# Patient Record
Sex: Female | Born: 1935 | Race: White | Hispanic: No | Smoking: Never smoker
Health system: Southern US, Community
[De-identification: ages and names within clinical notes are randomized; demographics above are authoritative.]

## PROBLEM LIST (undated history)

## (undated) DIAGNOSIS — H269 Unspecified cataract: Secondary | ICD-10-CM

## (undated) DIAGNOSIS — K469 Unspecified abdominal hernia without obstruction or gangrene: Secondary | ICD-10-CM

## (undated) DIAGNOSIS — K5790 Diverticulosis of intestine, part unspecified, without perforation or abscess without bleeding: Secondary | ICD-10-CM

## (undated) DIAGNOSIS — I10 Essential (primary) hypertension: Secondary | ICD-10-CM

## (undated) DIAGNOSIS — Z8719 Personal history of other diseases of the digestive system: Secondary | ICD-10-CM

## (undated) DIAGNOSIS — K219 Gastro-esophageal reflux disease without esophagitis: Secondary | ICD-10-CM

## (undated) HISTORY — PX: TONSILLECTOMY: SUR1361

## (undated) HISTORY — PX: BLADDER SUSPENSION: SHX72

## (undated) HISTORY — PX: ABDOMINAL HYSTERECTOMY: SHX81

## (undated) HISTORY — PX: EYE SURGERY: SHX253

---

## 2012-03-24 ENCOUNTER — Observation Stay (HOSPITAL_BASED_OUTPATIENT_CLINIC_OR_DEPARTMENT_OTHER)
Admission: EM | Admit: 2012-03-24 | Discharge: 2012-03-25 | Disposition: A | Discharge status: Home / Self Care | Payer: Medicare Other | Attending: Internal Medicine | Admitting: Internal Medicine

## 2012-03-24 ENCOUNTER — Encounter (HOSPITAL_BASED_OUTPATIENT_CLINIC_OR_DEPARTMENT_OTHER): Payer: Self-pay

## 2012-03-24 ENCOUNTER — Emergency Department (HOSPITAL_BASED_OUTPATIENT_CLINIC_OR_DEPARTMENT_OTHER): Payer: Medicare Other

## 2012-03-24 DIAGNOSIS — R61 Generalized hyperhidrosis: Secondary | ICD-10-CM

## 2012-03-24 DIAGNOSIS — R079 Chest pain, unspecified: Secondary | ICD-10-CM | POA: Diagnosis present

## 2012-03-24 DIAGNOSIS — K449 Diaphragmatic hernia without obstruction or gangrene: Secondary | ICD-10-CM | POA: Insufficient documentation

## 2012-03-24 DIAGNOSIS — R0789 Other chest pain: Principal | ICD-10-CM | POA: Insufficient documentation

## 2012-03-24 DIAGNOSIS — I10 Essential (primary) hypertension: Secondary | ICD-10-CM | POA: Insufficient documentation

## 2012-03-24 DIAGNOSIS — R110 Nausea: Secondary | ICD-10-CM | POA: Diagnosis present

## 2012-03-24 DIAGNOSIS — K219 Gastro-esophageal reflux disease without esophagitis: Secondary | ICD-10-CM | POA: Insufficient documentation

## 2012-03-24 DIAGNOSIS — R55 Syncope and collapse: Secondary | ICD-10-CM | POA: Insufficient documentation

## 2012-03-24 HISTORY — DX: Chest pain, unspecified: R07.9

## 2012-03-24 HISTORY — DX: Nausea: R11.0

## 2012-03-24 HISTORY — DX: Personal history of other diseases of the digestive system: Z87.19

## 2012-03-24 HISTORY — DX: Diverticulosis of intestine, part unspecified, without perforation or abscess without bleeding: K57.90

## 2012-03-24 HISTORY — DX: Essential (primary) hypertension: I10

## 2012-03-24 HISTORY — DX: Unspecified cataract: H26.9

## 2012-03-24 HISTORY — DX: Gastro-esophageal reflux disease without esophagitis: K21.9

## 2012-03-24 HISTORY — DX: Generalized hyperhidrosis: R61

## 2012-03-24 HISTORY — DX: Unspecified abdominal hernia without obstruction or gangrene: K46.9

## 2012-03-24 LAB — BASIC METABOLIC PANEL
BUN: 17 mg/dL (ref 6–23)
CO2: 27 mEq/L (ref 19–32)
Chloride: 105 mEq/L (ref 96–112)
Creatinine, Ser: 0.9 mg/dL (ref 0.50–1.10)

## 2012-03-24 LAB — CBC
HCT: 44.5 % (ref 36.0–46.0)
Hemoglobin: 14.8 g/dL (ref 12.0–15.0)
MCV: 89.9 fL (ref 78.0–100.0)
RBC: 4.95 MIL/uL (ref 3.87–5.11)
WBC: 5 10*3/uL (ref 4.0–10.5)

## 2012-03-24 LAB — TROPONIN I: Troponin I: <0.3 ng/mL

## 2012-03-24 IMAGING — CR DG CHEST 2V
2 series · 2 of 2 positions shown · non-contrast
Comparison: None.

CLINICAL DATA: Chest pain, nausea, syncope and diaphoresis.

CHEST - 2 VIEW

[w chest pa]
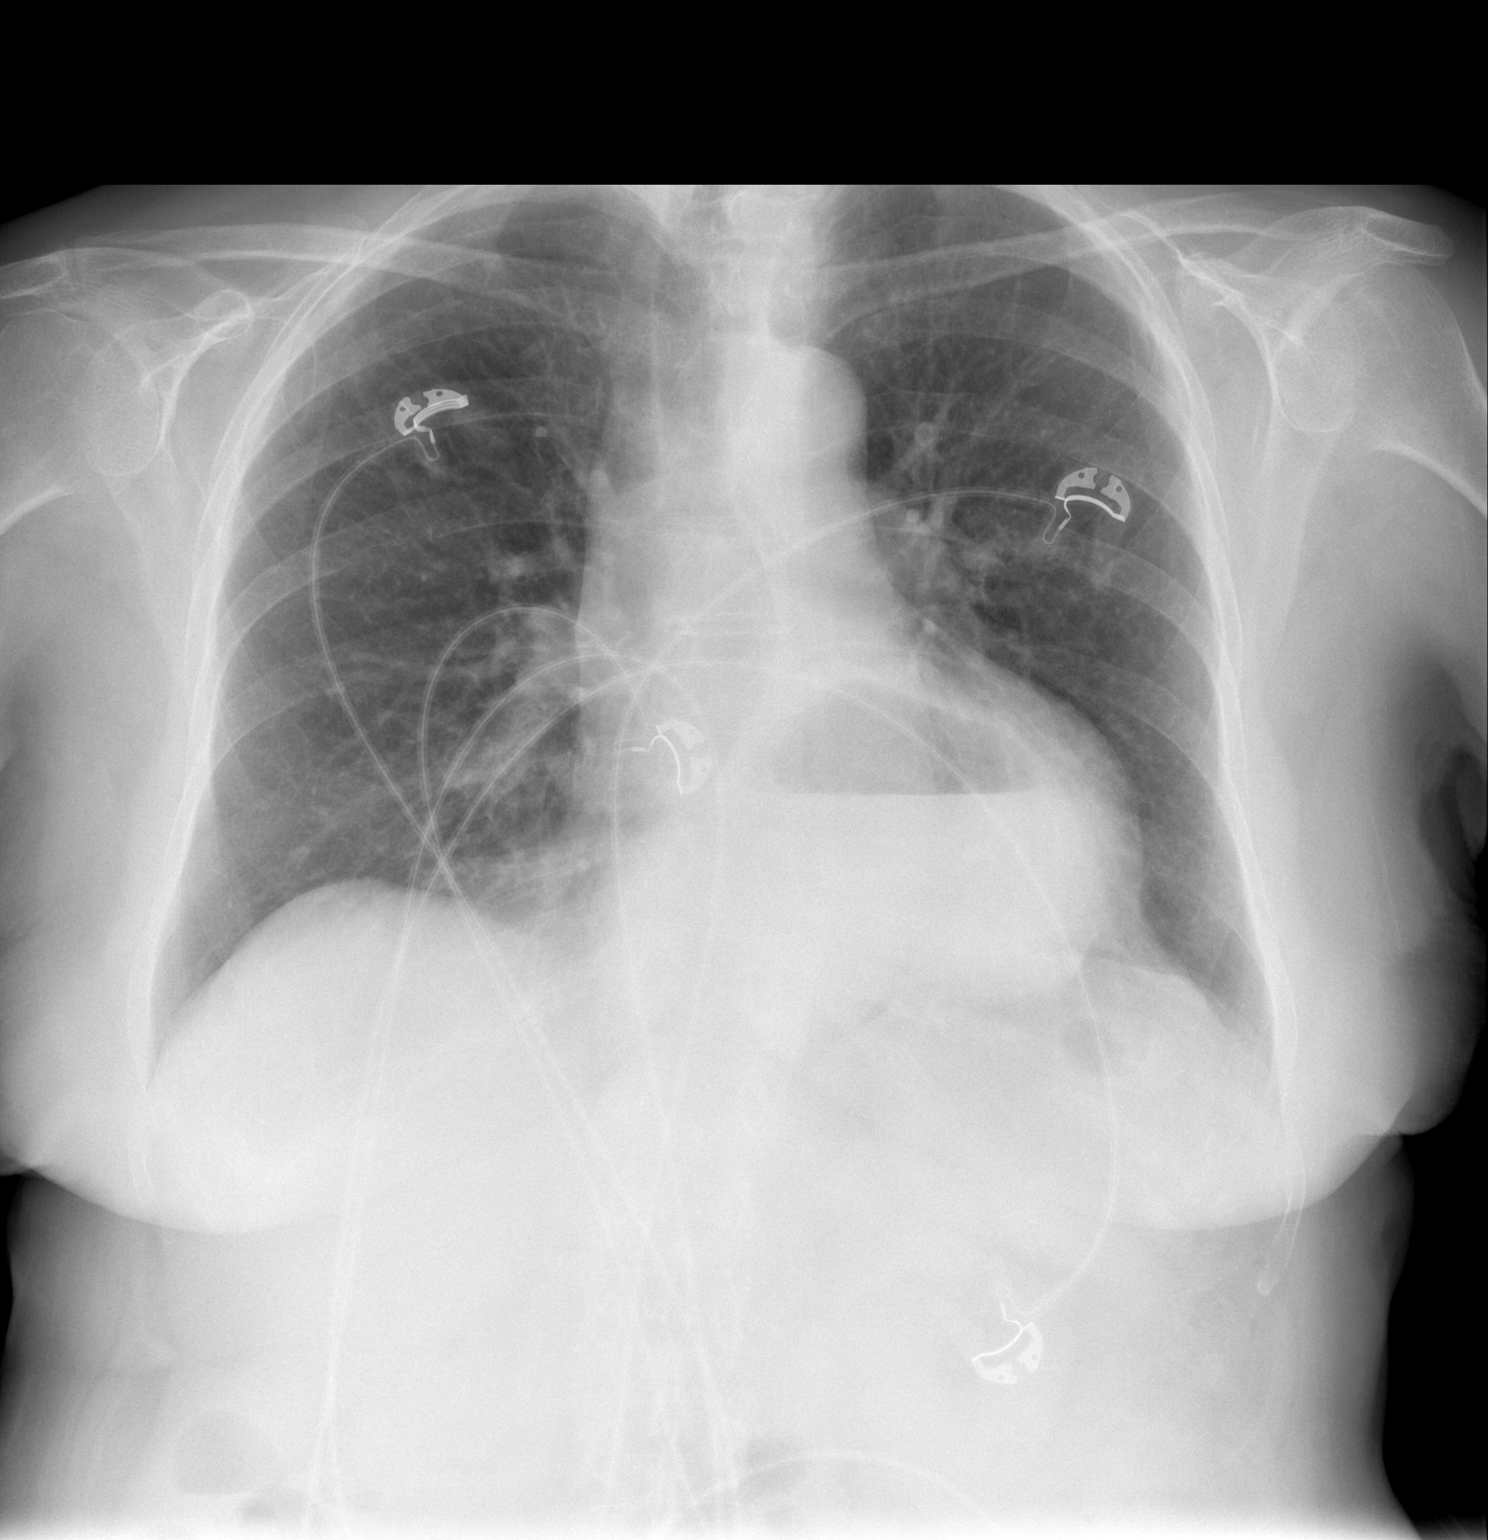

[w chest lat]
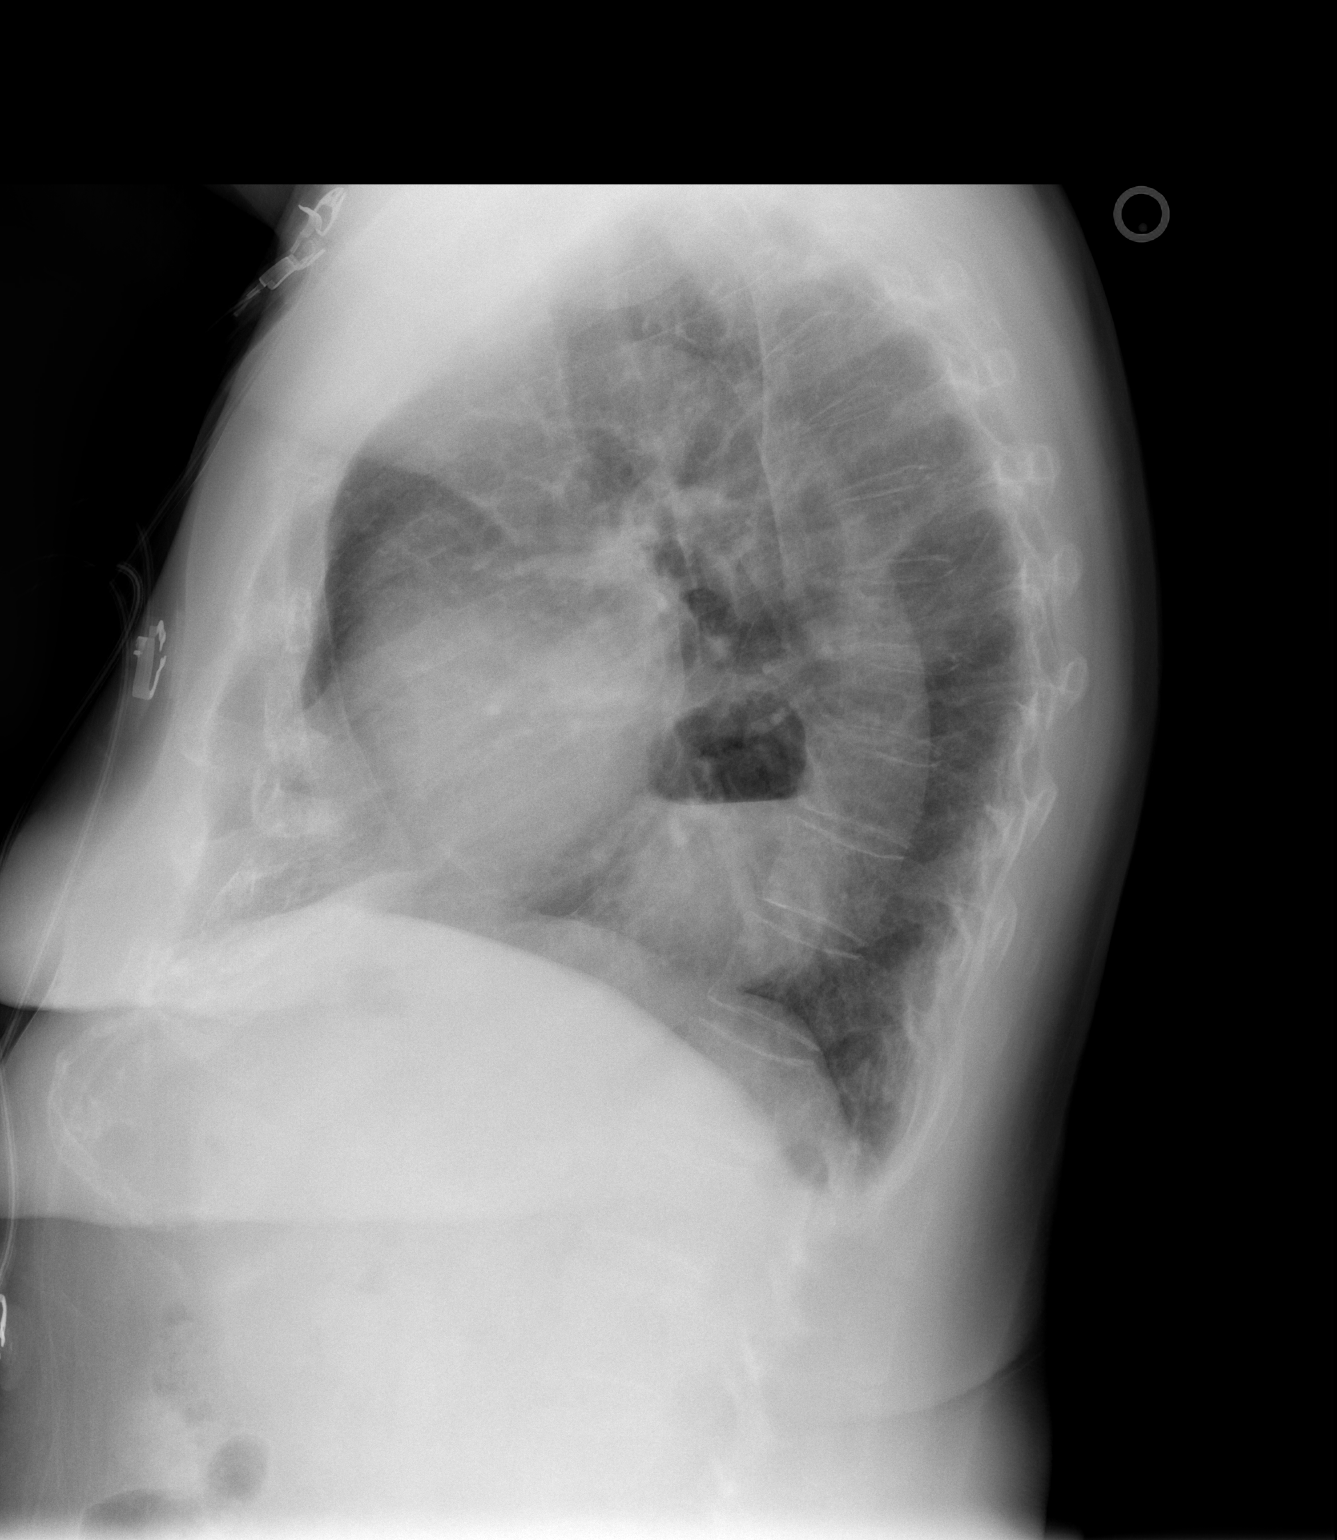

[2 of 2 positions shown; findings below may reference images not displayed]

FINDINGS: A very large hiatal hernia is present containing air
fluid levels.  Heart size is normal.  No edema, infiltrate or
pleural fluid is seen.  No pulmonary consolidation or nodules
identified.  Bony thorax is unremarkable.
IMPRESSION: Large hiatal hernia.  No active disease.

## 2012-03-24 MED ORDER — ASPIRIN EC 81 MG PO TBEC
81.0000 mg | DELAYED_RELEASE_TABLET | Freq: Every day | ORAL | Status: DC
  Administered 2012-03-25: 81 mg via ORAL
  Filled 2012-03-24 (×2): qty 1

## 2012-03-24 MED ORDER — ACETAMINOPHEN 325 MG PO TABS: 650.0000 mg | ORAL_TABLET | Freq: Four times a day (QID) | ORAL | Status: DC | PRN

## 2012-03-24 MED ORDER — PANTOPRAZOLE SODIUM 40 MG PO TBEC
40.0000 mg | DELAYED_RELEASE_TABLET | Freq: Every day | ORAL | Status: DC
  Administered 2012-03-25: 40 mg via ORAL
  Filled 2012-03-24: qty 1

## 2012-03-24 MED ORDER — ACETAMINOPHEN 650 MG RE SUPP: 650.0000 mg | Freq: Four times a day (QID) | RECTAL | Status: DC | PRN

## 2012-03-24 MED ORDER — ONDANSETRON HCL 4 MG PO TABS: 4.0000 mg | ORAL_TABLET | Freq: Four times a day (QID) | ORAL | Status: DC | PRN

## 2012-03-24 MED ORDER — ONDANSETRON HCL 4 MG/2ML IJ SOLN: 4.0000 mg | Freq: Four times a day (QID) | INTRAMUSCULAR | Status: DC | PRN

## 2012-03-24 MED ORDER — NITROGLYCERIN 0.4 MG SL SUBL: 0.4000 mg | SUBLINGUAL_TABLET | SUBLINGUAL | Status: DC | PRN

## 2012-03-24 MED ORDER — ENOXAPARIN SODIUM 40 MG/0.4ML ~~LOC~~ SOLN
40.0000 mg | SUBCUTANEOUS | Status: DC
  Administered 2012-03-24: 40 mg via SUBCUTANEOUS
  Filled 2012-03-24 (×2): qty 0.4

## 2012-03-24 NOTE — ED Notes (Signed)
Report called to [place], [place] [id number]

## 2012-03-24 NOTE — Consult Note (Signed)
Reason for Consult: Chest pain associated with diaphoresis Referring Physician: [facility] hospitalist  Stephanie Foster is an 79 y.o. female.  HPI: Patient is 79 year old female with past medical history significant for hypertension, GERD, had his hernia, history of diverticulosis, positive family history of coronary artery disease, was transferred from [facility] med center for evaluation of chest pain. Patient complains of vague chest pain described as sharp/tightness off and on lasting a few seconds to minutes associated with diaphoresis and not feeling good since last night. Patient denies any exertional chest pain denies history of PND orthopnea leg swelling also complains of occasional skipping of the heartbeat her denies any lightheadedness or syncope. EKG done in the ER showed normal sinus rhythm with poor R-wave progression in anteroseptal leads patient presently denies any chest pain states had the stress test approximately 5 years ago in [facility] which was normal. Denies any relation of chest pain to food breathing or movement.  Past Medical History  Diagnosis Date  . Hernia   . Cataract   . Diverticulosis   . Hypertension   . GERD (gastroesophageal reflux disease)   . H/O hiatal hernia     Past Surgical History  Procedure Date  . Tonsillectomy   . Bladder suspension   . Abdominal hysterectomy   . Eye surgery     Family History  Problem Relation Age of Onset  . Coronary artery disease Father   . Coronary artery disease Brother     Social History:  reports that she has never smoked. She has never used smokeless tobacco. She reports that she does not drink alcohol or use illicit drugs.  Allergies:  Allergies  Allergen Reactions  . Aspartame And Phenylalanine     "heart felt like swelling, top of head felt like blood pressure coming out of top"  . Codeine Nausea Only  . Diovan (Valsartan)     "makes me weak"  . Flagyl (Metronidazole) Diarrhea and Nausea And Vomiting  .  Macrobid (Nitrofurantoin Macrocrystal) Nausea Only  . Prednisone     "makes me feel so weak that I can't walk across the floor"  . Sulfa Antibiotics Nausea Only  . Vitasana (Actical)     "tired, arms and legs felt like going down"  . Aspirin Palpitations    Medications: I have reviewed the patient's current medications.  Results for orders placed during the hospital encounter of 03/24/12 (from the past 48 hour(s))  CBC     Status: Normal   Collection Time   03/24/12  9:20 AM      Component Value Range Comment   WBC 5.0  4.0 - 10.5 K/uL    RBC 4.95  3.87 - 5.11 MIL/uL    Hemoglobin 14.8  12.0 - 15.0 g/dL    HCT 16.1  09.6 - 04.5 %    MCV 89.9  78.0 - 100.0 fL    MCH 29.9  26.0 - 34.0 pg    MCHC 33.3  30.0 - 36.0 g/dL    RDW 40.9  81.1 - 91.4 %    Platelets 173  150 - 400 K/uL   BASIC METABOLIC PANEL     Status: Abnormal   Collection Time   03/24/12  9:20 AM      Component Value Range Comment   Sodium 143  135 - 145 mEq/L    Potassium 3.8  3.5 - 5.1 mEq/L    Chloride 105  96 - 112 mEq/L    CO2 27  19 -  32 mEq/L    Glucose, Bld 94  70 - 99 mg/dL    BUN 17  6 - 23 mg/dL    Creatinine, Ser 3.08  0.50 - 1.10 mg/dL    Calcium 9.4  8.4 - 65.7 mg/dL    GFR calc non Af Amer 57 (*) >90 mL/min    GFR calc Af Amer 66 (*) >90 mL/min   TROPONIN I     Status: Normal   Collection Time   03/24/12  9:20 AM      Component Value Range Comment   Troponin I <0.30  <0.30 ng/mL   TROPONIN I     Status: Normal   Collection Time   03/24/12  3:20 PM      Component Value Range Comment   Troponin I <0.30  <0.30 ng/mL     Dg Chest 2 View  03/24/2012  *RADIOLOGY REPORT*  Clinical Data: Chest pain, nausea, syncope and diaphoresis.  CHEST - 2 VIEW  Comparison: None.  Findings: A very large hiatal hernia is present containing air fluid levels.  Heart size is normal.  No edema, infiltrate or pleural fluid is seen.  No pulmonary consolidation or nodules identified.  Bony thorax is unremarkable.  IMPRESSION:  Large hiatal hernia.  No active disease.   Original Report Authenticated By: Irish Lack, M.D.     Review of Systems  Constitutional: Negative for fever and chills.  Eyes: Negative for blurred vision and double vision.  Respiratory: Negative for cough, hemoptysis and sputum production.   Cardiovascular: Positive for chest pain and palpitations. Negative for orthopnea, claudication and leg swelling.  Gastrointestinal: Positive for nausea. Negative for vomiting and abdominal pain.  Genitourinary: Negative for dysuria.  Skin: Negative for rash.  Neurological: Negative for dizziness and headaches.   Blood pressure 105/63, pulse 63, temperature 97.9 F (36.6 C), temperature source Oral, resp. rate 18, height 5' (1.524 m), weight 65.318 kg (144 lb), SpO2 93.00%. Physical Exam  Constitutional: She is oriented to person, place, and time. She appears well-nourished.  HENT:  Head: Normocephalic and atraumatic.  Eyes: Conjunctivae normal and EOM are normal. Left eye exhibits no discharge. No scleral icterus.  Neck: Neck supple. No JVD present. No tracheal deviation present. No thyromegaly present.  Cardiovascular: Normal rate and regular rhythm.   Murmur (Soft systolic murmur noted no S3 gallop) heard. Respiratory: Effort normal and breath sounds normal. No respiratory distress. She has no wheezes. She has no rales. She exhibits no tenderness.  GI: Soft. Bowel sounds are normal. She exhibits no distension. There is no tenderness. There is no rebound.  Musculoskeletal: She exhibits no edema and no tenderness.  Neurological: She is alert and oriented to person, place, and time.    Assessment/Plan: Atypical chest pain/associated with diaphoresis rule out MI Hypertension GERD Had this hernia History of diverticulosis Positive family history of coronary artery disease Plan Start aspirin 81 mg daily And PPI Nitrostat sublingual when necessary Will schedule for nuclear stress test in  a.m. Will hold beta blockers in view of bradycardia Stephanie Foster N 03/24/2012, 9:32 PM

## 2012-03-24 NOTE — ED Notes (Signed)
Patient transported to X-ray 

## 2012-03-24 NOTE — H&P (Signed)
Patient's PCP: Garth Schlatter, MD  Chief Complaint: Chest pain  History of Present Illness: Stephanie Foster is a 79 y.o. Caucasian female with no significant past medical history except hiatal hernia, diverticulosis, and hypertension not on any antihypertensive medications at this time who presents with the above complaints.  She reports that she intermittently has episodes of left-sided chest pain but briefly last for a few seconds which then resolves.  She had an episode of chest pain this morning at 3 a.m.  She woke up this morning and had breakfast at 8 a.m. and subsequently after that she had diaphoresis, nausea and near syncope.  She had the symptoms for about 10-15 minutes before they resolved.  She presented to the emergency department for further evaluation.  Has not had any recent fevers, chills, vomiting, shortness of breath, abdominal pain, diarrhea, headaches and vision changes.  Review of Systems: All systems reviewed with the patient and positive as per history of present illness, otherwise all other systems are negative.  Past Medical History  Diagnosis Date  . Hernia   . Cataract   . Diverticulosis   . Hypertension   . GERD (gastroesophageal reflux disease)   . H/O hiatal hernia    Past Surgical History  Procedure Date  . Tonsillectomy   . Bladder suspension   . Abdominal hysterectomy   . Eye surgery    Family History  Problem Relation Age of Onset  . Coronary artery disease Father   . Coronary artery disease Brother    History   Social History  . Marital Status: Married    Spouse Name: N/A    Number of Children: N/A  . Years of Education: N/A   Occupational History  . Not on file.   Social History Main Topics  . Smoking status: Never Smoker   . Smokeless tobacco: Never Used  . Alcohol Use: No  . Drug Use: No  . Sexually Active:    Other Topics Concern  . Not on file   Social History Narrative  . No narrative on file   Allergies: Aspartame  and phenylalanine; Codeine; Diovan; Flagyl; Macrobid; Prednisone; Sulfa antibiotics; Vitasana; and Aspirin  Home Meds: Prior to Admission medications   Medication Sig Start Date End Date Taking? Authorizing Provider  OVER THE COUNTER MEDICATION Take 1 capsule by mouth 2 (two) times daily. Total Cardio Coverage  Purchase by mail   Yes Historical Provider, MD  OVER THE COUNTER MEDICATION Take 2 Wafers by mouth at bedtime. For nervousness    Purchases  By mail  Called [facility] and anxiety   Yes Historical Provider, MD  Multiple Vitamin (MULTIVITAMIN) tablet Take 1 tablet by mouth daily.   Yes Historical Provider, MD    Physical Exam: Blood pressure 126/75, pulse 71, temperature 97.7 F (36.5 C), temperature source Oral, resp. rate 14, height 5' (1.524 m), weight 65.318 kg (144 lb), SpO2 96.00%. General: Awake, Oriented x3, No acute distress. HEENT: EOMI, Moist mucous membranes Neck: Supple CV: S1 and S2 Lungs: Clear to ascultation bilaterally Abdomen: Soft, Nontender, Nondistended, +bowel sounds. Ext: Good pulses. Trace edema. No clubbing or cyanosis noted. Neuro: Cranial Nerves II-XII grossly intact. Has 5/5 motor strength in upper and lower extremities.   Lab results:  [facility] 03/24/12 0920  NA 143  K 3.8  CL 105  CO2 27  GLUCOSE 94  BUN 17  CREATININE 0.90  CALCIUM 9.4  MG --  PHOS --   No results found for this basename: AST:2,ALT:2,ALKPHOS:2,BILITOT:2,PROT:2,ALBUMIN:2  in the last 72 hours No results found for this basename: LIPASE:2,AMYLASE:2 in the last 72 hours  Basename 03/24/12 0920  WBC 5.0  NEUTROABS --  HGB 14.8  HCT 44.5  MCV 89.9  PLT 173    Basename 03/24/12 0920  CKTOTAL --  CKMB --  CKMBINDEX --  TROPONINI <0.30   No components found with this basename: POCBNP:3 No results found for this basename: DDIMER in the last 72 hours No results found for this basename: HGBA1C:2 in the last 72 hours No results found for this basename:  CHOL:2,HDL:2,LDLCALC:2,TRIG:2,CHOLHDL:2,LDLDIRECT:2 in the last 72 hours No results found for this basename: TSH,T4TOTAL,FREET3,T3FREE,THYROIDAB in the last 72 hours No results found for this basename: VITAMINB12:2,FOLATE:2,FERRITIN:2,TIBC:2,IRON:2,RETICCTPCT:2 in the last 72 hours Imaging results:  Dg Chest 2 View  03/24/2012  *RADIOLOGY REPORT*  Clinical Data: Chest pain, nausea, syncope and diaphoresis.  CHEST - 2 VIEW  Comparison: None.  Findings: A very large hiatal hernia is present containing air fluid levels.  Heart size is normal.  No edema, infiltrate or pleural fluid is seen.  No pulmonary consolidation or nodules identified.  Bony thorax is unremarkable.  IMPRESSION: Large hiatal hernia.  No active disease.   Original Report Authenticated By: Irish Lack, M.D.    Other results: EKG: Normal sinus rhythm with heart rate of 88, with PVCs.  Assessment & Plan by Problem: Chest pain Admit the patient to telemetry.  Cycle cardiac enzymes and rule the patient out for acute coronary syndrome.  Check lipid panel in the morning.  Given patient's age, requested cardiology, Dr. Sharyn Lull, consult for further evaluation.  Patient has an allergy to aspirin.  Nausea/diaphoresis Resolved.  Prophylaxis Lovenox.  CODE STATUS Full code.  This was discussed with the patient at the time of admission.  Disposition Admit the patient to telemetry as observation.  Time spent on admission, talking to the patient, and coordinating care was: 60 mins.  Murdock Jellison A, MD 03/24/2012, 2:44 PM

## 2012-03-24 NOTE — ED Provider Notes (Signed)
History     CSN: [id number]  Arrival date & time 03/24/12  1308   First MD Initiated Contact with Patient 03/24/12 0915      Chief Complaint  Patient presents with  . Chest Pain  . Nausea    (Consider location/radiation/quality/duration/timing/severity/associated sxs/prior treatment) HPI Pt presents with c/o episode of chest pain early this morning just after getting out of bed. She describes this as a tightness, heaviness and tingling in her left chest.  This resoled after a couple of minutes.  Then after eating breakfast when she was walking across her house she had episode of nausea, diaphoresis and near syncope.  No fever/chills, no syncope. No leg swelling, no hx of DVT/PE, no recent travel/trauma/surgery.  She denies having had a stress test in the past.  There are no other associated systemic symptoms, there are no other alleviating or modifying factors.   Past Medical History  Diagnosis Date  . Hernia   . Cataract   . Diverticulosis   . Hypertension   . GERD (gastroesophageal reflux disease)   . H/O hiatal hernia     Past Surgical History  Procedure Date  . Tonsillectomy   . Bladder suspension   . Abdominal hysterectomy   . Eye surgery     Family History  Problem Relation Age of Onset  . Coronary artery disease Father   . Coronary artery disease Brother     History  Substance Use Topics  . Smoking status: Never Smoker   . Smokeless tobacco: Never Used  . Alcohol Use: No    OB History    Grav Para Term Preterm Abortions TAB SAB Ect Mult Living                  Review of Systems ROS reviewed and all otherwise negative except for mentioned in HPI  Allergies  Aspartame and phenylalanine; Codeine; Diovan; Flagyl; Macrobid; Prednisone; Sulfa antibiotics; Vitasana; and Aspirin  Home Medications  No current outpatient prescriptions on file.  BP 126/75  Pulse 71  Temp 97.7 F (36.5 C) (Oral)  Resp 14  Ht 5' (1.524 m)  Wt 144 lb (65.318 kg)  BMI  28.12 kg/m2  SpO2 96% Vitals reivewed Physical Exam Physical Examination: General appearance - alert, well appearing, and in no distress Mental status - alert, oriented to person, place, and time Eyes - no scleral icterus, no conjunctival injection Mouth - mucous membranes moist, pharynx normal without lesions Chest - clear to auscultation, no wheezes, rales or rhonchi, symmetric air entry, nontender Heart - normal rate, regular rhythm, normal S1, S2, no murmurs, rubs, clicks or gallops Abdomen - soft, nontender, nondistended, no masses or organomegaly Extremities - peripheral pulses normal, no pedal edema, no clubbing or cyanosis Skin - normal coloration and turgor, no rashes  ED Course  Procedures (including critical care time)   Date: 03/24/2012  Rate: 88  Rhythm: normal sinus rhythm with PACs and PVCs  QRS Axis: left  Intervals: normal  ST/T Wave abnormalities: nonspecific T wave changes  Conduction Disutrbances:none  Narrative Interpretation:   Old EKG Reviewed: none available  11:15 AM d/w [facility], pt to go to telemetry team 10 at [facility] Reviewed  BASIC METABOLIC PANEL - Abnormal; Notable for the following:    GFR calc non Af Amer 57 (*)     GFR calc Af Amer 66 (*)     All other components within normal limits  CBC  TROPONIN I  TROPONIN I  TROPONIN I   Dg Chest 2 View  03/24/2012  *RADIOLOGY REPORT*  Clinical Data: Chest pain, nausea, syncope and diaphoresis.  CHEST - 2 VIEW  Comparison: None.  Findings: A very large hiatal hernia is present containing air fluid levels.  Heart size is normal.  No edema, infiltrate or pleural fluid is seen.  No pulmonary consolidation or nodules identified.  Bony thorax is unremarkable.  IMPRESSION: Large hiatal hernia.  No active disease.   Original Report Authenticated By: Irish Lack, M.D.      1. Chest pain   2. Near syncope       MDM  Pt presenting with c/o chest pain as well as diaphoresis, nausea and near  syncope.  EKG and troponin are reassuring.  Pt is chest pain free in the ED.  Low suspicion for PE or CHF.  Pt to be admitted for cardiac/ACS workup.  [facility] is [facility]able [facility].          Ethelda Chick, MD 03/24/12 [phone]

## 2012-03-24 NOTE — ED Notes (Signed)
Pt reports she had an episode of chest pain after eating this am.  Pain is described as a tingling feeling on left chest wall associated with diaphoresis, nausea and near syncope.

## 2012-03-24 NOTE — ED Notes (Signed)
[facility] at bedside preparing for transport 

## 2012-03-24 NOTE — ED Notes (Signed)
Report called to [facility] °

## 2012-03-24 NOTE — ED Notes (Signed)
MD at bedside. 

## 2012-03-24 NOTE — ED Notes (Signed)
VSS. Pt denies pain.  Awaiting admission to [facility].  Pt and son informed of plan of care.

## 2012-03-24 NOTE — Progress Notes (Signed)
Received call from [facility]/ Dr Karma Ganja re: Minerva Ends for admission for chest pain.   79 year old lady with no significant past medical history came in to [facility] med center for substernal chest pain associated with some nausea, diaphoresis and an episode of near syncope in ED.  Her vitals on board appear to be stable, first set of troponin negative, EKG does not show any ischemic changes, CXR shows a large hiatal hernia without any consolidation. She is not having active chest pain.   She will be admitted to telemetry to evaluate for ACS.   Kathlen Mody, MD [phone]

## 2012-03-25 ENCOUNTER — Observation Stay (HOSPITAL_COMMUNITY): Payer: Medicare Other

## 2012-03-25 DIAGNOSIS — R110 Nausea: Secondary | ICD-10-CM

## 2012-03-25 LAB — LIPID PANEL
Cholesterol: 194 mg/dL (ref 0–200)
Total CHOL/HDL Ratio: 2.4 RATIO
Triglycerides: 73 mg/dL (ref <150)
VLDL: 15 mg/dL (ref 0–40)

## 2012-03-25 MED ORDER — TECHNETIUM TC 99M SESTAMIBI GENERIC - CARDIOLITE
30.0000 | Freq: Once | INTRAVENOUS | Status: AC | PRN
  Administered 2012-03-25: 30 via INTRAVENOUS

## 2012-03-25 MED ORDER — TECHNETIUM TC 99M SESTAMIBI GENERIC - CARDIOLITE
10.0000 | Freq: Once | INTRAVENOUS | Status: AC | PRN
  Administered 2012-03-25: 10 via INTRAVENOUS

## 2012-03-25 MED ORDER — PANTOPRAZOLE SODIUM 40 MG PO TBEC: 40.0000 mg | DELAYED_RELEASE_TABLET | Freq: Every day | ORAL | Status: DC

## 2012-03-25 MED ORDER — REGADENOSON 0.4 MG/5ML IV SOLN
0.4000 mg | Freq: Once | INTRAVENOUS | Status: AC
  Administered 2012-03-25: 0.4 mg via INTRAVENOUS
  Filled 2012-03-25: qty 5

## 2012-03-25 NOTE — Progress Notes (Signed)
Subjective:  Chest pain shortness of breath or diaphoresis. Seen a nuclear medicine Department. Tolerated Lexi scan stress test. Nuclear images are still pending  Objective:  Vital Signs in the last 24 hours: Temp:  [97.5 F (36.4 C)-97.9 F (36.6 C)] 97.5 F (36.4 C) (02/08 0500) Pulse Rate:  [63-100] 89 (02/08 1047) Resp:  [14-18] 18 (02/08 0500) BP: (105-168)/(63-81) 135/68 mmHg (02/08 1047) SpO2:  [93 %-96 %] 95 % (02/08 0500) Weight:  [63.73 kg (140 lb 8 oz)-65.318 kg (144 lb)] 63.73 kg (140 lb 8 oz) (02/08 0500)  Intake/Output from previous day: 02/07 0701 - 02/08 0700 In: -  Out: 900 [Urine:900] Intake/Output from this shift:    Physical Exam: Neck: no adenopathy, no carotid bruit, no JVD and supple, symmetrical, trachea midline Lungs: clear to auscultation bilaterally Heart: regular rate and rhythm, S1, S2 normal and Soft systolic murmur noted no S3 gallop Abdomen: soft, non-tender; bowel sounds normal; no masses,  no organomegaly Extremities: extremities normal, atraumatic, no cyanosis or edema  Lab Results:  Recent Labs  03/24/12 0920  WBC 5.0  HGB 14.8  PLT 173    Recent Labs  03/24/12 0920  NA 143  K 3.8  CL 105  CO2 27  GLUCOSE 94  BUN 17  CREATININE 0.90    Recent Labs  03/24/12 1520 03/24/12 2107  TROPONINI <0.30 <0.30   Hepatic Function Panel No results found for this basename: PROT, ALBUMIN, AST, ALT, ALKPHOS, BILITOT, BILIDIR, IBILI,  in the last 72 hours  Recent Labs  03/25/12 0538  CHOL 194   No results found for this basename: PROTIME,  in the last 72 hours  Imaging: Imaging results have been reviewed and Dg Chest 2 View  03/24/2012  *RADIOLOGY REPORT*  Clinical Data: Chest pain, nausea, syncope and diaphoresis.  CHEST - 2 VIEW  Comparison: None.  Findings: A very large hiatal hernia is present containing air fluid levels.  Heart size is normal.  No edema, infiltrate or pleural fluid is seen.  No pulmonary consolidation or  nodules identified.  Bony thorax is unremarkable.  IMPRESSION: Large hiatal hernia.  No active disease.   Original Report Authenticated By: Irish Lack, M.D.     Cardiac Studies:  Assessment/Plan:  Status post Atypical chest pain/associated with diaphoresis MI ruled out Hypertension  GERD  Had this hernia  History of diverticulosis  Positive family history of coronary artery disease Plan Continue present management Okay to discharge from cardiac point of view if nuclear scan is negative for reversible ischemia  LOS: 1 day    Stephanie Foster N 03/25/2012, 11:03 AM

## 2012-03-25 NOTE — Discharge Summary (Signed)
Physician Discharge Summary  Stephanie Foster YQM:[id number] DOB: 07-23-1924 DOA: 03/24/2012  PCP: Garth Schlatter, MD  Admit date: 03/24/2012 Discharge date: 03/25/2012  Time spent: 25 minutes  Recommendations for Outpatient Follow-up:  Home with PCP follow up in 1 week  Discharge Diagnoses:  Principal Problem:   Chest pain, atypical   Active Problems:   Nausea   Diaphoresis Hiatal hernia   Discharge Condition: fair  Diet recommendation regular  Filed Weights   03/24/12 0905 03/24/12 1349 03/25/12 0500  Weight: 63.504 kg (140 lb) 65.318 kg (144 lb) 63.73 kg (140 lb 8 oz)    History of present illness:  Please refer to H&P for  details but in brief, 79 y.o. Caucasian female with no significant past medical history except hiatal hernia, diverticulosis, and hypertension not on any antihypertensive medications at this time who reports that she intermittently has episodes of left-sided chest pain but briefly last for a few seconds which then resolves. She had an episode of chest pain on the morning of admission. She woke up this morning and had breakfast at 8 a.m. and subsequently after that she had diaphoresis, nausea and near syncope. She had the symptoms for about 10-15 minutes before they resolved. She presented to the emergency department for further evaluation   Hospital Course:  Patient admitted under observation for chest pain rule out. EKG and serial troponin were negative. Patient monitored on telemetry which was unremarkable. Placed her on ASA and s/l nitro.  Given positive cardiac hx cardiology was consulted who recommended for a stress test. patient had a nuclear stress test today which was negative for reversble  ischemia or wall motion abnormality with an EF of 63%. patient remains pain free and liekly associated with GERD symptoms and her hiatal hernia. Will prescribe her a short course of PPI.  Can be dced home with outpt follow up with PCP.   Procedures:  NUCLEAR  STRESS TEST  Consultations:  Dr Sharyn Lull  Discharge Exam: Filed Vitals:   03/25/12 1040 03/25/12 1042 03/25/12 1045 03/25/12 1047  BP: 168/66 132/67 136/64 135/68  Pulse: 100 98 90 89  Temp:      TempSrc:      Resp:      Height:      Weight:      SpO2:        General: Elderly female in NAD HEENT: no pallor, moist oral mucosa Cardiovascular: NS1 &S2, no murmurs, rubs or gallop Respiratory: clear b/l, no added sounds Abd: soft, NT, ND, BS+ Ext: warm, no edema  CNS: AAOX3  Discharge Instructions     Medication List    TAKE these medications       multivitamin tablet  Take 1 tablet by mouth daily.     OVER THE COUNTER MEDICATION  Take 1 capsule by mouth 2 (two) times daily. Total Cardio Coverage  Purchase by mail     OVER THE COUNTER MEDICATION  Take 2 Wafers by mouth at bedtime. For nervousness    Purchases  By mail  Called [facility]     pantoprazole 40 MG tablet  Commonly known as:  PROTONIX  Take 1 tablet (40 mg total) by mouth daily with breakfast.          The results of significant diagnostics from this hospitalization (including imaging, microbiology, ancillary and laboratory) are listed below for reference.    Significant Diagnostic Studies: Dg Chest 2 View  03/24/2012  *RADIOLOGY REPORT*  Clinical Data: Chest pain, nausea,  syncope and diaphoresis.  CHEST - 2 VIEW  Comparison: None.  Findings: A very large hiatal hernia is present containing air fluid levels.  Heart size is normal.  No edema, infiltrate or pleural fluid is seen.  No pulmonary consolidation or nodules identified.  Bony thorax is unremarkable.  IMPRESSION: Large hiatal hernia.  No active disease.   Original Report Authenticated By: Irish Lack, M.D.    Nm Myocar Multi W/spect W/wall Motion / Ef  03/25/2012  *RADIOLOGY REPORT*  Clinical Data:  79 year old female with chest pain  MYOCARDIAL IMAGING WITH SPECT (REST AND PHARMACOLOGIC-STRESS) GATED LEFT VENTRICULAR WALL MOTION STUDY  LEFT VENTRICULAR EJECTION FRACTION  Technique:  Resting myocardial SPECT imaging was initially performed after intravenous administration of radiopharmaceutical. Myocardial SPECT was subsequently performed after additional radiopharmaceutical injection during pharmacologic-stress supervised by the Cardiology staff.  Quantitative gated imaging was also performed to evaluate left ventricular wall motion, and estimate left ventricular ejection fraction.  Radiopharmaceutical:  Tc-61m cardiolite at rest and during stress.  Comparison: none  Findings:  Technique: Study is adequate.  Perfusion: The heart is small.  There are no relative decreased counts on stress or rest to suggest reversible ischemia or infarction.  Wall motion:  No focal wall motion abnormality.  Normal contractility.  Left ventricular ejection fraction:  Calculated left ventricular ejection fraction =  63%  IMPRESSION:  1.  No reversible ischemia or infarction. 2.  Normal wall motion. 3.  Left ventricular ejection fraction equal 63%   Original Report Authenticated By: Genevive Bi, M.D.     Microbiology: No results found for this or any previous visit (from the past 240 hour(s)).   Labs: Basic Metabolic Panel:  Recent Labs Lab 03/24/12 0920  NA 143  K 3.8  CL 105  CO2 27  GLUCOSE 94  BUN 17  CREATININE 0.90  CALCIUM 9.4   Liver Function Tests: No results found for this basename: AST, ALT, ALKPHOS, BILITOT, PROT, ALBUMIN,  in the last 168 hours No results found for this basename: LIPASE, AMYLASE,  in the last 168 hours No results found for this basename: AMMONIA,  in the last 168 hours CBC:  Recent Labs Lab 03/24/12 0920  WBC 5.0  HGB 14.8  HCT 44.5  MCV 89.9  PLT 173   Cardiac Enzymes:  Recent Labs Lab 03/24/12 0920 03/24/12 1520 03/24/12 2107  TROPONINI <0.30 <0.30 <0.30   BNP: BNP (last 3 results) No results found for this basename: PROBNP,  in the last 8760 hours CBG: No results found  for this basename: GLUCAP,  in the last 168 hours     Signed:  Vendetta Pittinger  [facility] 03/25/2012, 1:08 PM

## 2015-12-14 ENCOUNTER — Emergency Department (HOSPITAL_BASED_OUTPATIENT_CLINIC_OR_DEPARTMENT_OTHER)
Admission: EM | Admit: 2015-12-14 | Discharge: 2015-12-14 | Disposition: A | Discharge status: Home / Self Care | Payer: Medicare Other | Attending: Emergency Medicine | Admitting: Emergency Medicine

## 2015-12-14 ENCOUNTER — Encounter (HOSPITAL_BASED_OUTPATIENT_CLINIC_OR_DEPARTMENT_OTHER): Payer: Self-pay | Admitting: Emergency Medicine

## 2015-12-14 DIAGNOSIS — I10 Essential (primary) hypertension: Secondary | ICD-10-CM | POA: Diagnosis not present

## 2015-12-14 DIAGNOSIS — R300 Dysuria: Secondary | ICD-10-CM | POA: Insufficient documentation

## 2015-12-14 LAB — URINALYSIS, ROUTINE W REFLEX MICROSCOPIC
Bilirubin Urine: NEGATIVE
GLUCOSE, UA: NEGATIVE mg/dL
HGB URINE DIPSTICK: NEGATIVE
Ketones, ur: NEGATIVE mg/dL
Leukocytes, UA: NEGATIVE
Nitrite: NEGATIVE
PH: 6 (ref 5.0–8.0)
PROTEIN: NEGATIVE mg/dL
Specific Gravity, Urine: 1.003 — ABNORMAL LOW (ref 1.005–1.030)

## 2015-12-14 NOTE — ED Provider Notes (Signed)
[facility] Provider Note   CSN: [id number] Arrival date & time: 12/14/15  1110     History   Chief Complaint Chief Complaint  Patient presents with  . Dysuria    HPI Stephanie Foster is a 82 y.o. female With a past medical history significant for diverticulitis and GERD who presents with dysuria. Patient reports that she had a urinary tract infection many years ago with dysuria and thinks this feel similar. She said she said dysuria for the last few days but denies any change in urine appearance, smell, amount, and denies any abdominal pain. She denies any vaginal discharge or bleeding. She denies any recent trauma to her back, flanks, or abdomen. She denies any falls. She denies fevers, chills, chest pain, shortness of breath, nausea, vomiting. She denies any other symptoms. She reports that she took a pill of Cipro yesterday from a friend when she thought she was feeling dysuria.  Dysuria   This is a new problem. The current episode started yesterday. The problem occurs intermittently. The problem has not changed since onset.The quality of the pain is described as burning. The pain is at a severity of 0/10. The patient is experiencing no pain. There has been no fever. Pertinent negatives include no chills, no sweats, no nausea, no vomiting, no discharge, no frequency, no hematuria, no hesitancy, no urgency and no flank pain. She has tried nothing for the symptoms. Her past medical history does not include kidney stones, single kidney or urological procedure.    Past Medical History:  Diagnosis Date  . Cataract   . Diverticulosis   . GERD (gastroesophageal reflux disease)   . H/O hiatal hernia   . Hernia   . Hypertension     Patient Active Problem List   Diagnosis Date Noted  . Chest pain 03/24/2012  . Nausea 03/24/2012  . Diaphoresis 03/24/2012    Past Surgical History:  Procedure Laterality Date  . ABDOMINAL HYSTERECTOMY    . BLADDER SUSPENSION    . EYE  SURGERY    . TONSILLECTOMY      OB History    No data available       Home Medications    Prior to Admission medications   Medication Sig Start Date End Date Taking? Authorizing Provider  Multiple Vitamin (MULTIVITAMIN) tablet Take 1 tablet by mouth daily.    Historical Provider, MD  OVER THE COUNTER MEDICATION Take 1 capsule by mouth 2 (two) times daily. Total Cardio Coverage  Purchase by mail    Historical Provider, MD  OVER THE COUNTER MEDICATION Take 2 Wafers by mouth at bedtime. For nervousness    Purchases  By mail  Called [facility]    Historical Provider, MD  pantoprazole (PROTONIX) 40 MG tablet Take 1 tablet (40 mg total) by mouth daily with breakfast. 03/25/12   Eddie North, MD    Family History Family History  Problem Relation Age of Onset  . Coronary artery disease Father   . Coronary artery disease Brother     Social History Social History  Substance Use Topics  . Smoking status: Never Smoker  . Smokeless tobacco: Never Used  . Alcohol use No     Allergies   Aspartame and phenylalanine; Codeine; Diovan [valsartan]; Flagyl [metronidazole]; Macrobid [nitrofurantoin macrocrystal]; Prednisone; Sulfa antibiotics; Vitasana [actical]; and Aspirin   Review of Systems Review of Systems  Constitutional: Negative for activity change, chills, diaphoresis, fatigue and fever.  HENT: Negative for congestion and rhinorrhea.  Eyes: Negative for visual disturbance.  Respiratory: Negative for cough, chest tightness, shortness of breath and stridor.   Cardiovascular: Negative for chest pain, palpitations and leg swelling.  Gastrointestinal: Negative for abdominal distention, abdominal pain, constipation, diarrhea, nausea and vomiting.  Genitourinary: Positive for dysuria. Negative for decreased urine volume, difficulty urinating, flank pain, frequency, hematuria, hesitancy, menstrual problem, pelvic pain, urgency, vaginal bleeding and vaginal discharge.   Musculoskeletal: Negative for back pain and neck pain.  Skin: Negative for rash and wound.  Neurological: Negative for dizziness, weakness, light-headedness, numbness and headaches.  Psychiatric/Behavioral: Negative for agitation and confusion.  All other systems reviewed and are negative.    Physical Exam Updated Vital Signs BP 158/83 (BP Location: Left Arm)   Pulse 80   Temp 97.8 F (36.6 C) (Oral)   Resp 16   Ht 5' (1.524 m)   Wt 142 lb (64.4 kg)   SpO2 97%   BMI 27.73 kg/m   Physical Exam  Constitutional: She appears well-developed and well-nourished. No distress.  HENT:  Head: Normocephalic and atraumatic.  Mouth/Throat: Oropharynx is clear and moist. No oropharyngeal exudate.  Eyes: Conjunctivae are normal.  Neck: Neck supple.  Cardiovascular: Normal rate and regular rhythm.   No murmur heard. Pulmonary/Chest: Effort normal and breath sounds normal. No respiratory distress. She has no wheezes. She exhibits no tenderness.  Abdominal: Soft. There is no tenderness.  Musculoskeletal: She exhibits no edema or tenderness.  Neurological: She is alert. She exhibits normal muscle tone.  Skin: Skin is warm and dry.  Psychiatric: She has a normal mood and affect.  Nursing note and vitals reviewed.    ED Treatments / Results  Labs (all labs ordered are listed, but only abnormal results are displayed) Labs Reviewed  URINALYSIS, ROUTINE W REFLEX MICROSCOPIC (NOT AT [facility]) - Abnormal; Notable for the following:       Result Value   Specific Gravity, Urine 1.003 (*)    All other components within normal limits  URINE CULTURE    EKG  EKG Interpretation None       Radiology No results found.  Procedures Procedures (including critical care time)  Medications Ordered in ED Medications - No data to display   Initial Impression / Assessment and Plan / ED Course  I have reviewed the triage vital signs and the nursing notes.  Pertinent labs & imaging results that  were available during my care of the patient were reviewed by me and considered in my medical decision making (see chart for details).  Clinical Course    Stephanie Foster is a 82 y.o. female With a past medical history significant for diverticulitis and GERD who presents with dysuria.  History and exam are seen above.  Patients exam completely unremarkable. Due to history of UTI and dysuria, UA was sent.   Urine shows no evidence of UTI. Culture was sent.   Suspect irritation as source of dysuria. As patient took a Cipro yesterday, any infection may have been partially treated. Will follow up on urine culture. Patient reports that she has azo to treat her dysuria at home. Patient was instructed to continue hydration, watch for systemic symptoms of UTI, take the azo, and follow up with her PCP. Patient understood return precautions as well as follow instructions. Patient discharged in good condition.   Final Clinical Impressions(s) / ED Diagnoses   Final diagnoses:  Dysuria    New Prescriptions Discharge Medication List as of 12/14/2015  1:21 PM  Clinical Impression: 1. Dysuria     Disposition: Discharge  Condition: Good  I have discussed the results, Dx and Tx plan with the pt(& family if present). He/she/they expressed understanding and agree(s) with the plan. Discharge instructions discussed at great length. Strict return precautions discussed and pt &/or family have verbalized understanding of the instructions. No further questions at time of discharge.    Discharge Medication List as of 12/14/2015  1:21 PM      Follow Up: Garth SchlatterWilliam Otis Ameen, MD [facility] [address] [place] [id number] [phone]     [facility] [address] [id number] [address] [place] [id number] [phone]  If symptoms worsen     Heide Scaleshristopher J Spiros Greenfeld, MD 12/15/15 1158

## 2015-12-14 NOTE — Discharge Instructions (Signed)
Please take your Azo at home for burning urination. If symptoms return or worsen, please [place] to the [place]. Please follow-up with your PCP in the next several days.

## 2015-12-14 NOTE — ED Triage Notes (Signed)
Painful urination and frequency x 3 days.

## 2015-12-16 LAB — URINE CULTURE: Culture: <10000 — AB

## 2017-11-16 DIAGNOSIS — Z961 Presence of intraocular lens: Secondary | ICD-10-CM

## 2017-11-16 DIAGNOSIS — H04123 Dry eye syndrome of bilateral lacrimal glands: Secondary | ICD-10-CM

## 2017-11-16 DIAGNOSIS — H353131 Nonexudative age-related macular degeneration, bilateral, early dry stage: Secondary | ICD-10-CM | POA: Insufficient documentation

## 2017-11-16 HISTORY — DX: Nonexudative age-related macular degeneration, bilateral, early dry stage: H35.3131

## 2017-11-16 HISTORY — DX: Presence of intraocular lens: Z96.1

## 2017-11-16 HISTORY — DX: Dry eye syndrome of bilateral lacrimal glands: H04.123

## 2020-02-22 ENCOUNTER — Inpatient Hospital Stay (HOSPITAL_COMMUNITY): Payer: Medicare Other

## 2020-02-22 ENCOUNTER — Encounter (HOSPITAL_COMMUNITY): Payer: Self-pay | Admitting: Emergency Medicine

## 2020-02-22 ENCOUNTER — Other Ambulatory Visit: Payer: Self-pay

## 2020-02-22 ENCOUNTER — Emergency Department (HOSPITAL_COMMUNITY): Payer: Medicare Other

## 2020-02-22 ENCOUNTER — Inpatient Hospital Stay (HOSPITAL_COMMUNITY)
Admission: EM | Admit: 2020-02-22 | Discharge: 2020-02-24 | DRG: 066 | Disposition: A | Discharge status: Home / Self Care | Payer: Medicare Other | Attending: Internal Medicine | Admitting: Internal Medicine

## 2020-02-22 DIAGNOSIS — I639 Cerebral infarction, unspecified: Secondary | ICD-10-CM

## 2020-02-22 DIAGNOSIS — Z881 Allergy status to other antibiotic agents status: Secondary | ICD-10-CM | POA: Diagnosis not present

## 2020-02-22 DIAGNOSIS — I63412 Cerebral infarction due to embolism of left middle cerebral artery: Secondary | ICD-10-CM | POA: Diagnosis not present

## 2020-02-22 DIAGNOSIS — I10 Essential (primary) hypertension: Secondary | ICD-10-CM | POA: Diagnosis present

## 2020-02-22 DIAGNOSIS — D500 Iron deficiency anemia secondary to blood loss (chronic): Secondary | ICD-10-CM | POA: Diagnosis present

## 2020-02-22 DIAGNOSIS — Z882 Allergy status to sulfonamides status: Secondary | ICD-10-CM | POA: Diagnosis not present

## 2020-02-22 DIAGNOSIS — G8321 Monoplegia of upper limb affecting right dominant side: Secondary | ICD-10-CM | POA: Diagnosis present

## 2020-02-22 DIAGNOSIS — E785 Hyperlipidemia, unspecified: Secondary | ICD-10-CM | POA: Diagnosis present

## 2020-02-22 DIAGNOSIS — I6340 Cerebral infarction due to embolism of unspecified cerebral artery: Principal | ICD-10-CM | POA: Diagnosis present

## 2020-02-22 DIAGNOSIS — H3530 Unspecified macular degeneration: Secondary | ICD-10-CM | POA: Diagnosis present

## 2020-02-22 DIAGNOSIS — E7800 Pure hypercholesterolemia, unspecified: Secondary | ICD-10-CM | POA: Diagnosis not present

## 2020-02-22 DIAGNOSIS — I493 Ventricular premature depolarization: Secondary | ICD-10-CM | POA: Diagnosis present

## 2020-02-22 DIAGNOSIS — Z91048 Other nonmedicinal substance allergy status: Secondary | ICD-10-CM

## 2020-02-22 DIAGNOSIS — Z8249 Family history of ischemic heart disease and other diseases of the circulatory system: Secondary | ICD-10-CM

## 2020-02-22 DIAGNOSIS — K5790 Diverticulosis of intestine, part unspecified, without perforation or abscess without bleeding: Secondary | ICD-10-CM | POA: Diagnosis present

## 2020-02-22 DIAGNOSIS — Z79899 Other long term (current) drug therapy: Secondary | ICD-10-CM

## 2020-02-22 DIAGNOSIS — Z20822 Contact with and (suspected) exposure to covid-19: Secondary | ICD-10-CM | POA: Diagnosis present

## 2020-02-22 DIAGNOSIS — Z885 Allergy status to narcotic agent status: Secondary | ICD-10-CM | POA: Diagnosis not present

## 2020-02-22 DIAGNOSIS — Z888 Allergy status to other drugs, medicaments and biological substances status: Secondary | ICD-10-CM

## 2020-02-22 DIAGNOSIS — I491 Atrial premature depolarization: Secondary | ICD-10-CM | POA: Diagnosis not present

## 2020-02-22 DIAGNOSIS — K219 Gastro-esophageal reflux disease without esophagitis: Secondary | ICD-10-CM | POA: Diagnosis present

## 2020-02-22 DIAGNOSIS — Z886 Allergy status to analgesic agent status: Secondary | ICD-10-CM

## 2020-02-22 DIAGNOSIS — R29701 NIHSS score 1: Secondary | ICD-10-CM | POA: Diagnosis present

## 2020-02-22 DIAGNOSIS — I6389 Other cerebral infarction: Secondary | ICD-10-CM | POA: Diagnosis not present

## 2020-02-22 DIAGNOSIS — Z87440 Personal history of urinary (tract) infections: Secondary | ICD-10-CM

## 2020-02-22 HISTORY — DX: Cerebral infarction, unspecified: I63.9

## 2020-02-22 LAB — CBC
HCT: 38.5 % (ref 36.0–46.0)
Hemoglobin: 11.1 g/dL — ABNORMAL LOW (ref 12.0–15.0)
MCH: 21.9 pg — ABNORMAL LOW (ref 26.0–34.0)
MCHC: 28.8 g/dL — ABNORMAL LOW (ref 30.0–36.0)
MCV: 76.1 fL — ABNORMAL LOW (ref 80.0–100.0)
Platelets: 183 10*3/uL (ref 150–400)
RBC: 5.06 MIL/uL (ref 3.87–5.11)
RDW: 22.5 % — ABNORMAL HIGH (ref 11.5–15.5)
WBC: 5.6 10*3/uL (ref 4.0–10.5)
nRBC: 0 % (ref 0.0–0.2)

## 2020-02-22 LAB — DIFFERENTIAL
Abs Immature Granulocytes: 0.02 10*3/uL (ref 0.00–0.07)
Basophils Absolute: 0 10*3/uL (ref 0.0–0.1)
Basophils Relative: 1 %
Eosinophils Absolute: 0 10*3/uL (ref 0.0–0.5)
Eosinophils Relative: 1 %
Immature Granulocytes: 0 %
Lymphocytes Relative: 30 %
Lymphs Abs: 1.7 10*3/uL (ref 0.7–4.0)
Monocytes Absolute: 0.5 10*3/uL (ref 0.1–1.0)
Monocytes Relative: 9 %
Neutro Abs: 3.3 10*3/uL (ref 1.7–7.7)
Neutrophils Relative %: 59 %

## 2020-02-22 LAB — COMPREHENSIVE METABOLIC PANEL
ALT: 12 U/L (ref 0–44)
AST: 18 U/L (ref 15–41)
Albumin: 3.4 g/dL — ABNORMAL LOW (ref 3.5–5.0)
Alkaline Phosphatase: 89 U/L (ref 38–126)
Anion gap: 12 (ref 5–15)
BUN: 25 mg/dL — ABNORMAL HIGH (ref 8–23)
CO2: 23 mmol/L (ref 22–32)
Calcium: 8.6 mg/dL — ABNORMAL LOW (ref 8.9–10.3)
Chloride: 107 mmol/L (ref 98–111)
Creatinine, Ser: 1.03 mg/dL — ABNORMAL HIGH (ref 0.44–1.00)
GFR, Estimated: 51 mL/min — ABNORMAL LOW (ref >60)
Glucose, Bld: 128 mg/dL — ABNORMAL HIGH (ref 70–99)
Potassium: 3.9 mmol/L (ref 3.5–5.1)
Sodium: 142 mmol/L (ref 135–145)
Total Bilirubin: 0.5 mg/dL (ref 0.3–1.2)
Total Protein: 6.3 g/dL — ABNORMAL LOW (ref 6.5–8.1)

## 2020-02-22 LAB — I-STAT CHEM 8, ED
BUN: 28 mg/dL — ABNORMAL HIGH (ref 8–23)
Calcium, Ion: 1.11 mmol/L — ABNORMAL LOW (ref 1.15–1.40)
Chloride: 108 mmol/L (ref 98–111)
Creatinine, Ser: 1 mg/dL (ref 0.44–1.00)
Glucose, Bld: 124 mg/dL — ABNORMAL HIGH (ref 70–99)
HCT: 37 % (ref 36.0–46.0)
Hemoglobin: 12.6 g/dL (ref 12.0–15.0)
Potassium: 3.8 mmol/L (ref 3.5–5.1)
Sodium: 144 mmol/L (ref 135–145)
TCO2: 25 mmol/L (ref 22–32)

## 2020-02-22 LAB — CBG MONITORING, ED: Glucose-Capillary: 116 mg/dL — ABNORMAL HIGH (ref 70–99)

## 2020-02-22 LAB — URINALYSIS, ROUTINE W REFLEX MICROSCOPIC
Bilirubin Urine: NEGATIVE
Glucose, UA: NEGATIVE mg/dL
Hgb urine dipstick: NEGATIVE
Ketones, ur: NEGATIVE mg/dL
Nitrite: POSITIVE — AB
Protein, ur: NEGATIVE mg/dL
Specific Gravity, Urine: 1.014 (ref 1.005–1.030)
pH: 6 (ref 5.0–8.0)

## 2020-02-22 LAB — ECHOCARDIOGRAM COMPLETE
Area-P 1/2: 3.48 cm2
Height: 60 in
S' Lateral: 2.3 cm
Weight: 2335.11 oz

## 2020-02-22 LAB — APTT: aPTT: 28 seconds (ref 24–36)

## 2020-02-22 LAB — PROTIME-INR
INR: 1 (ref 0.8–1.2)
Prothrombin Time: 12.9 seconds (ref 11.4–15.2)

## 2020-02-22 LAB — SARS CORONAVIRUS 2 (TAT 6-24 HRS): SARS Coronavirus 2: NEGATIVE

## 2020-02-22 IMAGING — CT CT HEAD CODE STROKE
3 series · 15 of 47 positions shown, 18 images · non-contrast
Comparison: [DATE] report and prior.

CLINICAL DATA: Code stroke.  Neuro deficit, acute, stroke suspected

EXAM:
CT HEAD WITHOUT CONTRAST
TECHNIQUE: Contiguous axial images were obtained from the base of the skull
through the vertex without intravenous contrast.

[Series 3: head 5.0 st · axial · 0.45mm/px · z∈[-132,+2]mm · 9 of 33 slices shown, 12 images]
[im 3/33  brain]
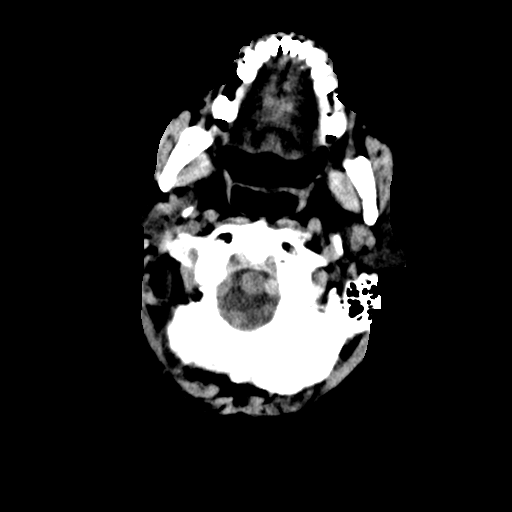
[im 3/33  bone]
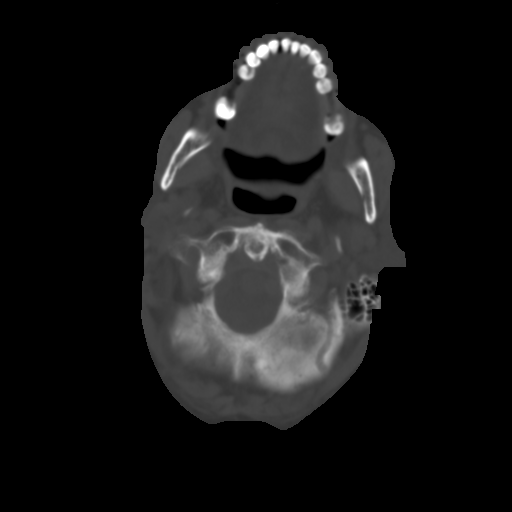
[im 6/33  brain]
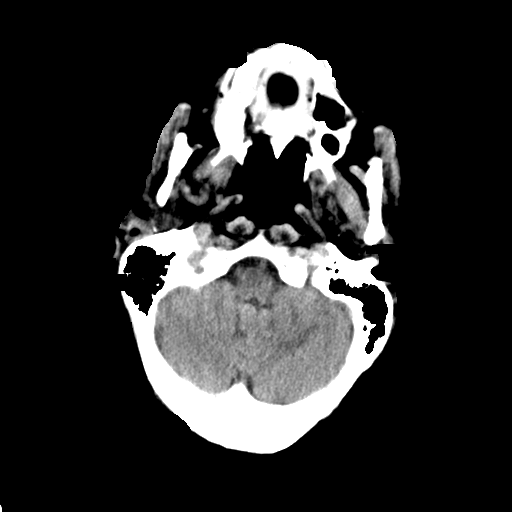
[im 9/33  brain]
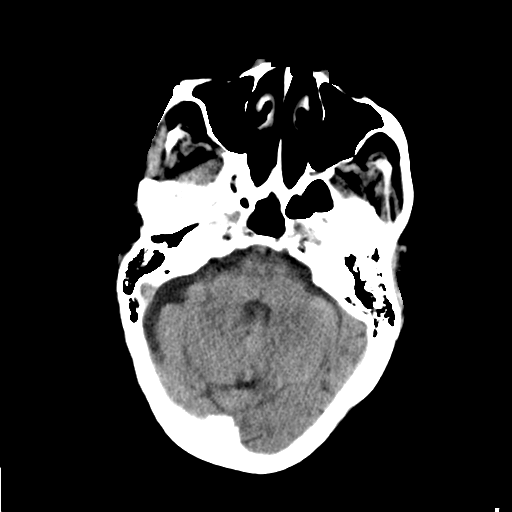
[im 13/33  brain]
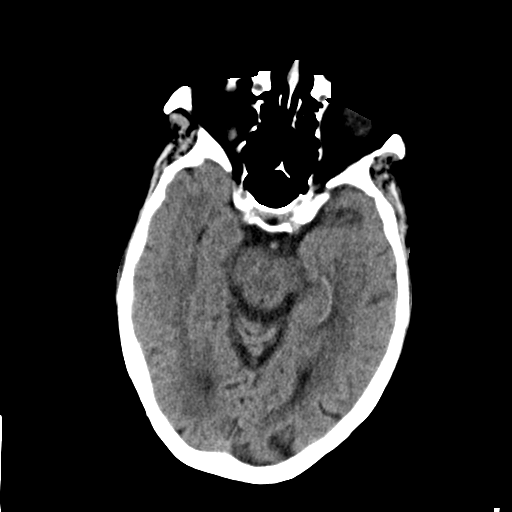
[im 17/33  brain]
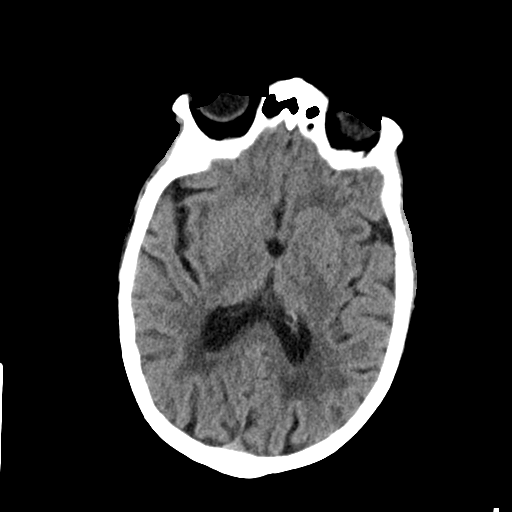
[im 17/33  bone]
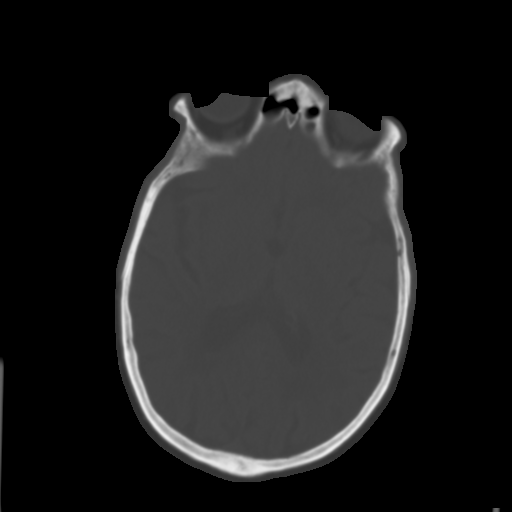
[im 20/33  brain]
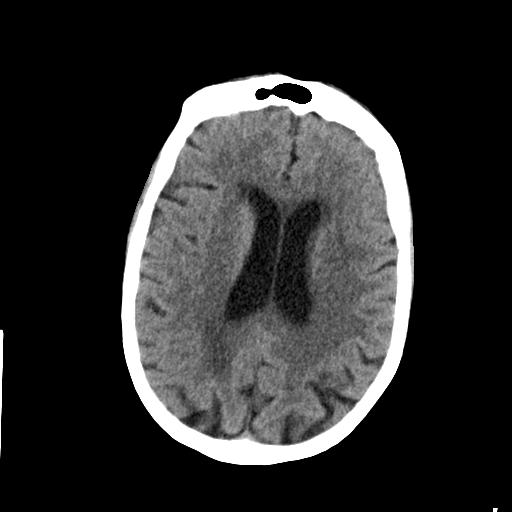
[im 24/33  brain]
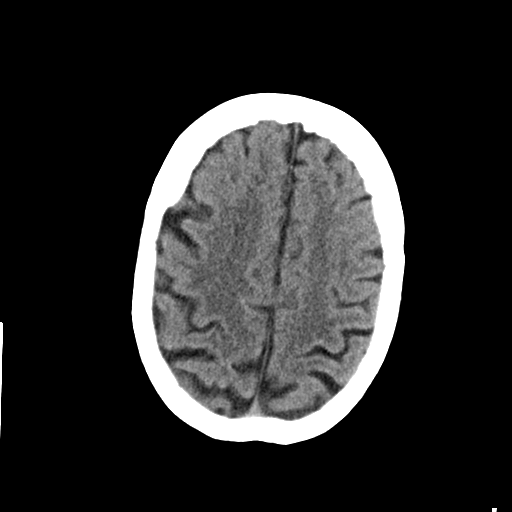
[im 27/33  brain]
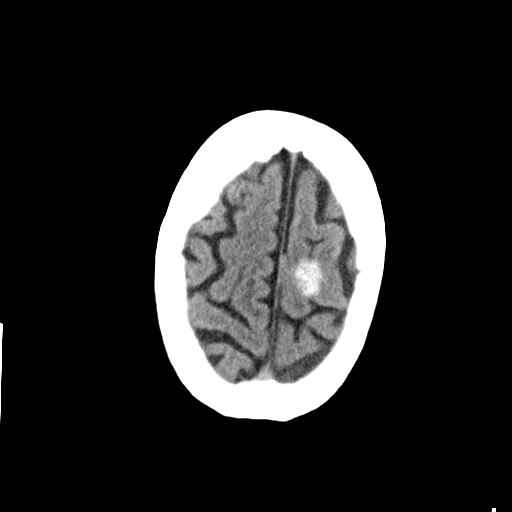
[im 30/33  brain]
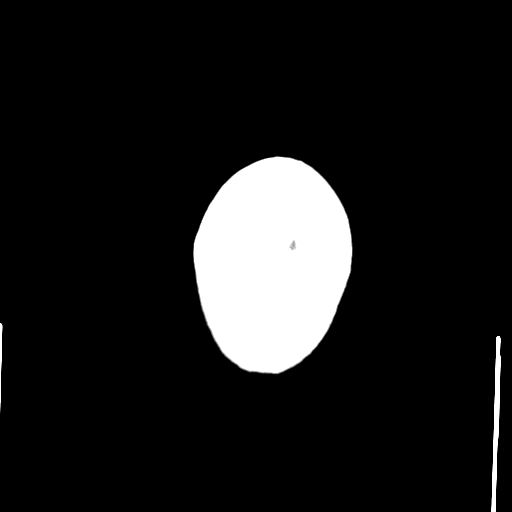
[im 30/33  bone]
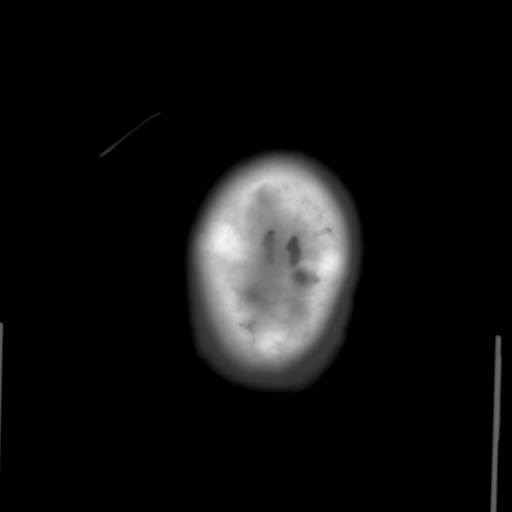

[Series 5: head 3.0 cor st · coronal · 0.34mm/px · 3 of 67 slices shown]
[im 23/67  brain]
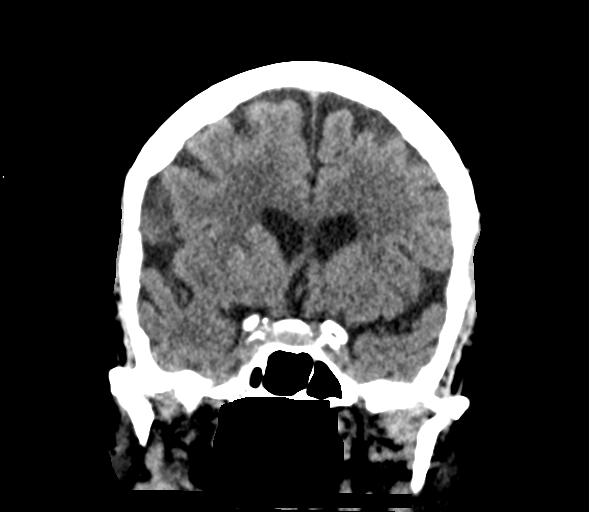
[im 30/67  brain]
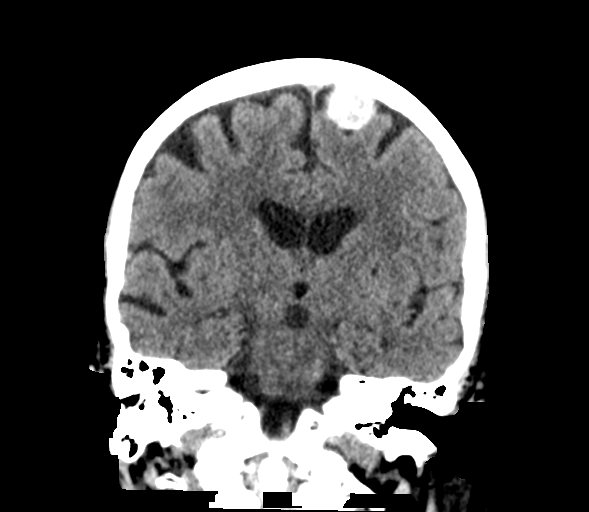
[im 37/67  brain]
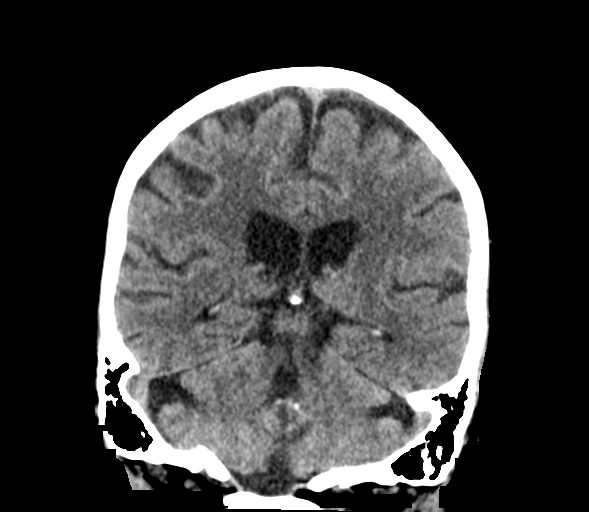

[Series 6: head 3.0 sag st · sagittal · 0.32mm/px · 3 of 58 slices shown]
[im 20/58  brain]
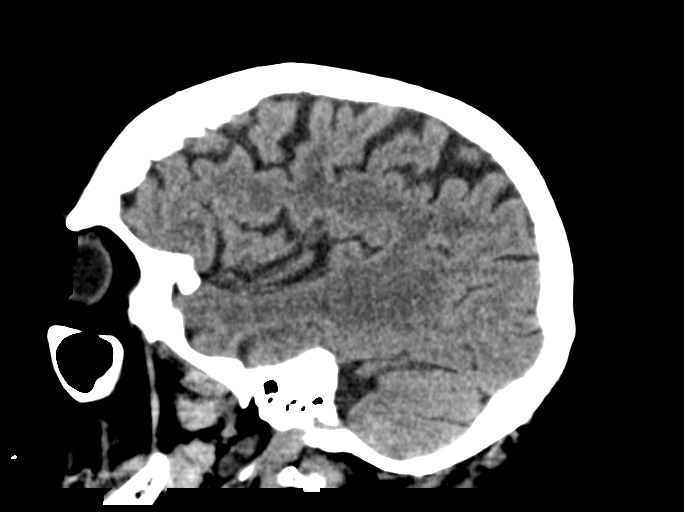
[im 29/58  brain]
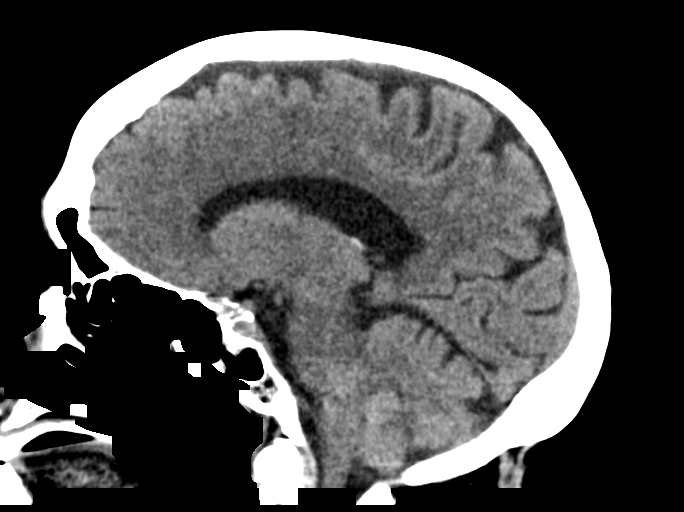
[im 39/58  brain]
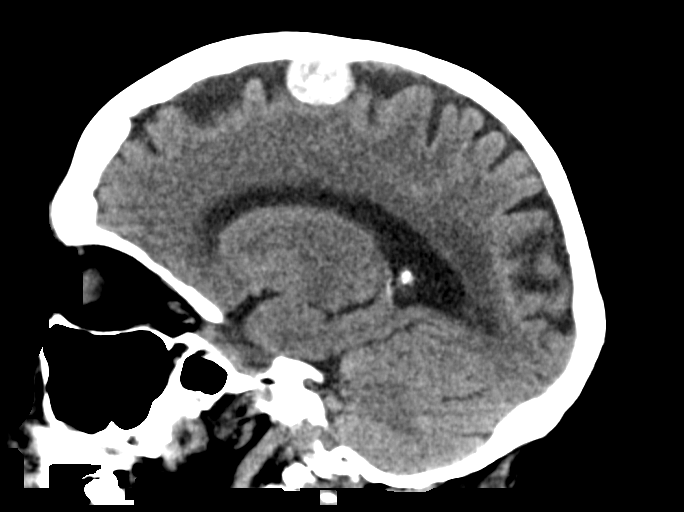

[15 of 47 positions shown; findings below may reference images not displayed]

FINDINGS: Brain: No intracranial hemorrhage. No acute infarct. Calcified left
frontal meningioma at the vertex. 0.8 cm hyperdense mass overlying
the right cerebellum. No midline shift, ventriculomegaly or
extra-axial fluid collection. Scattered and confluent supratentorial
white matter hypodense foci are nonspecific however commonly
associated with chronic microvascular ischemic changes. Mild
cerebral atrophy with ex vacuo dilatation.

Vascular: No hyperdense vessel or unexpected calcification.
Bilateral skull base atherosclerotic calcifications.

Skull: No acute finding.

Sinuses/Orbits: No acute finding.

Other: None.

ASPECTS (Alberta Stroke Program Early CT Score)

- Ganglionic level infarction (caudate, lentiform nuclei, internal
capsule, insula, M1-M3 cortex): 7

- Supraganglionic infarction (M4-M6 cortex): 3

Total score (0-10 with 10 being normal): 10
IMPRESSION: 1. No acute intracranial process.
2. ASPECTS is 10.
3. Mild cerebral atrophy and chronic microvascular ischemic changes.
4. Calcified left frontal meningioma. Subcentimeter right posterior
cranial fossa mass may reflect a smaller meningioma. Consider MRI
for further evaluation.

pm to provider Dr. FAHRI SAID Via telephone, who verbally
acknowledged these results.

## 2020-02-22 IMAGING — MR MR MRA NECK WO/W CM
8 of 9 series · 42 of 48 positions shown · IV contrast (gadavist)
Comparison: None.

CLINICAL DATA: Right upper extremity weakness

EXAM:
MRI HEAD WITHOUT CONTRAST
MRA HEAD WITHOUT CONTRAST
MRA NECK WITHOUT AND WITH CONTRAST
TECHNIQUE: Multiplanar, multiecho pulse sequences of the brain and surrounding
structures were obtained without intravenous contrast. Angiographic
images of the Circle of Willis were obtained using MRA technique
without intravenous contrast. Angiographic images of the neck were
obtained using MRA technique without and with intravenous contrast.
Carotid stenosis measurements (when applicable) are obtained
utilizing NASCET criteria, using the distal internal carotid
diameter as the denominator.
CONTRAST:  6.5mL GADAVIST GADOBUTROL 1 MMOL/ML IV SOLN

[Series 17: tof_fl3d_tra_iso · axial · 0.6mm · 0.52mm/px · z∈[-229,-147]mm · 7 of 146 slices shown]
[im 1/146]
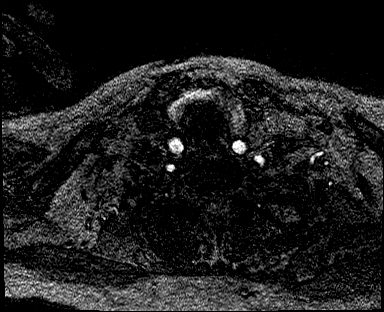
[im 25/146]
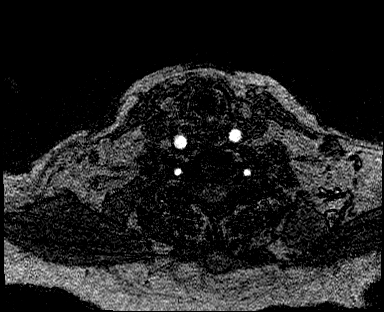
[im 49/146]
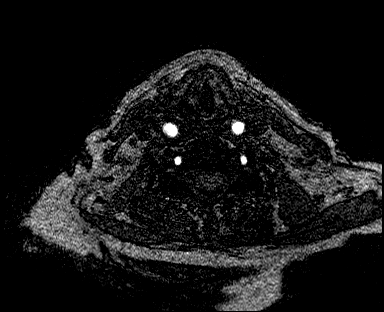
[im 73/146]
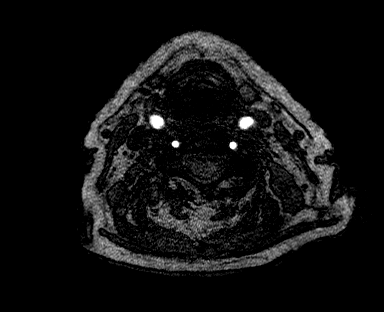
[im 97/146]
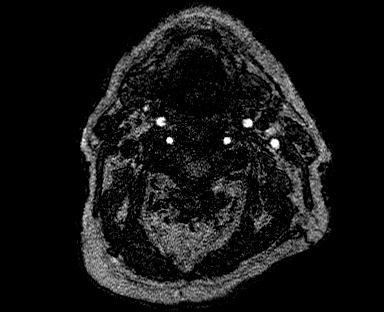
[im 121/146]
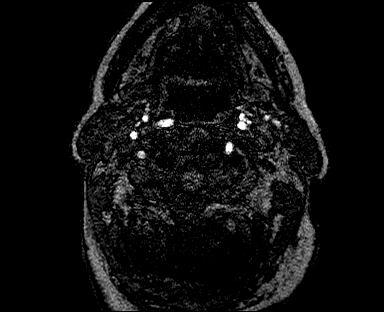
[im 146/146]
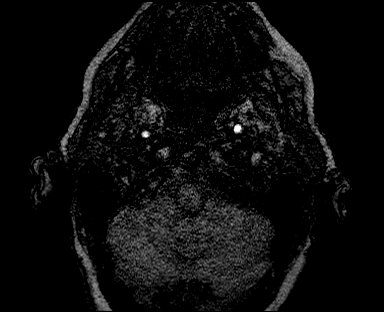

[Series 20: angio_fl3d_cor_pre_ttc=3.0s · coronal · 0.9mm · 0.85mm/px · 5 of 88 slices shown]
[im 1/88]
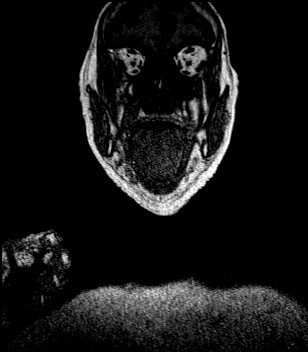
[im 22/88]
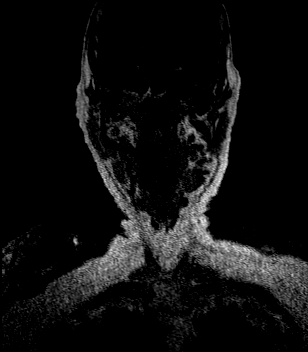
[im 44/88]
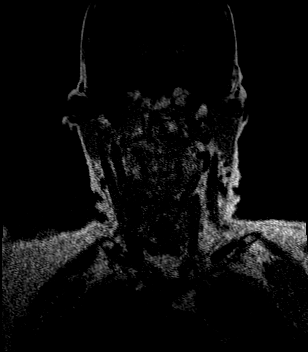
[im 66/88]
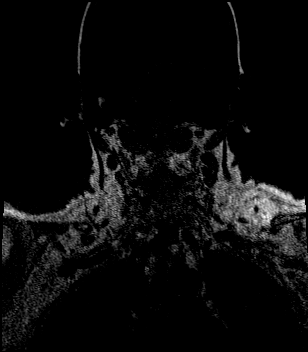
[im 88/88]
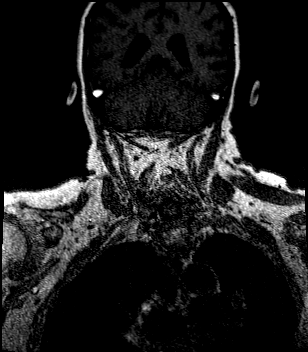

[Series 22: angio_fl3d_cor_post_ttc=3.0s · coronal · 0.9mm · 0.85mm/px · 5 of 88 slices shown (1 of 2)]
[im 1/88]
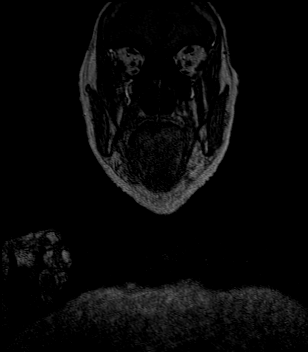
[im 22/88]
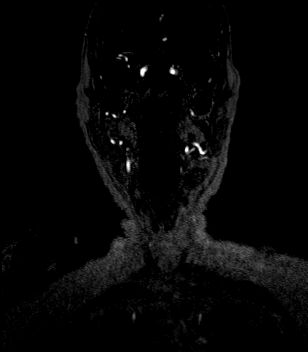
[im 44/88]
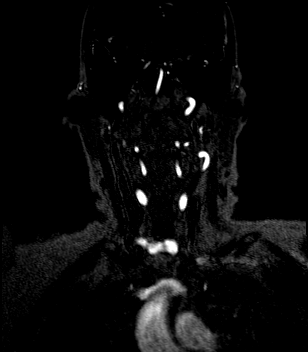
[im 66/88]
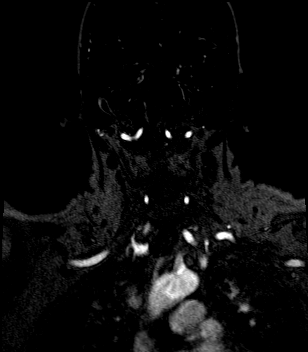
[im 88/88]
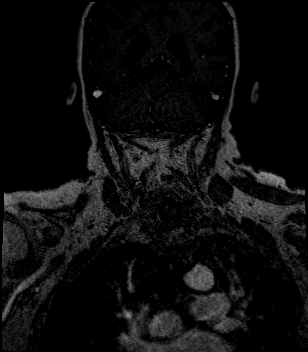

[Series 23: angio_fl3d_cor_post_ttc=3.0s_moco-adv · coronal · 0.9mm · 0.85mm/px · 5 of 88 slices shown (1 of 2)]
[im 1/88]
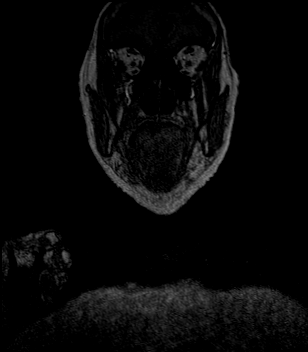
[im 22/88]
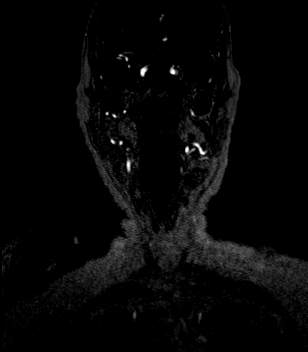
[im 44/88]
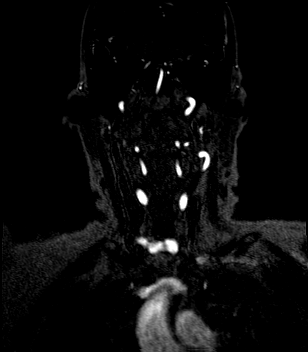
[im 66/88]
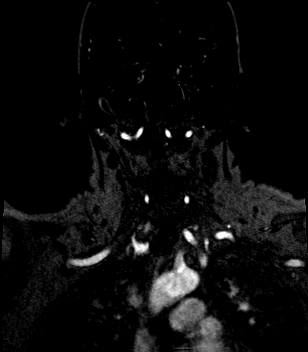
[im 88/88]
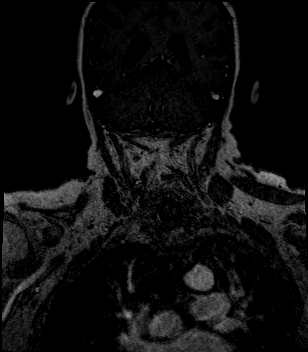

[Series 24: angio_fl3d_cor_post_ttc=3.0s_moco-adv_sub · coronal · 0.9mm · 0.85mm/px · 5 of 88 slices shown (1 of 2)]
[im 1/88]
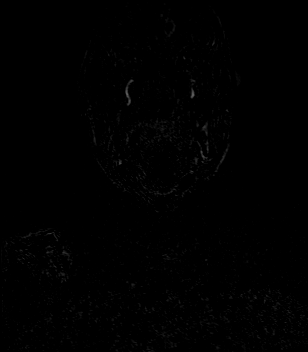
[im 22/88]
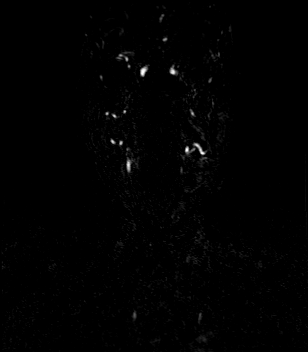
[im 44/88]
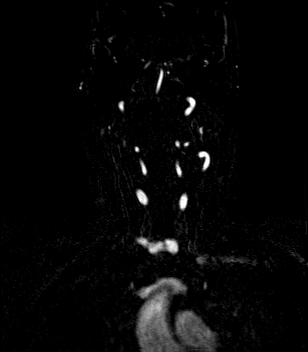
[im 66/88]
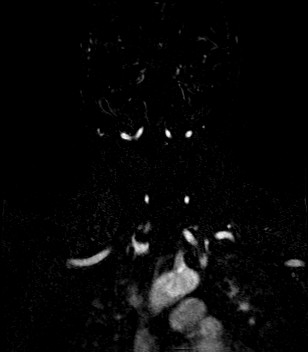
[im 88/88]
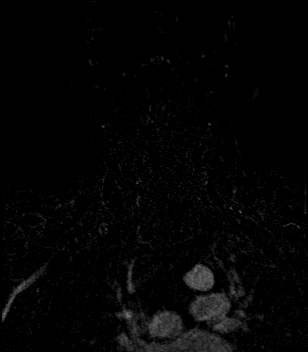

[Series 26: angio_fl3d_cor_post_ttc=3.0s · coronal · 0.9mm · 0.85mm/px · 5 of 88 slices shown (2 of 2)]
[im 1/88]
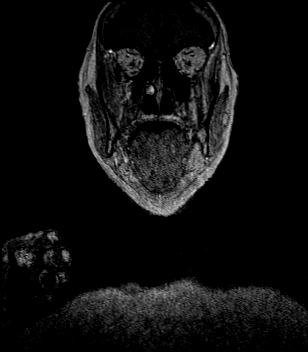
[im 22/88]
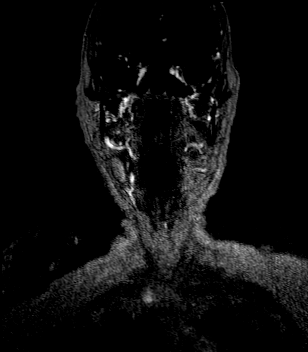
[im 44/88]
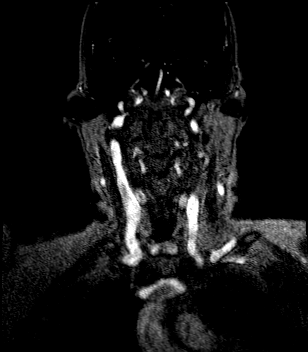
[im 66/88]
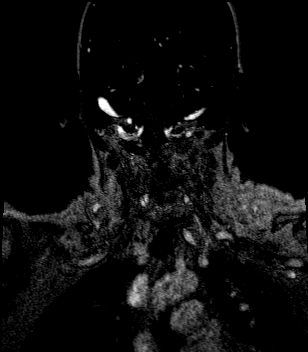
[im 88/88]
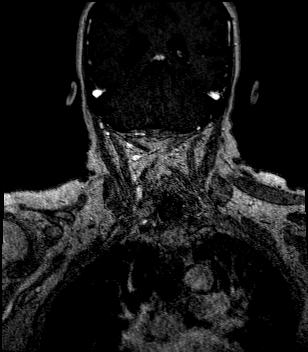

[Series 27: angio_fl3d_cor_post_ttc=3.0s_moco-adv · coronal · 0.9mm · 0.85mm/px · 5 of 88 slices shown (2 of 2)]
[im 1/88]
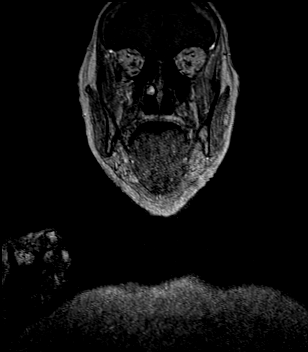
[im 22/88]
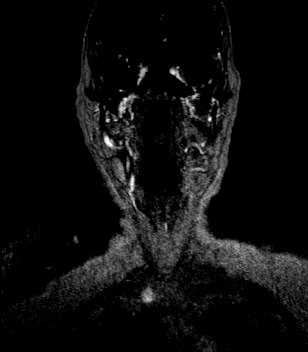
[im 44/88]
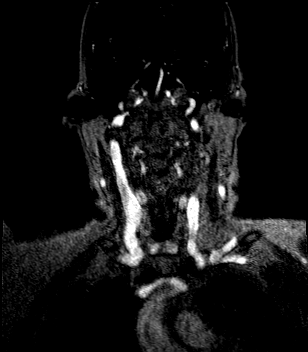
[im 66/88]
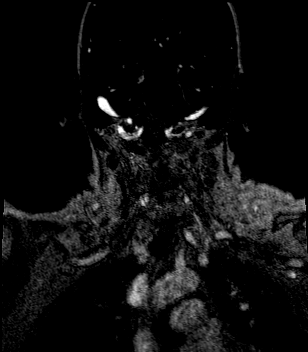
[im 88/88]
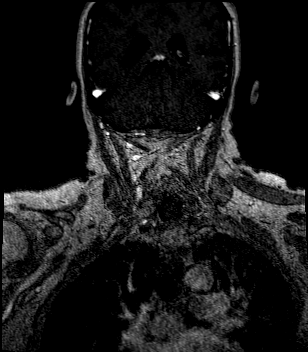

[Series 28: angio_fl3d_cor_post_ttc=3.0s_moco-adv_sub · coronal · 0.9mm · 0.85mm/px · 5 of 88 slices shown (2 of 2)]
[im 1/88]
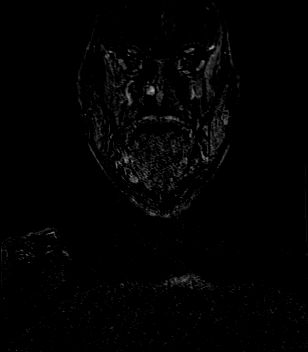
[im 22/88]
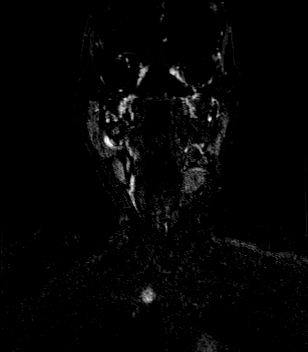
[im 44/88]
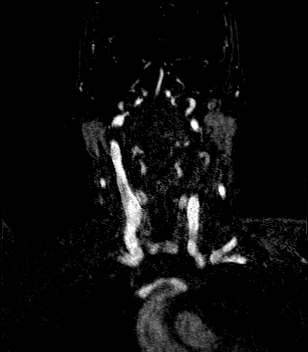
[im 66/88]
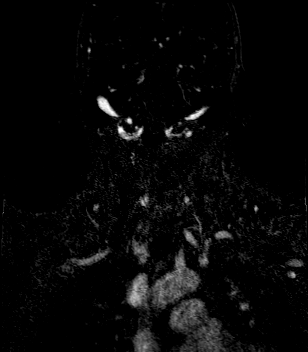
[im 88/88]
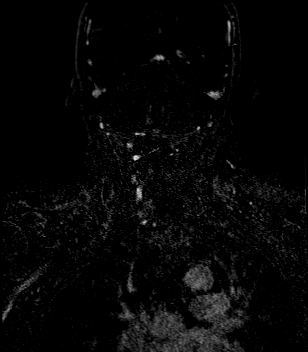

[42 of 48 positions shown; findings below may reference images not displayed]

FINDINGS: MRI HEAD

Brain: Cortical/subcortical mildly reduced diffusion in the left
perirolandic region. No evidence of intracranial hemorrhage.

Extra-axial mass along the parasagittal left frontal convexity at
the vertex measuring 2 x 1.5 cm coronally consistent with a
meningioma. This is in proximity to the superior sagittal sinus
without significant compression or invasion. Patchy and confluent
areas of T2 hyperintensity in the supratentorial white matter are
nonspecific but probably reflect moderate chronic microvascular
ischemic changes. Prominence of the ventricles and sulci reflects
mild generalized parenchymal volume loss.

No significant mass effect. There is no hydrocephalus or extra-axial
fluid collection.

Vascular: Major vessel flow voids at the skull base are preserved.

Skull and upper cervical spine: Normal marrow signal is preserved.
Degenerative changes of the cervical spine.

Sinuses/Orbits: Paranasal sinuses are aerated. Orbits are
unremarkable.

Other: Sella is unremarkable.  Mastoid air cells are clear.

MRA HEAD

Intracranial internal carotid arteries are patent. Middle and
anterior cerebral arteries are patent. Intracranial vertebral
arteries, basilar artery, posterior cerebral arteries are patent.
Left posterior communicating artery is present. There is no
significant stenosis. There is a 1-2 mm inferiorly acted outpouching
from the distal supraclinoid right ICA near the expected location of
a posterior communicating artery.

MRA NECK

Common, internal, and external carotid arteries are patent. There is
no measurable stenosis at the ICA origins. Codominant extracranial
vertebral arteries are patent without measurable stenosis or
evidence of dissection.
IMPRESSION: Small cortical/subcortical acute infarct in the left perirolandic
region. No evidence of hemorrhage.

Small left frontal convexity meningioma without parenchymal edema.

Moderate chronic microvascular ischemic changes.

No large vessel occlusion or hemodynamically significant stenosis.

1-2 mm aneurysm or infundibulum of the distal supraclinoid right
ICA.

## 2020-02-22 IMAGING — MR MR HEAD W/O CM
10 of 11 series · 43 of 48 positions shown · IV contrast (gadavist)
Comparison: None.

CLINICAL DATA: Right upper extremity weakness

EXAM:
MRI HEAD WITHOUT CONTRAST
MRA HEAD WITHOUT CONTRAST
MRA NECK WITHOUT AND WITH CONTRAST
TECHNIQUE: Multiplanar, multiecho pulse sequences of the brain and surrounding
structures were obtained without intravenous contrast. Angiographic
images of the Circle of Willis were obtained using MRA technique
without intravenous contrast. Angiographic images of the neck were
obtained using MRA technique without and with intravenous contrast.
Carotid stenosis measurements (when applicable) are obtained
utilizing NASCET criteria, using the distal internal carotid
diameter as the denominator.
CONTRAST:  6.5mL GADAVIST GADOBUTROL 1 MMOL/ML IV SOLN

[Series 5: DWI · axial · 3.0mm · 0.88mm/px · z∈[-114,+38]mm · 9 of 104 slices shown (1 of 4)]
[im 1/104]
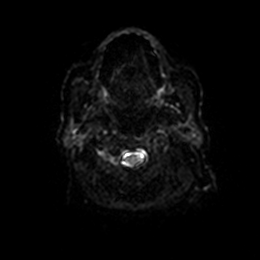
[im 13/104]
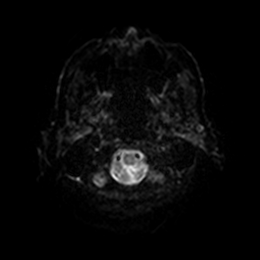
[im 26/104]
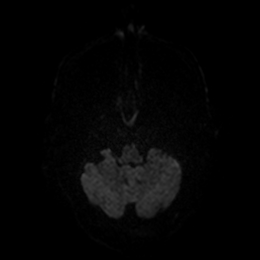
[im 39/104]
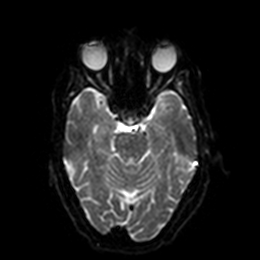
[im 52/104]
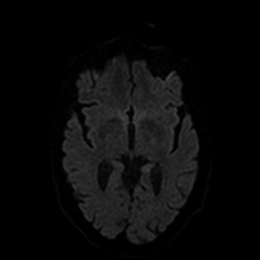
[im 65/104]
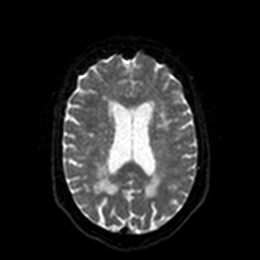
[im 78/104]
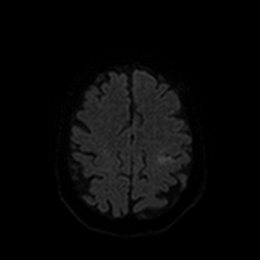
[im 91/104]
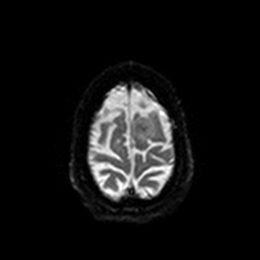
[im 104/104]
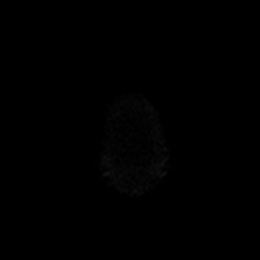

[Series 6: DWI · axial · 3.0mm · 0.88mm/px · z∈[-114,+38]mm · 5 of 52 slices shown (2 of 4)]
[im 1/52]
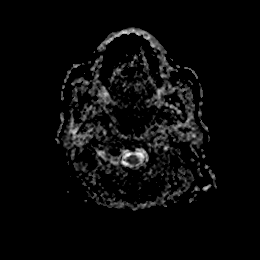
[im 13/52]
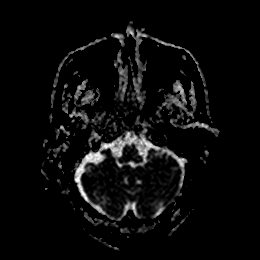
[im 26/52]
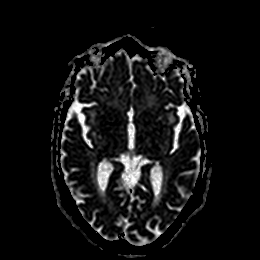
[im 39/52]
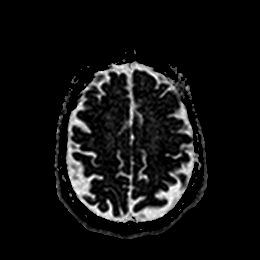
[im 52/52]
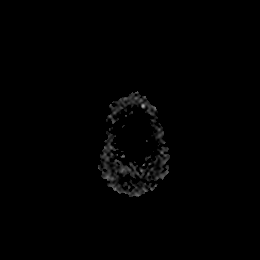

[Series 7: DWI · coronal · 4.0mm · 0.88mm/px · 7 of 72 slices shown (3 of 4)]
[im 1/72]
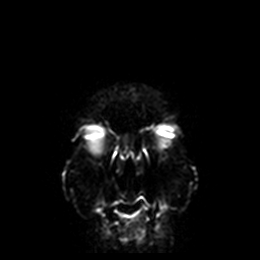
[im 12/72]
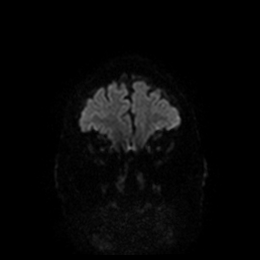
[im 24/72]
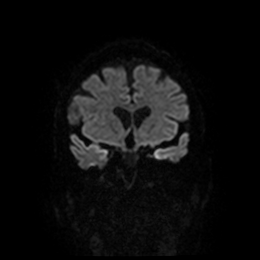
[im 36/72]
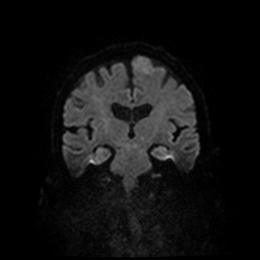
[im 48/72]
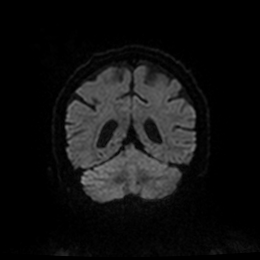
[im 60/72]
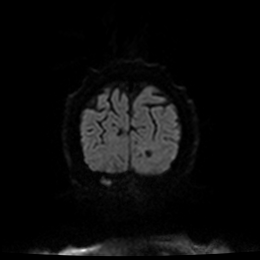
[im 72/72]
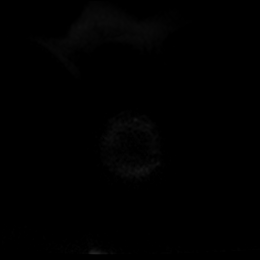

[Series 8: DWI · coronal · 4.0mm · 0.88mm/px · 3 of 36 slices shown (4 of 4)]
[im 1/36]
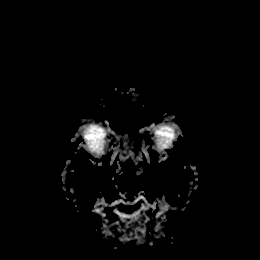
[im 18/36]
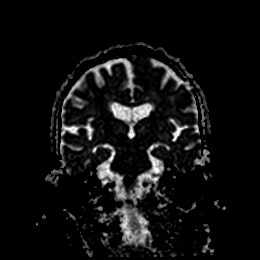
[im 36/36]
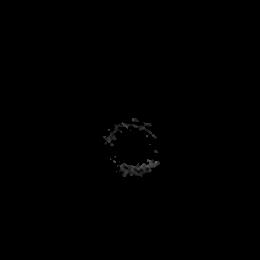

[Series 13: FLAIR · axial · 5.0mm · 0.45mm/px · z∈[-107,+36]mm · 2 of 25 slices shown]
[im 1/25]
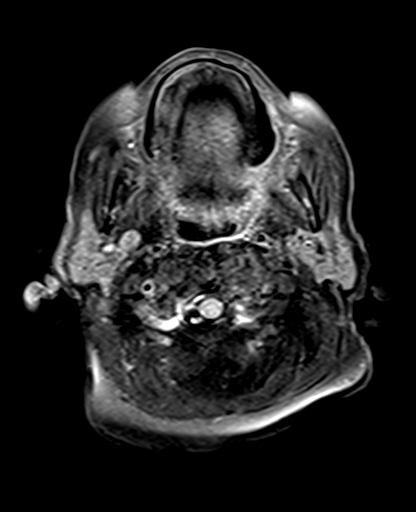
[im 25/25]
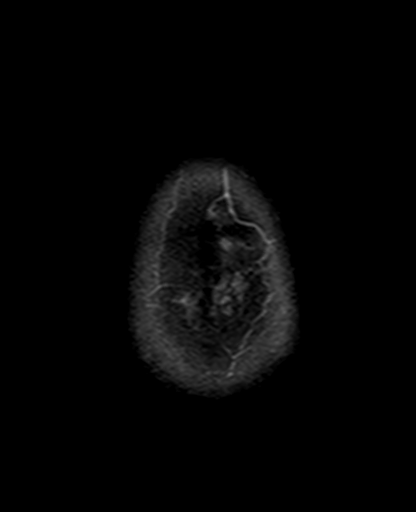

[Series 15: pha_images · axial · 3.0mm · 0.90mm/px · z∈[-112,+40]mm · 5 of 52 slices shown]
[im 1/52]
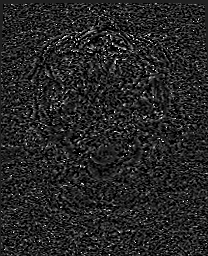
[im 13/52]
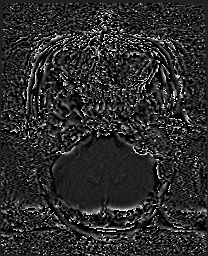
[im 26/52]
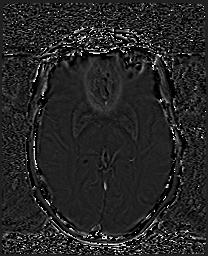
[im 39/52]
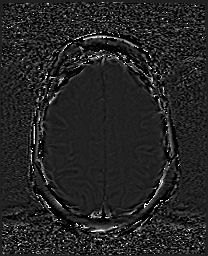
[im 52/52]
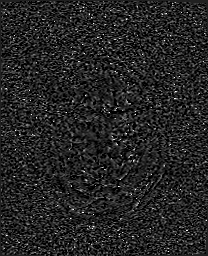

[Series 16: swi_images · axial · 3.0mm · 0.90mm/px · z∈[-112,+40]mm · 5 of 52 slices shown]
[im 1/52]
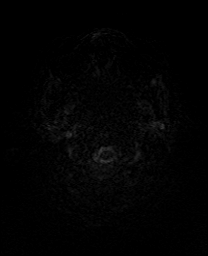
[im 13/52]
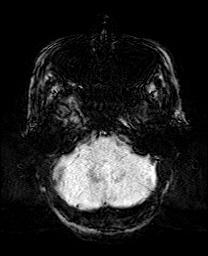
[im 26/52]
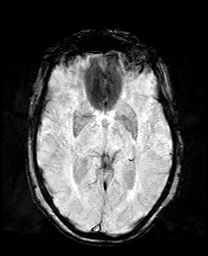
[im 39/52]
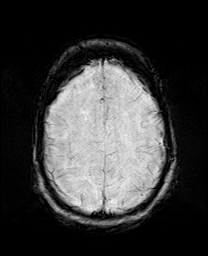
[im 52/52]
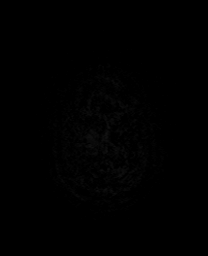

[Series 18: T1 · sagittal · 5.0mm · 0.75mm/px · 2 of 23 slices shown]
[im 1/23]
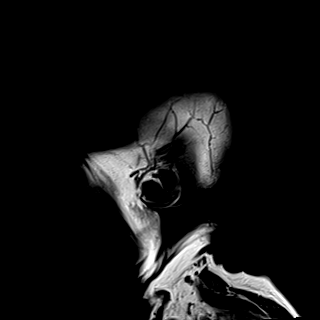
[im 23/23]
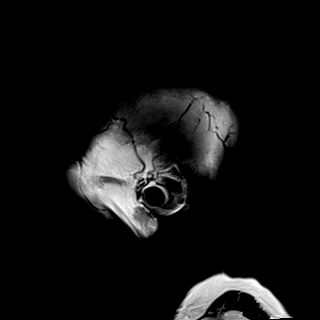

[Series 19: T2 · axial · 5.0mm · 0.72mm/px · z∈[-106,+36]mm · 2 of 25 slices shown (1 of 2)]
[im 1/25]
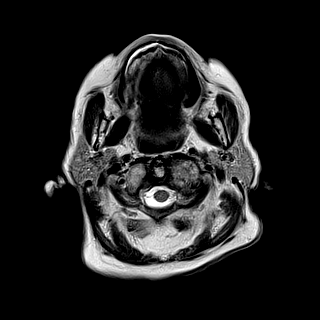
[im 25/25]
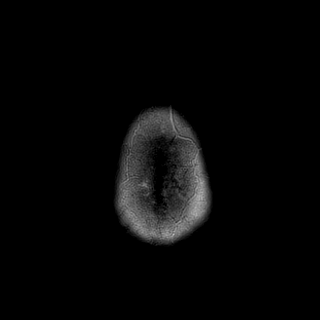

[Series 21: T2 · coronal · 5.0mm · 0.34mm/px · 3 of 30 slices shown (2 of 2)]
[im 1/30]
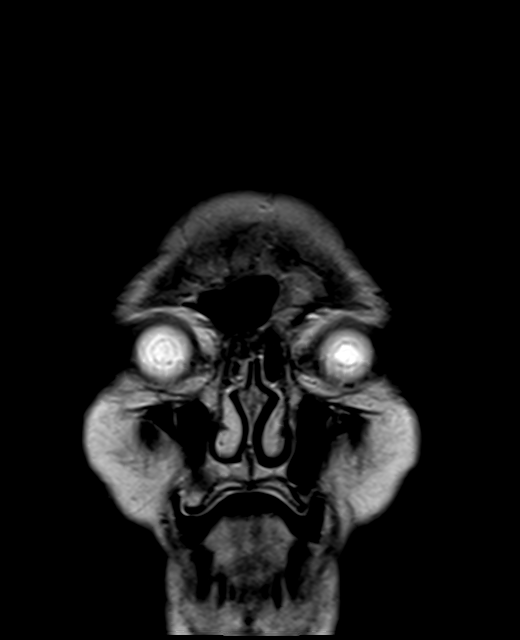
[im 15/30]
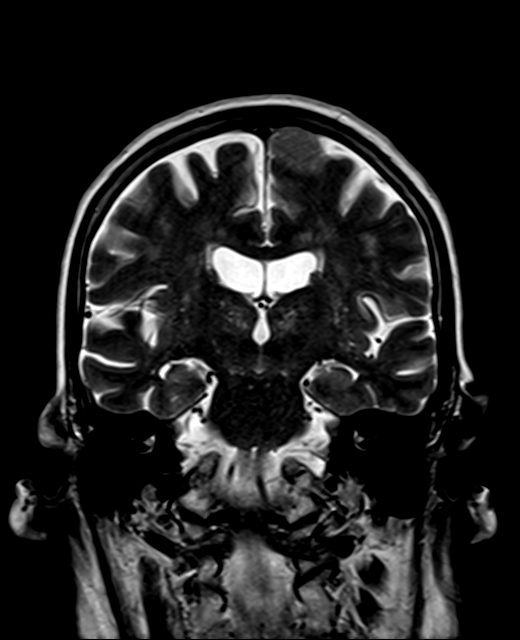
[im 30/30]
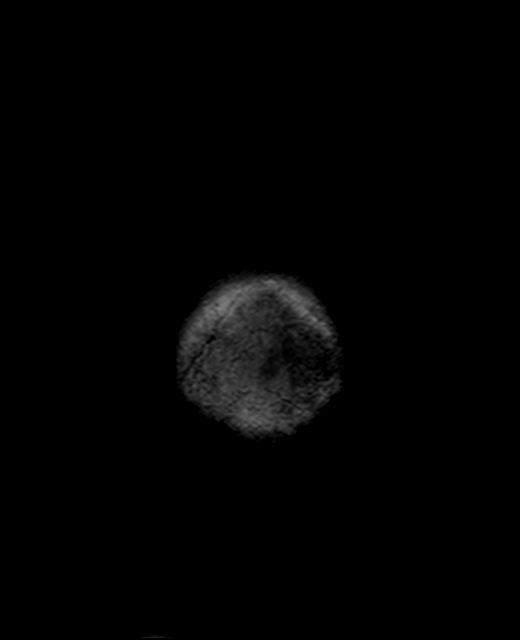

[43 of 48 positions shown; findings below may reference images not displayed]

FINDINGS: MRI HEAD

Brain: Cortical/subcortical mildly reduced diffusion in the left
perirolandic region. No evidence of intracranial hemorrhage.

Extra-axial mass along the parasagittal left frontal convexity at
the vertex measuring 2 x 1.5 cm coronally consistent with a
meningioma. This is in proximity to the superior sagittal sinus
without significant compression or invasion. Patchy and confluent
areas of T2 hyperintensity in the supratentorial white matter are
nonspecific but probably reflect moderate chronic microvascular
ischemic changes. Prominence of the ventricles and sulci reflects
mild generalized parenchymal volume loss.

No significant mass effect. There is no hydrocephalus or extra-axial
fluid collection.

Vascular: Major vessel flow voids at the skull base are preserved.

Skull and upper cervical spine: Normal marrow signal is preserved.
Degenerative changes of the cervical spine.

Sinuses/Orbits: Paranasal sinuses are aerated. Orbits are
unremarkable.

Other: Sella is unremarkable.  Mastoid air cells are clear.

MRA HEAD

Intracranial internal carotid arteries are patent. Middle and
anterior cerebral arteries are patent. Intracranial vertebral
arteries, basilar artery, posterior cerebral arteries are patent.
Left posterior communicating artery is present. There is no
significant stenosis. There is a 1-2 mm inferiorly acted outpouching
from the distal supraclinoid right ICA near the expected location of
a posterior communicating artery.

MRA NECK

Common, internal, and external carotid arteries are patent. There is
no measurable stenosis at the ICA origins. Codominant extracranial
vertebral arteries are patent without measurable stenosis or
evidence of dissection.
IMPRESSION: Small cortical/subcortical acute infarct in the left perirolandic
region. No evidence of hemorrhage.

Small left frontal convexity meningioma without parenchymal edema.

Moderate chronic microvascular ischemic changes.

No large vessel occlusion or hemodynamically significant stenosis.

1-2 mm aneurysm or infundibulum of the distal supraclinoid right
ICA.

## 2020-02-22 IMAGING — MR MR MRA HEAD W/O CM
1 series · 24 of 48 positions shown · IV contrast (gadavist)
Comparison: None.

CLINICAL DATA: Right upper extremity weakness

EXAM:
MRI HEAD WITHOUT CONTRAST
MRA HEAD WITHOUT CONTRAST
MRA NECK WITHOUT AND WITH CONTRAST
TECHNIQUE: Multiplanar, multiecho pulse sequences of the brain and surrounding
structures were obtained without intravenous contrast. Angiographic
images of the Circle of Willis were obtained using MRA technique
without intravenous contrast. Angiographic images of the neck were
obtained using MRA technique without and with intravenous contrast.
Carotid stenosis measurements (when applicable) are obtained
utilizing NASCET criteria, using the distal internal carotid
diameter as the denominator.
CONTRAST:  6.5mL GADAVIST GADOBUTROL 1 MMOL/ML IV SOLN

[Series 9: 3d cow · axial · 0.5mm · 0.41mm/px · z∈[-107,-26]mm · 24 of 172 slices shown]
[im 1/172]
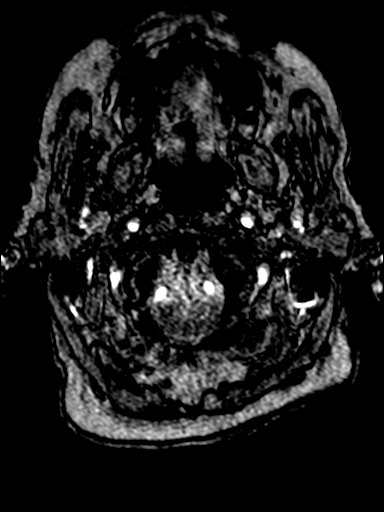
[im 4/172]
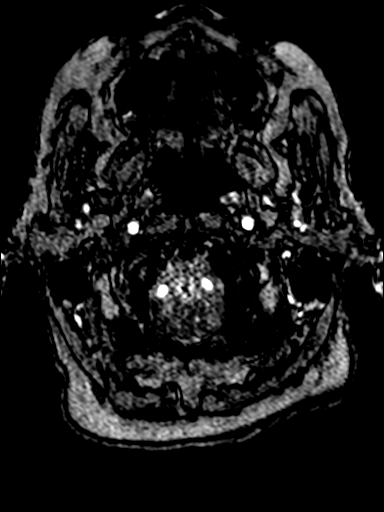
[im 8/172]
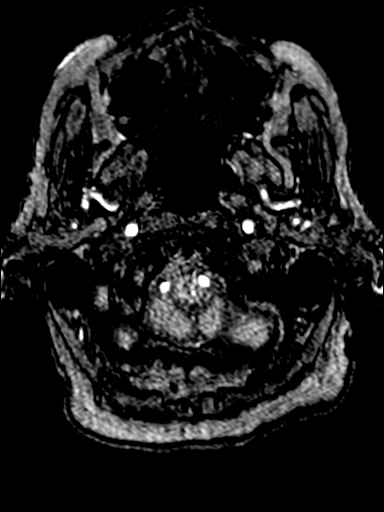
[im 11/172]
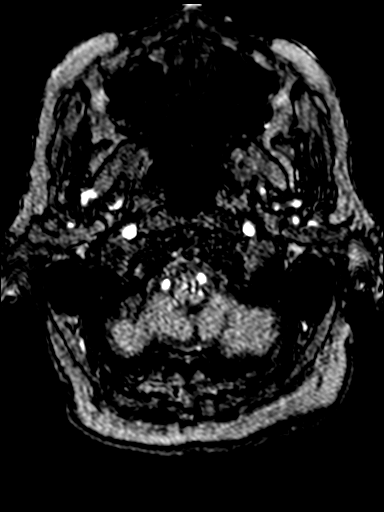
[im 15/172]
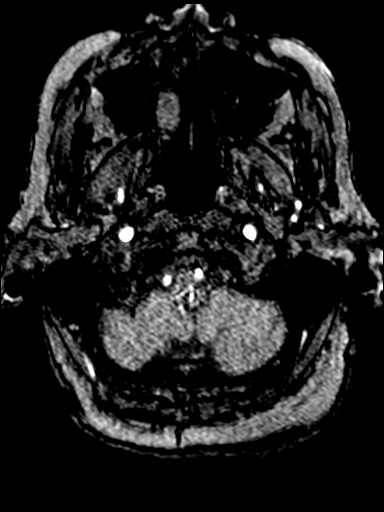
[im 19/172]
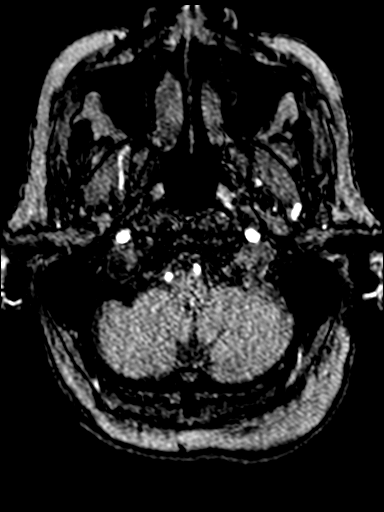
[im 22/172]
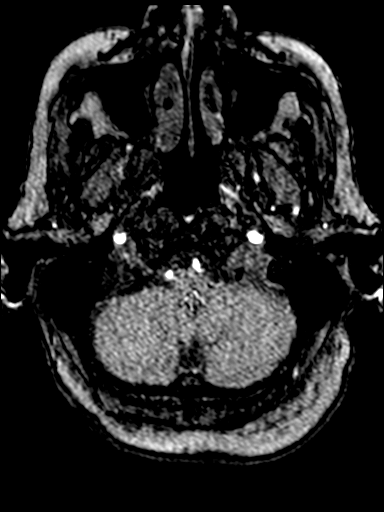
[im 26/172]
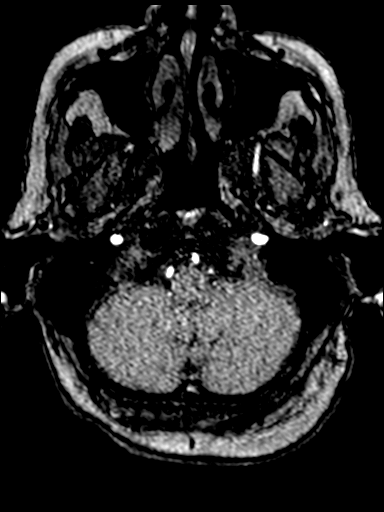
[im 30/172]
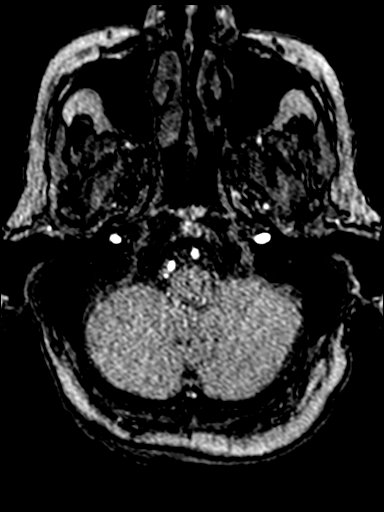
[im 33/172]
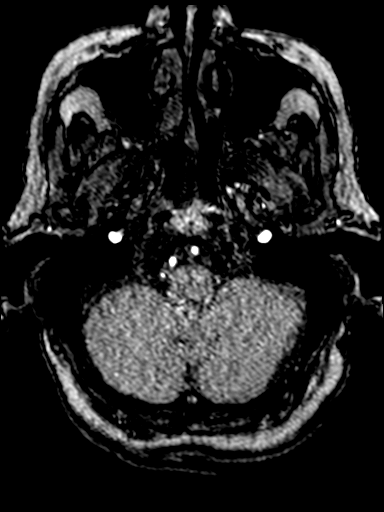
[im 37/172]
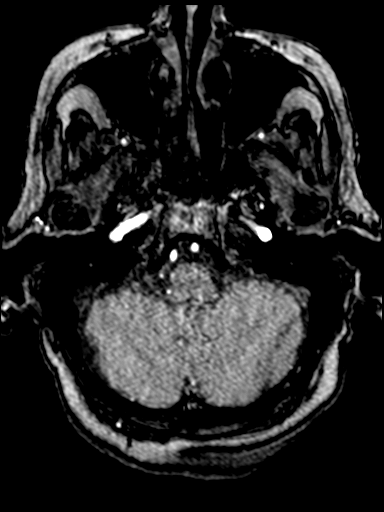
[im 41/172]
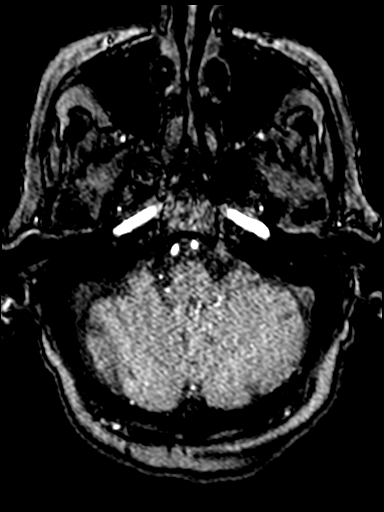
[im 44/172]
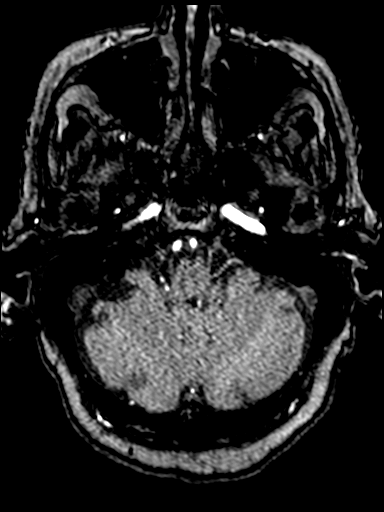
[im 48/172]
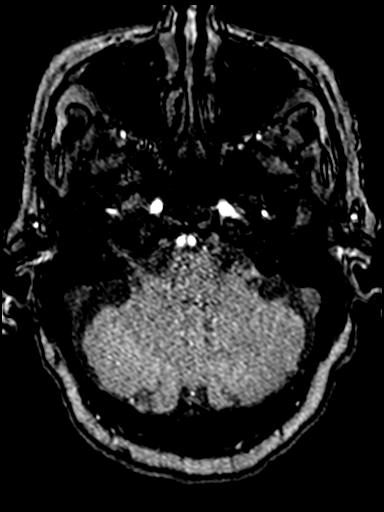
[im 51/172]
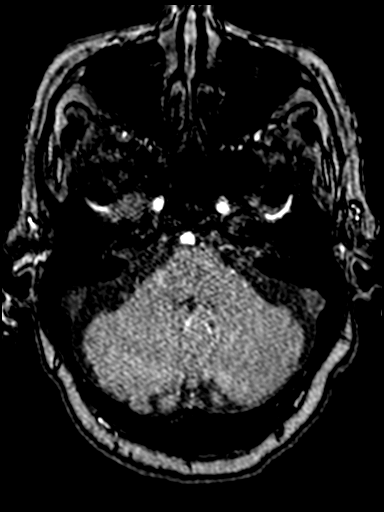
[im 55/172]
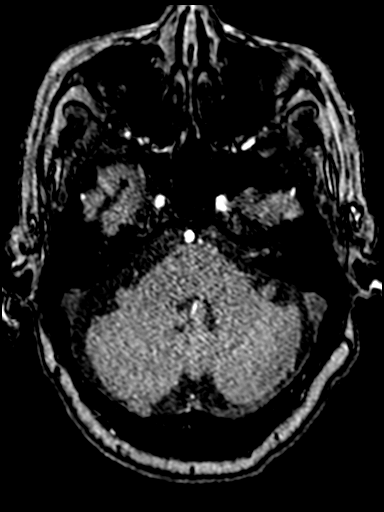
[im 59/172]
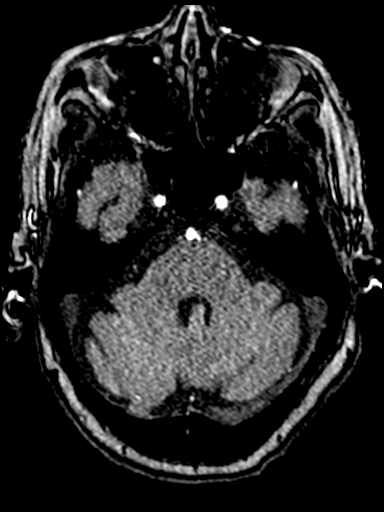
[im 77/172]
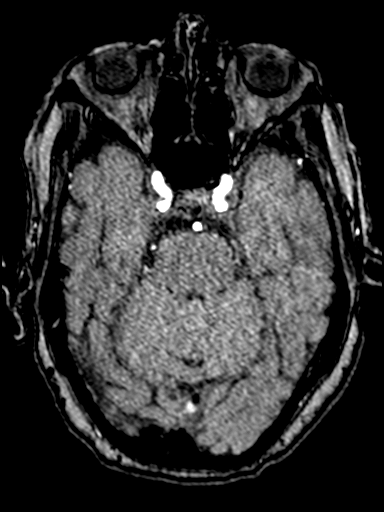
[im 88/172]
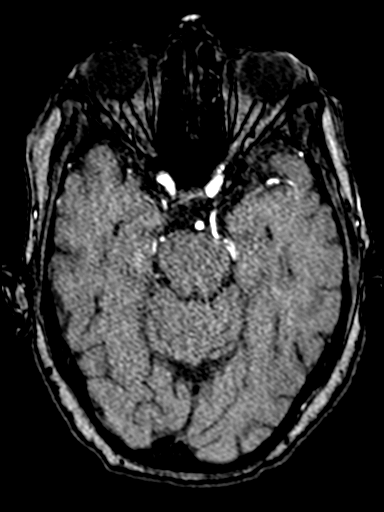
[im 99/172]
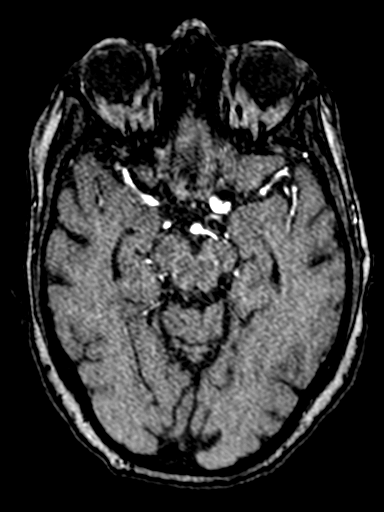
[im 121/172]
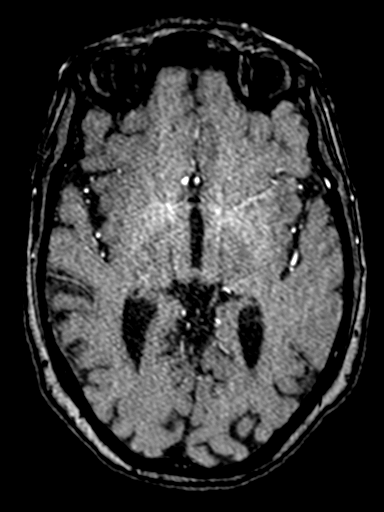
[im 142/172]
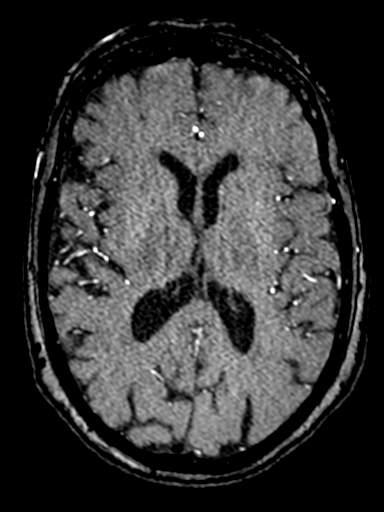
[im 146/172]
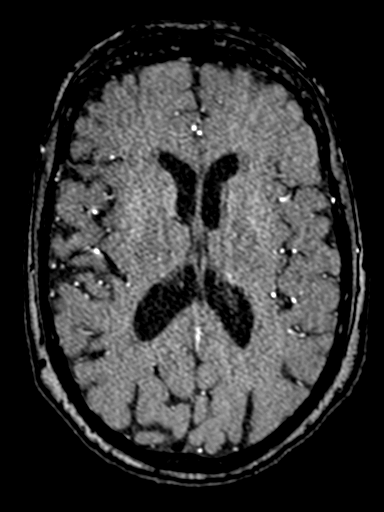
[im 164/172]
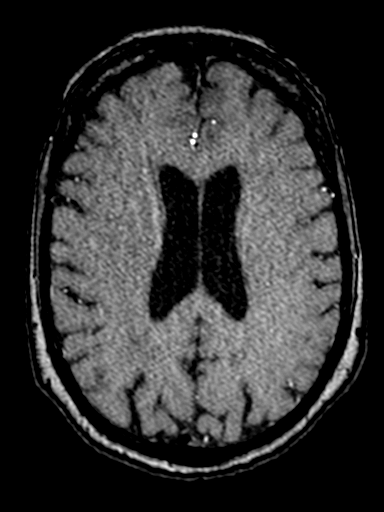

[24 of 48 positions shown; findings below may reference images not displayed]

FINDINGS: MRI HEAD

Brain: Cortical/subcortical mildly reduced diffusion in the left
perirolandic region. No evidence of intracranial hemorrhage.

Extra-axial mass along the parasagittal left frontal convexity at
the vertex measuring 2 x 1.5 cm coronally consistent with a
meningioma. This is in proximity to the superior sagittal sinus
without significant compression or invasion. Patchy and confluent
areas of T2 hyperintensity in the supratentorial white matter are
nonspecific but probably reflect moderate chronic microvascular
ischemic changes. Prominence of the ventricles and sulci reflects
mild generalized parenchymal volume loss.

No significant mass effect. There is no hydrocephalus or extra-axial
fluid collection.

Vascular: Major vessel flow voids at the skull base are preserved.

Skull and upper cervical spine: Normal marrow signal is preserved.
Degenerative changes of the cervical spine.

Sinuses/Orbits: Paranasal sinuses are aerated. Orbits are
unremarkable.

Other: Sella is unremarkable.  Mastoid air cells are clear.

MRA HEAD

Intracranial internal carotid arteries are patent. Middle and
anterior cerebral arteries are patent. Intracranial vertebral
arteries, basilar artery, posterior cerebral arteries are patent.
Left posterior communicating artery is present. There is no
significant stenosis. There is a 1-2 mm inferiorly acted outpouching
from the distal supraclinoid right ICA near the expected location of
a posterior communicating artery.

MRA NECK

Common, internal, and external carotid arteries are patent. There is
no measurable stenosis at the ICA origins. Codominant extracranial
vertebral arteries are patent without measurable stenosis or
evidence of dissection.
IMPRESSION: Small cortical/subcortical acute infarct in the left perirolandic
region. No evidence of hemorrhage.

Small left frontal convexity meningioma without parenchymal edema.

Moderate chronic microvascular ischemic changes.

No large vessel occlusion or hemodynamically significant stenosis.

1-2 mm aneurysm or infundibulum of the distal supraclinoid right
ICA.

## 2020-02-22 MED ORDER — STROKE: EARLY STAGES OF RECOVERY BOOK
Freq: Once | Status: AC
  Filled 2020-02-22 (×2): qty 1

## 2020-02-22 MED ORDER — GADOBUTROL 1 MMOL/ML IV SOLN
6.5000 mL | Freq: Once | INTRAVENOUS | Status: AC | PRN
  Administered 2020-02-22: 6.5 mL via INTRAVENOUS

## 2020-02-22 MED ORDER — SODIUM CHLORIDE 0.9% FLUSH: 3.0000 mL | Freq: Once | INTRAVENOUS | Status: DC

## 2020-02-22 MED ORDER — ASPIRIN EC 81 MG PO TBEC
81.0000 mg | DELAYED_RELEASE_TABLET | Freq: Every day | ORAL | Status: DC
  Administered 2020-02-23 – 2020-02-24 (×2): 81 mg via ORAL
  Filled 2020-02-22 (×2): qty 1

## 2020-02-22 MED ORDER — ACETAMINOPHEN 325 MG PO TABS: 650.0000 mg | ORAL_TABLET | ORAL | Status: DC | PRN

## 2020-02-22 MED ORDER — ENOXAPARIN SODIUM 30 MG/0.3ML ~~LOC~~ SOLN
30.0000 mg | SUBCUTANEOUS | Status: DC
  Administered 2020-02-23 – 2020-02-24 (×3): 30 mg via SUBCUTANEOUS
  Filled 2020-02-22 (×3): qty 0.3

## 2020-02-22 MED ORDER — ACETAMINOPHEN 650 MG RE SUPP: 650.0000 mg | RECTAL | Status: DC | PRN

## 2020-02-22 MED ORDER — ACETAMINOPHEN 160 MG/5ML PO SOLN: 650.0000 mg | ORAL | Status: DC | PRN

## 2020-02-22 NOTE — ED Notes (Signed)
Report given to Tahi on 3W, per RN unable to take pt until Covid test is resulted, pt is on MRI at this time.

## 2020-02-22 NOTE — ED Provider Notes (Signed)
[facility] EMERGENCY DEPARTMENT Provider Note   CSN: [id number] Arrival date & time: 02/22/20  1147  An emergency department physician performed an initial assessment on this suspected stroke patient at 60.  History Chief Complaint  Patient presents with  . Code Stroke    Stephanie Foster is a 87 y.o. female.  HPI Patient presents as a code stroke.  Last normal at around 1030.  Has been normal during the morning.  Ate breakfast and around.  States she went to brush her hair and realize she had difficulty moving her right arm.  No difficulty walking around.  No difficulty speaking.  No headache.  States it feels as if the arm is not there.  Questionable history of atrial fibrillation.  Patient denies being on anticoagulation.Has had COVID vaccines.    Past Medical History:  Diagnosis Date  . Cataract   . Diverticulosis   . GERD (gastroesophageal reflux disease)   . H/O hiatal hernia   . Hernia   . Hypertension     Patient Active Problem List   Diagnosis Date Noted  . Chest pain 03/24/2012  . Nausea 03/24/2012  . Diaphoresis 03/24/2012    Past Surgical History:  Procedure Laterality Date  . ABDOMINAL HYSTERECTOMY    . BLADDER SUSPENSION    . EYE SURGERY    . TONSILLECTOMY       OB History   No obstetric history on file.     Family History  Problem Relation Age of Onset  . Coronary artery disease Father   . Coronary artery disease Brother     Social History   Tobacco Use  . Smoking status: Never Smoker  . Smokeless tobacco: Never Used  Substance Use Topics  . Alcohol use: No  . Drug use: No    Home Medications Prior to Admission medications   Medication Sig Start Date End Date Taking? Authorizing Provider  Multiple Vitamin (MULTIVITAMIN) tablet Take 1 tablet by mouth daily.    [provider]  OVER THE COUNTER MEDICATION Take 1 capsule by mouth 2 (two) times daily. Total Cardio Coverage  Purchase by mail    [provider]  OVER THE COUNTER MEDICATION Take 2 Wafers by mouth at bedtime. For nervousness    Purchases  By mail  Called [facility]    [provider]  pantoprazole (PROTONIX) 40 MG tablet Take 1 tablet (40 mg total) by mouth daily with breakfast. 03/25/12   Dhungel, Nishant, MD    Allergies    Aspartame and phenylalanine, Codeine, Diovan [valsartan], Flagyl [metronidazole], Macrobid [nitrofurantoin macrocrystal], Prednisone, Sulfa antibiotics, Vitasana [actical], and Aspirin  Review of Systems   Review of Systems  Constitutional: Negative for appetite change.  HENT: Negative for congestion.   Respiratory: Negative for shortness of breath.   Cardiovascular: Negative for chest pain.  Gastrointestinal: Negative for abdominal pain.  Genitourinary: Negative for flank pain.  Musculoskeletal: Negative for back pain.  Skin: Negative for rash.  Neurological: Positive for weakness. Negative for numbness.  Psychiatric/Behavioral: Negative for confusion.    Physical Exam Updated Vital Signs BP 140/71   Pulse 85   Temp 98.3 F (36.8 C) (Oral)   Resp 19   Ht 5' (1.524 m)   Wt 66.2 kg   SpO2 97%   BMI 28.50 kg/m   Physical Exam Vitals and nursing note reviewed.  HENT:     Head: Atraumatic.  Eyes:     Extraocular Movements: Extraocular movements intact.  Pupils: Pupils are equal, round, and reactive to light.  Cardiovascular:     Rate and Rhythm: Regular rhythm.  Pulmonary:     Breath sounds: No wheezing or rhonchi.  Abdominal:     Tenderness: There is no abdominal tenderness.  Musculoskeletal:        General: No tenderness.     Cervical back: Neck supple.  Skin:    General: Skin is warm.  Neurological:     Mental Status: She is alert and oriented to person, place, and time.     Comments: Complete NIH scoring done by neurology.  Eye movements intact.  Face appears symmetric.  Normal speech.  Decreased grip strength on right compared to left.  Decreased  flexion at elbow on the right.  Good strength on lower extremities.  Difficulty with touching nose with finger on the right.  Right upper extremity drift.      ED Results / Procedures / Treatments   Labs (all labs ordered are listed, but only abnormal results are displayed) Labs Reviewed  CBC - Abnormal; Notable for the following components:      Result Value   Hemoglobin 11.1 (*)    MCV 76.1 (*)    MCH 21.9 (*)    MCHC 28.8 (*)    RDW 22.5 (*)    All other components within normal limits  I-STAT CHEM 8, ED - Abnormal; Notable for the following components:   BUN 28 (*)    Glucose, Bld 124 (*)    Calcium, Ion 1.11 (*)    All other components within normal limits  CBG MONITORING, ED - Abnormal; Notable for the following components:   Glucose-Capillary 116 (*)    All other components within normal limits  SARS CORONAVIRUS 2 (TAT 6-24 HRS)  PROTIME-INR  APTT  DIFFERENTIAL  COMPREHENSIVE METABOLIC PANEL    EKG None  Radiology CT HEAD CODE STROKE WO CONTRAST  Result Date: 02/22/2020 CLINICAL DATA:  Code stroke.  Neuro deficit, acute, stroke suspected EXAM: CT HEAD WITHOUT CONTRAST TECHNIQUE: Contiguous axial images were obtained from the base of the skull through the vertex without intravenous contrast. COMPARISON:  01/17/2009 report and prior. FINDINGS: Brain: No intracranial hemorrhage. No acute infarct. Calcified left frontal meningioma at the vertex. 0.8 cm hyperdense mass overlying the right cerebellum. No midline shift, ventriculomegaly or extra-axial fluid collection. Scattered and confluent supratentorial white matter hypodense foci are nonspecific however commonly associated with chronic microvascular ischemic changes. Mild cerebral atrophy with ex vacuo dilatation. Vascular: No hyperdense vessel or unexpected calcification. Bilateral skull base atherosclerotic calcifications. Skull: No acute finding. Sinuses/Orbits: No acute finding. Other: None. ASPECTS [facility] Stroke  Program Early CT Score) - Ganglionic level infarction (caudate, lentiform nuclei, internal capsule, insula, M1-M3 cortex): 7 - Supraganglionic infarction (M4-M6 cortex): 3 Total score (0-10 with 10 being normal): 10 IMPRESSION: 1. No acute intracranial process. 2. ASPECTS is 10. 3. Mild cerebral atrophy and chronic microvascular ischemic changes. 4. Calcified left frontal meningioma. Subcentimeter right posterior cranial fossa mass may reflect a smaller meningioma. Consider MRI for further evaluation. Code stroke imaging results were communicated on 02/22/2020 at 12:03 pm to provider Dr. Rubin Payor Via telephone, who verbally acknowledged these results. Electronically Signed   By: Stana Bunting M.D.   On: 02/22/2020 12:16    Procedures Procedures (including critical care time)  Medications Ordered in ED Medications  sodium chloride flush (NS) 0.9 % injection 3 mL (has no administration in time range)    ED Course  I have  reviewed the triage vital signs and the nursing notes.  Pertinent labs & imaging results that were available during my care of the patient were reviewed by me and considered in my medical decision making (see chart for details).    MDM Rules/Calculators/A&P                          Patient presents code stroke.  She is within the window for tPA.  Head CT only shows meningioma.  However patient has a relatively low NIH score and did not elect to get tPA.  Also did not want aspirin.  Discussed with neurology.  Recommends permissive hypertension.  Admission to hospital and stroke work-up.  Will admit to internal medicine.  CRITICAL CARE Performed by: Benjiman Core Total critical care time: 30 minutes Critical care time was exclusive of separately billable procedures and treating other patients. Critical care was necessary to treat or prevent imminent or life-threatening deterioration. Critical care was time spent personally by me on the following activities: development  of treatment plan with patient and/or surrogate as well as nursing, discussions with consultants, evaluation of patient's response to treatment, examination of patient, obtaining history from patient or surrogate, ordering and performing treatments and interventions, ordering and review of laboratory studies, ordering and review of radiographic studies, pulse oximetry and re-evaluation of patient's condition.  Final Clinical Impression(s) / ED Diagnoses Final diagnoses:  Cerebrovascular accident (CVA), unspecified mechanism [facility])    Rx / DC Orders ED Discharge Orders    None       Benjiman Core, MD 02/22/20 1233

## 2020-02-22 NOTE — Consult Note (Signed)
Neurology Code Stroke Consult   CC: CODE STROKE  History is obtained from: EMS and son  HPI: Stephanie Foster is a 87 y.o. female with minimal PMHx, including PACs on BB, , HTN, hiatal hernia, and macular degeneration. Pt presented to ED today from home (lives with son) after sudden onset of RUE/hand weakness. She got up for breakfast and was fine. She went to bathroom to fix her hair and noticed acute onset of right arm/hand weakness. No difficulty ambulating or noted RLE weakness. EMS was called and noted patient with RUE weakness and RUE drift. Pt was alert and oriented without noted aphasia, dysarthria, or facial droop. EMS brought her to ED as a CODE STROKE. BP in the field 140/106, HR 60s, CBG 140  On arrival, pt is alert and oriented. No dysarthria, neglect, aphasia or BLE or LUE weakness. Right grip is weak and she has difficulty performing FNF on right. Vision in decreased but patient has macular degeneration and is able to track.   In review of chart, her last ED visit here was 10/17 for dysuria. Last PCP visit was 8/21 for PVCs and pt was noted to be on Toprol XL. Only other meds found on that visit were calcium, and colace (besides ointment). NP nor pharmD could find any use of AC, including ASA nor Plavix. No FMHx of stroke.   She lives with her son and daughter n Social worker. She is mostly independent at home except for use of cane and sometimes walker. She can prepare meals and perform her own ADLs independently.  Pt was taken emergently to CT.   LKW: 1030 hrs tpa: Given significant RUE weakness on hand grip and elbow flexion and extension, tPA was offered and risks and benefits were explained to patient. Patient understands the risks and ebenfits. She did not feel that the R handed weakness was a signifcantly disabling defecit for her and therefore declined tPA. Decision to offer tPA was made at 1215. IR Thrombectomy No, not a candidate due to low NIHSS and very low suspicion for a  LVO.  Modified Rankin Scale 1.   NIHSS:  1a Level of Conscious: 0  1b LOC Questions: 0 1c LOC Commands: 0 2 Best Gaze: 0 3 Visual: 0 4 Facial Palsy: 0 5a Motor Arm - left: 0 5b Motor Arm - Right: 1 6a Motor Leg - Left: 0 6b Motor Leg - Right: 0 7 Limb Ataxia: 1 8 Sensory: 0 9 Best Language: 0 10 Dysarthria: 0 11 Extinct. and Inatten: 0 TOTAL: 2   ROS: Unable to perform 14 point ROS due to emergent event. No HA or dysphagia.    Past Medical History:  Diagnosis Date  . Cataract   . Diverticulosis   . GERD (gastroesophageal reflux disease)   . H/O hiatal hernia   . Hernia   . Hypertension      Family History  Problem Relation Age of Onset  . Coronary artery disease Father   . Coronary artery disease Brother   No stroke.  Social History:  reports that she has never smoked. She has never used smokeless tobacco. She reports that she does not drink alcohol and does not use drugs.   Prior to Admission medications   Medication Sig Start Date End Date Taking? Authorizing Provider  Multiple Vitamin (MULTIVITAMIN) tablet Take 1 tablet by mouth daily.    [provider]  OVER THE COUNTER MEDICATION Take 1 capsule by mouth 2 (two) times daily. Total Cardio Coverage  Purchase by mail    [provider]  OVER THE COUNTER MEDICATION Take 2 Wafers by mouth at bedtime. For nervousness    Purchases  By mail  Called [facility]    [provider]  pantoprazole (PROTONIX) 40 MG tablet Take 1 tablet (40 mg total) by mouth daily with breakfast. 03/25/12   Dhungel, Nishant, MD  Calcium supplement, Toprol XL per last PCP note.   Exam: Current vital signs: BP 137/67   Pulse 87   Temp 98.3 F (36.8 C) (Oral)   Resp (!) 21   Ht 5' (1.524 m)   Wt 66.2 kg   SpO2 97%   BMI 28.50 kg/m   Physical Exam  Constitutional: Appears well-developed and well-nourished.  Psych: Affect appropriate to situation Eyes: No scleral injection HENT: No OP  obstrucion Head: Normocephalic.  Cardiovascular: RRR on tele Respiratory: Effort normal. SaO2 99 GI: Soft.  Skin: WDI  Neuro: Mental Status: Patient is awake, alert, oriented to person, place, month, and situation. Thought the year was 2020.  Patient is able to give a clear and coherent history.  No signs of aphasia or neglect. Speech/Language: Speech is intact and fluent. No dysarthria. Comprehension, repetition, and naming intact.  Cranial Nerves: II: Visual Fields are full. Pupils are equal, round, and reactive to light. III,IV, VI: EOMI without ptosis or diploplia.  V: Facial sensation is symmetric to light touch. Able to move jaw back and forth.  VII: Smile is symmetrical. Able to puff cheeks and raise eyebrows.  VIII: hearing is intact to voice IX, X: Uvula elevates symmetrically. Phonation normal.  XI: Shoulder shrug is symmetric. XII: tongue is midline without atrophy or fasciculations.  Motor: Tone is normal. Bulk is normal. 5/5 strength was present in all four extremities except for right grip is 3+/5, right biceps 4/5, right triceps 4+/5.  Sensory: Sensation is symmetric to light touch in the arms and legs. Extinction intact.  Deep Tendon Reflexes: 2+ brachioradialis.  Plantars: Toes are downgoing bilaterally. Cerebellar: FNF ataxic on left.  Drift RUE. No drift to LUE or BLE.  Gait: deferred.   I have reviewed labs in epic and the pertinent results are: INR   1    Glucose 124  Dr. Milas Foster reviewed images independently.  NCT head showed 1. No acute intracranial process. 2. ASPECTS is 10. 3. Mild cerebral atrophy and chronic microvascular ischemic changes. 4. Calcified left frontal meningioma. Subcentimeter right posterior cranial fossa mass may reflect a smaller meningioma. Consider MRI for further evaluation.  Assessment: Stephanie Foster is a 87 y.o. left handed female with hx of PACs on BB, , HTN, hiatal hernia, and macular degeneration presenting with sudden onset  RUE weakness without numbness. NIHSS of 1 but still significant weakness on hand grip and wrist flexion and extension. Given pt's sx and possible disabling sequelae, pt was offered tPA. After thorough explanation, pt refused. Later confirmed with pt that she did not want ASA neither.  Impression: 87 yo female with CVA affecting her RUE, but mostly with hand weakness.   Plan: - MRI brain without contrast. - Recommend vascular imaging with MRA head and neck. - Recommend TTE. - Recommend labs: HbA1c, TSH. - LDL, would recommend statins if LDL > 70.(will need to discuss with patient first thou) - SBP goal - Permissive hypertension first 24 h < 220/110. Hold home medications for now. - Telemetry monitoring for arrhythmia. - Recommend bedside Swallow screen. - Recommend Stroke education. - Recommend PT/OT/SLP consult. -  Recommend metabolic/infectious workup with CBC, CMP, UA with UCx, CXR, CK, serum lactate.   Pt seen by Stephanie Norman, NP and MD. Note/plan to be edited by MD as necessary.  Pager: [phone]  NEUROHOSPITALIST ADDENDUM Performed a face to face diagnostic evaluation.   I have reviewed the contents of history and physical exam as documented by PA/ARNP/Resident and agree with above documentation.  I have discussed and formulated the above plan as documented. Edits to the note have been made as needed.  Impression: sudden onset R hand grip 0/5 and R elbow flexion and extension weakness. Given disabilng hand weakness, tPA offered and risks and benefits of tPA discussed. Patient would not consider getting tPA and outright declined it. She understands the risks and benefits. Discused if she would like to consult with her son Stephanie Foster who I had spoken to earlier but she refused. Given isolated R hand weakness without contrast Key exam findings: RUE weakness. Plan: Stroke workup, patient declined Aspirin 81mg  thou. Might benefit from confirming diagnosis with an MRI and having Pt and  OT evaluation.  This patient is critically ill and at significant risk of neurological worsening, death and care requires constant monitoring of vital signs, hemodynamics,respiratory and cardiac monitoring, neurological assessment, discussion with family, other specialists and medical decision making of high complexity. I spent 35 minutes of neurocritical care time  in the care of  this patient. This was time spent independent of any time provided by nurse practitioner or PA.  [facility] Pager Number Erick Blinks 02/22/2020  6:02 PM  If 7pm to 7am, please call on call as listed on AMION.

## 2020-02-22 NOTE — Progress Notes (Signed)
Echocardiogram 2D Echocardiogram has been performed.  Warren Lacy Aleathia Purdy 02/22/2020, 3:25 PM

## 2020-02-22 NOTE — ED Notes (Signed)
Report given to the floor, per ED CN and house coverage, pt is to be move up to the unit even with pending covid result.

## 2020-02-22 NOTE — ED Triage Notes (Signed)
Pt brought to ED by [facility] from home for sudden onset of right arm weakness and tingling. That started at 10:30 am. Pt AO x 4 on ED arrival no other neuro deficit noticed. Initial BP 140/106, HR 97, SPO2 99% RA.

## 2020-02-22 NOTE — H&P (Signed)
Date: 02/22/2020               Patient Name:  Stephanie Foster MRN: [id number]  DOB: 09-30-1924 Age / Sex: 87 y.o., female   PCP: Laurel Dimmer, MD         Medical Service: Internal Medicine Teaching Service         Attending Physician: Dr. Velna Ochs, MD    First Contact: Dr. Charleen Kirks Pager: [phone]  Second Contact: Dr. Laural Golden Pager: [phone]       After Hours (After 5p/  First Contact Pager: [phone]  weekends / holidays): Second Contact Pager: [phone]   Chief Complaint: RUE weakness  History of Present Illness: Stephanie Foster is a 87 year old female with a past medical history of hypertension, PACs, hiatal hernia and macular degeneration who presents from home today after sudden onset of right upper extremity weakness.  She states she was in her usual state of health until about 1030 this morning when she noticed acute onset of right arm and hand weakness.  She denies any right lower extremity weakness.  At this time family called EMS.  EMS noted that the patient had right upper extremity weakness and drift without any facial droop or dysarthria.   She reports yesterday she was having pain in her head but described this to be different from headache. She describes this pain as "fire", mostly on the right temporal region/around her eye. She experienced this pain intermittently all day yesterday. She does endorse blurry vision however has had this for a long time.  Denies any acute changes to her vision however does state that the pain around her right eye is new. Denies facial numbness, shortness of breath, abdominal pain, or vomitting.  She does endorses constipation and nausea.   She reports a past medical history of hypertension and hiatal hernia.  Denies any history of diabetes or high cholesterol.  She currently does not take any prescription medications.  She is taking a cranberry pill for her urinary symptoms.     Meds:   Current Meds  Medication Sig  . calcium  carbonate (TUMS - DOSED IN MG ELEMENTAL CALCIUM) 500 MG chewable tablet Chew 1-2 tablets by mouth as needed for indigestion or heartburn.  Marland Kitchen OVER THE COUNTER MEDICATION Take 1 capsule by mouth 2 (two) times daily. Total Cardio Coverage  Purchase by mail   Allergies: Allergies as of 02/22/2020 - Review Complete 02/22/2020  Allergen Reaction Noted  . Ciprofloxacin Hives and Itching 03/29/2016  . Aspartame and phenylalanine  03/24/2012  . Codeine Nausea Only 03/24/2012  . Diovan [valsartan]  03/24/2012  . Flagyl [metronidazole] Diarrhea and Nausea And Vomiting 03/24/2012  . Macrobid [nitrofurantoin macrocrystal] Nausea Only 03/24/2012  . Phenylalanine Other (See Comments) 03/24/2012  . Prednisone  03/24/2012  . Sulfa antibiotics Nausea Only 03/24/2012  . Vitasana [actical]  03/24/2012  . Aspirin Palpitations 03/24/2012   Past Medical History:  Diagnosis Date  . Cataract   . Diverticulosis   . GERD (gastroesophageal reflux disease)   . H/O hiatal hernia   . Hernia   . Hypertension    Family History:  Family History  Problem Relation Age of Onset  . Coronary artery disease Father   . Coronary artery disease Brother     Social History:  Denies alcohol, tobacco or illicit drug use. She lost one of her daughter's to cancer. Has two living son's. Lives at home with her husband. She is able to complete her  ADL's.   Review of Systems: A complete ROS was negative except as per HPI.   Physical Exam: Blood pressure (!) 146/68, pulse 84, temperature 97.8 F (36.6 C), temperature source Oral, resp. rate (!) 27, height 5' (1.524 m), weight 66.2 kg, SpO2 97 %.  Physical Exam Vitals reviewed.  Constitutional:      General: She is not in acute distress.    Appearance: Normal appearance.  HENT:     Head: Normocephalic and atraumatic.     Mouth/Throat:     Mouth: Mucous membranes are moist.     Pharynx: Oropharynx is clear. No oropharyngeal exudate or posterior oropharyngeal erythema.   Eyes:     Extraocular Movements: Extraocular movements intact.     Conjunctiva/sclera: Conjunctivae normal.     Pupils: Pupils are equal, round, and reactive to light.  Cardiovascular:     Rate and Rhythm: Normal rate and regular rhythm.     Pulses: Normal pulses.     Heart sounds: Normal heart sounds. No murmur heard. No friction rub. No gallop.   Pulmonary:     Effort: Pulmonary effort is normal. No respiratory distress.     Breath sounds: Normal breath sounds. No wheezing or rales.     Comments: Minimal bibasilar crackles Abdominal:     General: Abdomen is flat. Bowel sounds are normal. There is no distension.     Palpations: Abdomen is soft.     Tenderness: There is no abdominal tenderness. There is no guarding.  Musculoskeletal:        General: No swelling or tenderness.     Right lower leg: No edema.     Left lower leg: No edema.  Skin:    General: Skin is warm and dry.  Neurological:     Mental Status: She is alert and oriented to person, place, and time.     Cranial Nerves: No cranial nerve deficit.     Sensory: No sensory deficit.     Motor: Weakness present.     Comments: RUE strength 3/5, LUE strength 5/5, sensation intact, lower extremity strength 5/5 bilaterally  Psychiatric:        Mood and Affect: Mood normal.        Behavior: Behavior normal.        Thought Content: Thought content normal.        Judgment: Judgment normal.     EKG: personally reviewed my interpretation is low voltage, NSR, no ischemic changes  CXR: n/a  Assessment & Plan by Problem: Active Problems:   Stroke [facility])  Stephanie Foster is a 87 year old female with a past medical history of hypertension and PACs presenting with right upper extremity weakness, suspected to have a CVA.  MRI/MRA pending.  Suspected CVA Patient presented with acute onset of right upper extremity weakness, which has reportedly improved since admission.  CT head unremarkable, MRI/MRA head and neck pending.  Given  signs and symptoms on presentation, code stroke was initiated and patient was offered tPA; however she declined.  Initially patient declined aspirin due to side effects she has experienced in the past.  After further discussion patient was amenable to starting aspirin and completing MRI/MRA work-up.  Neuro work-up as below. - Neurology on board, appreciate recommendations - Follow-up MRI/MRA head and neck  - Follow-up echocardiogram  - Follow-up hemoglobin A1c and TSH - Permissive hypertension, less than 220/110 - Cardiac monitoring - PT/OT consult - Frequent neurochecks  Hx of hypertension Home medications include metoprolol succinate 25 mg daily,  however patient reports she does not take this medication.  Allowing permissive hypertension.  Continue to monitor.  Diet: NPO pending swallow eval  VTE: Enoxaparin IVF: None,None Code: Full  Prior to Admission Living Arrangement: Home, living with husband Anticipated Discharge Location: Home Barriers to Discharge: Neuro workup  Dispo: Admit patient to Inpatient with expected length of stay greater than 2 midnights.  Signed: Dr. Lerry Liner Internal Medicine PGY-2 Pager: [phone] After 5pm on weekdays and 1pm on weekends: On Call pager [phone]  02/22/2020, 4:51 PM

## 2020-02-22 NOTE — ED Notes (Signed)
Pt refuses to get TPA.

## 2020-02-22 NOTE — ED Notes (Signed)
Pt transferred to ECHO.

## 2020-02-22 NOTE — ED Notes (Signed)
Patient transported to MRI 

## 2020-02-23 ENCOUNTER — Inpatient Hospital Stay (HOSPITAL_COMMUNITY): Payer: Medicare Other

## 2020-02-23 DIAGNOSIS — I639 Cerebral infarction, unspecified: Secondary | ICD-10-CM

## 2020-02-23 DIAGNOSIS — I491 Atrial premature depolarization: Secondary | ICD-10-CM

## 2020-02-23 DIAGNOSIS — I63412 Cerebral infarction due to embolism of left middle cerebral artery: Secondary | ICD-10-CM

## 2020-02-23 LAB — CBC
HCT: 33.1 % — ABNORMAL LOW (ref 36.0–46.0)
Hemoglobin: 10.2 g/dL — ABNORMAL LOW (ref 12.0–15.0)
MCH: 22.7 pg — ABNORMAL LOW (ref 26.0–34.0)
MCHC: 30.8 g/dL (ref 30.0–36.0)
MCV: 73.7 fL — ABNORMAL LOW (ref 80.0–100.0)
Platelets: 172 10*3/uL (ref 150–400)
RBC: 4.49 MIL/uL (ref 3.87–5.11)
RDW: 22.3 % — ABNORMAL HIGH (ref 11.5–15.5)
WBC: 4.9 10*3/uL (ref 4.0–10.5)
nRBC: 0 % (ref 0.0–0.2)

## 2020-02-23 LAB — COMPREHENSIVE METABOLIC PANEL
ALT: 10 U/L (ref 0–44)
AST: 15 U/L (ref 15–41)
Albumin: 2.8 g/dL — ABNORMAL LOW (ref 3.5–5.0)
Alkaline Phosphatase: 79 U/L (ref 38–126)
Anion gap: 8 (ref 5–15)
BUN: 20 mg/dL (ref 8–23)
CO2: 25 mmol/L (ref 22–32)
Calcium: 8.4 mg/dL — ABNORMAL LOW (ref 8.9–10.3)
Chloride: 110 mmol/L (ref 98–111)
Creatinine, Ser: 0.92 mg/dL (ref 0.44–1.00)
GFR, Estimated: 58 mL/min — ABNORMAL LOW (ref >60)
Glucose, Bld: 97 mg/dL (ref 70–99)
Potassium: 4.1 mmol/L (ref 3.5–5.1)
Sodium: 143 mmol/L (ref 135–145)
Total Bilirubin: 0.9 mg/dL (ref 0.3–1.2)
Total Protein: 5.4 g/dL — ABNORMAL LOW (ref 6.5–8.1)

## 2020-02-23 LAB — TSH: TSH: 5.572 u[IU]/mL — ABNORMAL HIGH (ref 0.350–4.500)

## 2020-02-23 LAB — HEMOGLOBIN A1C
Hgb A1c MFr Bld: 5.2 % (ref 4.8–5.6)
Mean Plasma Glucose: 102.54 mg/dL

## 2020-02-23 MED ORDER — POLYSACCHARIDE IRON COMPLEX 150 MG PO CAPS
150.0000 mg | ORAL_CAPSULE | Freq: Every day | ORAL | Status: DC
  Administered 2020-02-23 – 2020-02-24 (×2): 150 mg via ORAL
  Filled 2020-02-23 (×2): qty 1

## 2020-02-23 MED ORDER — CLOPIDOGREL BISULFATE 75 MG PO TABS
75.0000 mg | ORAL_TABLET | Freq: Every day | ORAL | Status: DC
  Administered 2020-02-23 – 2020-02-24 (×2): 75 mg via ORAL
  Filled 2020-02-23 (×2): qty 1

## 2020-02-23 NOTE — Progress Notes (Addendum)
Subjective:   Stephanie Foster denies any acute complaints at this time. She states she is happy her hand is getting stronger and she has been working on it. She denies any headache, dizziness, chest pain, SOB. She notes that sometimes, she can feel that her heart has an extra beat but denies persistent palpitations.   Objective:  Vital signs in last 24 hours: Vitals:   02/23/20 0000 02/23/20 0016 02/23/20 0200 02/23/20 0400  BP: 126/75 126/75 (!) 146/65 (!) 142/75  Pulse: 75 75 67 70  Resp: 20 18 20 18   Temp: 98 F (36.7 C) 98 F (36.7 C) 97.9 F (36.6 C) 98.2 F (36.8 C)  TempSrc: Oral Oral Oral Oral  SpO2: 95% 95% 96% 96%  Weight:      Height:       Physical Exam Vitals and nursing note reviewed.  Constitutional:      General: She is not in acute distress.    Appearance: She is normal weight.  HENT:     Head: Normocephalic and atraumatic.     Mouth/Throat:     Mouth: Mucous membranes are moist.     Pharynx: Oropharynx is clear.  Eyes:     Extraocular Movements: Extraocular movements intact.     Conjunctiva/sclera: Conjunctivae normal.     Pupils: Pupils are equal, round, and reactive to light.  Cardiovascular:     Rate and Rhythm: Normal rate and regular rhythm.     Heart sounds: No murmur heard.     Comments: Regular rate but frequent extra beats Pulmonary:     Effort: Pulmonary effort is normal. No respiratory distress.  Abdominal:     General: Bowel sounds are normal. There is no distension.     Palpations: Abdomen is soft.     Tenderness: There is no abdominal tenderness.  Musculoskeletal:     Right lower leg: No edema.     Left lower leg: No edema.  Skin:    General: Skin is warm and dry.  Neurological:     Mental Status: She is alert and oriented to person, place, and time.     Comments: 5/5 strength in all extremities. Coordination intact.    Telemetry: Sinus rhythm. Frequent PACs and PVCs. No other arrhythmias noted.  Assessment/Plan:  Active  Problems:   Stroke [facility])  [name] is a 87 year old female with a past medical history of hypertension and PACs presenting with right upper extremity weakness and currently admitted for acute CVA.  # Acute Left Peri-Rolandic Ischemic Infarct  No large vessel occlusion. On MRI there is evidence of moderate chronic microvascular ischemic changes though concerning for small vessel disease. Discussed with Dr. 83, states location of CVA concerning for embolic source. Plan is for a 30 day Holter monitor on discharge and follow up with Cardiology.   - Neurology on board, appreciate recommendations - Permissive hypertension, less than 220/110 - Cardiac monitoring - LE venous duplex pending  - Lipid panel pending  - Aspirin 81 mg daily - Plavix 75 mg daily x 21 days   # Hx of Hypertension # Hx of Frequent PACs Home medications include metoprolol succinate 25 mg daily, however patient reports she does not take this medication.  Allowing permissive hypertension.  Continue to monitor. BP has been stable at this time.   # Iron Deficiency Anemia  Suspect secondary to blood loss. She has a hx of recent FOBT + stool with GI follow up; colonoscopy/endoscopy on 10/15/2019, however results unavailable on Care  Everywhere. Per patient, she was told she does not need either procedure again in the future. Hemoglobin at this time is improved compared to 4 months ago (from 7.8 to 10.2)  - Start iron supplementation   # UTI # Hx of Frequent Cystitis  UA concerning for acute cystitis in the setting of frequent cystitis in the past. She had followed with Urology and was on suppressive daily Bactrim for a time period but discontinued it. At this time, she denies any dysuria, frequent or urgency.   - Hold off on antibiotics given asymptomatic but consider Bactrim if patient develops any symptoms - Culture pending to guide antibiotics should she require them   Diet: Regular  VTE: Enoxaparin IVF:  None,None Code: Full  Prior to Admission Living Arrangement: Home, living with son  Anticipated Discharge Location: Home Barriers to Discharge: Continued work up   Dispo: Anticipated discharge in approximately 0-1 day(s).   Dr. Verdene Lennert Internal Medicine PGY-2  Pager: [phone] After 5pm on weekdays and 1pm on weekends: On Call pager [phone]  02/23/2020, 6:58 AM

## 2020-02-23 NOTE — Progress Notes (Signed)
Occupational Therapy Evaluation Patient Details Name: Stephanie Foster MRN: [id number] DOB: 1925/01/26 Today's Date: 02/23/2020    History of Present Illness Pt is a 87 y.o. female with a medical hx significant for HTN, hiatal hernia, diverticulosis, PACs, and macular degeneration who presents from home after acute R UE weakness onset and a pain in her head the previous day described as "fire" in the R temporal region around her eye. Pt declined tPA. NIHSS of 1. MRI and MRA revealed small cortical/subcortical acute infarct in L perirolandic region and small L frontal convexity meningioma without parenchymal edema along with 1-2 mm anuerysm or infundibulum of the distal suprclinoid R ICA.   Clinical Impression   Pt pleasant and agreeable to session, Ox4 with generally good safety. Demo's ability to complete basic sel care transfers with Sup and use of SPC despite reporting 'furniture cruising' in the home. Mild acute focal deficits present of decreased coordination and strength to R UE (non-dominant) leading to increased time and focus required for participation and completion of basic ADL's. Pt resides at home with son, who provides 24hr s/A as needed, and was mod indep with basic ADL's and mobility. anticipate pt to make significant gains at time of d/c with follow up OT at [facility] vs OP level to address mild deficits, with no acute OT indicated at this time. OT reviewed FM/strengthening activities to complete while in house.     Follow Up Recommendations  Home health OT;Supervision/Assistance - 24 hour (initially 24hr S/A)    Equipment Recommendations       Recommendations for Other Services Other (comment)     Precautions / Restrictions Precautions Precautions: Fall Precaution Comments: HOH Restrictions Weight Bearing Restrictions: No      Mobility Bed Mobility Overal bed mobility: Independent                  Transfers Overall transfer level: Independent Equipment used: Rolling  walker (2 wheeled);Straight cane             General transfer comment: pt presents with mod indep performance of transition to and from standing with use of SPC    Balance Overall balance assessment: Mild deficits observed, not formally tested                                         ADL either performed or assessed with clinical judgement   ADL Overall ADL's : Needs assistance/impaired     Grooming: Supervision/safety;Cueing for compensatory techniques           Upper Body Dressing : Set up;Supervision/safety   Lower Body Dressing: Supervision/safety;Sit to/from stand   Toilet Transfer: Supervision/safety   Toileting- Architect and Hygiene: Supervision/safety;Cueing for safety         General ADL Comments: Demo's sup for standing clothing management, ability to complete requested tasks but presenting with and demos decreased FM/GM.     Vision Baseline Vision/History: Wears glasses       Perception     Praxis      Pertinent Vitals/Pain Pain Assessment: No/denies pain     Hand Dominance Left   Extremity/Trunk Assessment Upper Extremity Assessment Upper Extremity Assessment: RUE deficits/detail RUE Deficits / Details: AROM WFL at all joints and planes, demo's mild coordination and strength deficits in gross grasp/FM and FM coordination RUE Coordination: decreased fine motor   Lower Extremity Assessment Lower Extremity Assessment: Defer to  PT evaluation   Cervical / Trunk Assessment Cervical / Trunk Assessment: Kyphotic   Communication Communication Communication: HOH (B hearing aids available)   Cognition Arousal/Alertness: Awake/alert Behavior During Therapy: WFL for tasks assessed/performed Overall Cognitive Status: Within Functional Limits for tasks assessed                                 General Comments: A&Ox4.   General Comments       Exercises     Shoulder Instructions      Home Living  Family/patient expects to be discharged to:: Private residence Living Arrangements: Children (Son lives with her) Available Help at Discharge: Family;Available 24 hours/day Type of Home: House Home Access: Level entry     Home Layout: One level;Able to live on main level with bedroom/bathroom;Laundry or work area in basement     [employer] Shower/Tub: Chief Strategy Officer: Handicapped height     Home Equipment: Environmental consultant - 4 wheels;Other (comment);Grab bars - tub/shower;Cane - single point;Bedside commode;Shower seat          Prior Functioning/Environment Level of Independence: Independent with assistive device(s)        Comments: preference of use of RW for mobility. Son        OT Problem List: Decreased coordination      OT Treatment/Interventions:      OT Goals(Current goals can be found in the care plan section) Acute Rehab OT Goals Patient Stated Goal: to go home  OT Frequency:     Barriers to D/C:            Co-evaluation              AM-PAC OT "6 Clicks" Daily Activity     Outcome Measure Help from another person eating meals?: None Help from another person taking care of personal grooming?: A Little Help from another person toileting, which includes using toliet, bedpan, or urinal?: None Help from another person bathing (including washing, rinsing, drying)?: A Little Help from another person to put on and taking off regular upper body clothing?: None Help from another person to put on and taking off regular lower body clothing?: None 6 Click Score: 22   End of Session Equipment Utilized During Treatment: Gait belt;Other (comment) [facility]) Nurse Communication: Mobility status  Activity Tolerance:   Patient left: in chair;with call bell/phone within reach;with chair alarm set  OT Visit Diagnosis: Unsteadiness on feet (R26.81)                Time: 1191-4782 OT Time Calculation (min): 13 min Charges:  OT General Charges $OT Visit: 1  Visit OT Evaluation $OT Eval Low Complexity: 1 Low  Aasim Restivo OTR/L acute rehab services Office: [phone]  [employer] Branae Crail 02/23/2020, 12:22 PM

## 2020-02-23 NOTE — Evaluation (Signed)
Physical Therapy Evaluation & Discharge Patient Details Name: Stephanie Foster MRN: [id number] DOB: [birth year] Today's Date: 02/23/2020   History of Present Illness  Pt is a 87 y.o. female with a medical hx significant for HTN, hiatal hernia, diverticulosis, PACs, and macular degeneration who presents from home after acute R UE weakness onset and a pain in her head the previous day described as "fire" in the R temporal region around her eye. Pt declined tPA. NIHSS of 1. MRI and MRA revealed small cortical/subcortical acute infarct in L perirolandic region and small L frontal convexity meningioma without parenchymal edema along with 1-2 mm anuerysm or infundibulum of the distal suprclinoid R ICA.  Clinical Impression  Pt presents with condition mentioned above. PTA pt was independent with all functional mob utilizing furniture for balance with mobility within the home and a [facility] for balance in community. She has 24/7 assistance and transportation available from her son. Pt appears to be at her baseline level of function, displaying symmetrical and intact bilat leg strength, sensation to light touch, and coordination. She does demonstrate slightly delayed dynamic proprioception in her R ankle though. All education provided and questions answered. PT will sign off at this time.    Follow Up Recommendations No PT follow up    Equipment Recommendations   (pt requests a w/c)    Recommendations for Other Services       Precautions / Restrictions Precautions Precautions: Fall Precaution Comments: HOH Restrictions Weight Bearing Restrictions: No      Mobility  Bed Mobility Overal bed mobility: Independent             General bed mobility comments: Pt able to perform all bed mob safely with extra time with bed flat and no use of rails.    Transfers Overall transfer level: Independent Equipment used: Rolling walker (2 wheeled);Straight cane             General transfer comment: Pt able  to come to stand safely with use of RW or SPC, no LOB.  Ambulation/Gait Ambulation/Gait assistance: Supervision Gait Distance (Feet): 200 Feet Assistive device: Rolling walker (2 wheeled);Straight cane Gait Pattern/deviations: Step-through pattern;Decreased stride length;Staggering left;Staggering right;Trunk flexed Gait velocity: WFL Gait velocity interpretation: 1.31 - 2.62 ft/sec, indicative of limited community ambulator General Gait Details: Ambulates at slow pace, but able to increase as needed. Tendency to stagger minimally either direction, which she reports is her normal at home. Progressed from RW to [facility] in L hand with no change in balance and no overt LOB. Supervision for safety.  Stairs Stairs: Yes Stairs assistance: Supervision Stair Management: Two rails;Alternating pattern Number of Stairs: 2 General stair comments: Ascending and descending 2 stairs with bilat hand rails to simulate home set-up with pt able to negotiate stairs with reciprocal gait pattern and no LOB. Increased time and slight turn to side descending, but pt reports this is her normal at home. Supervision for safety.  Wheelchair Mobility    Modified Rankin (Stroke Patients Only) Modified Rankin (Stroke Patients Only) Pre-Morbid Rankin Score: No symptoms Modified Rankin: Moderately severe disability     Balance Overall balance assessment: Mild deficits observed, not formally tested                                           Pertinent Vitals/Pain Pain Assessment: No/denies pain    Home Living Family/patient expects to be discharged  to:: Private residence Living Arrangements: Children (son ~31 y.o.) Available Help at Discharge: Family;Available 24 hours/day Type of Home: House Home Access: Level entry     Home Layout: One level;Able to live on main level with bedroom/bathroom;Laundry or work area in basement (2 steps between "great room" and kitchen with bilat hand rails she can  reach at both times) Home Equipment: Environmental consultant - 4 wheels;Other (comment);Grab bars - tub/shower;Cane - single point;Bedside commode;Shower seat (3-wheeled walker; sleeps in recliner chair)      Prior Function Level of Independence: Independent with assistive device(s)         Comments: In home, utilizes furniture for balance intermittently for mobility and uses SPC in community. Independent with use of AD/AE for all mobility and ADLs. Pt receives transportation from son.     Hand Dominance   Dominant Hand: Left    Extremity/Trunk Assessment   Upper Extremity Assessment Upper Extremity Assessment: Defer to OT evaluation    Lower Extremity Assessment Lower Extremity Assessment: RLE deficits/detail RLE Deficits / Details: MMT on bilat legs symmetrical 4 to 5 grossly; Sensation to light touch intact in bilat legs; coordination intact in bilat legs; Slightly delayed dynamic propricoeption noted in R ankle though RLE Sensation: decreased proprioception (delayed dynamic proprioception in ankle compared to L) RLE Coordination: WNL    Cervical / Trunk Assessment Cervical / Trunk Assessment: Kyphotic  Communication   Communication: HOH  Cognition Arousal/Alertness: Awake/alert Behavior During Therapy: WFL for tasks assessed/performed Overall Cognitive Status: Within Functional Limits for tasks assessed                                 General Comments: A&Ox4.      General Comments      Exercises     Assessment/Plan    PT Assessment Patent does not need any further PT services  PT Problem List         PT Treatment Interventions      PT Goals (Current goals can be found in the Care Plan section)  Acute Rehab PT Goals Patient Stated Goal: to go home and get a w/c for days she has difficulty ambulating PT Goal Formulation: With patient Time For Goal Achievement: 02/25/20 Potential to Achieve Goals: Good    Frequency     Barriers to discharge         Co-evaluation               AM-PAC PT "6 Clicks" Mobility  Outcome Measure Help needed turning from your back to your side while in a flat bed without using bedrails?: None Help needed moving from lying on your back to sitting on the side of a flat bed without using bedrails?: None Help needed moving to and from a bed to a chair (including a wheelchair)?: None Help needed standing up from a chair using your arms (e.g., wheelchair or bedside chair)?: None Help needed to walk in hospital room?: A Little Help needed climbing 3-5 steps with a railing? : A Little 6 Click Score: 22    End of Session Equipment Utilized During Treatment: Gait belt Activity Tolerance: Patient tolerated treatment well Patient left: in chair;with call bell/phone within reach;Other (comment) (with OT entering) Nurse Communication: Mobility status PT Visit Diagnosis: Unsteadiness on feet (R26.81);Other abnormalities of gait and mobility (R26.89)    Time: 3086-5784 PT Time Calculation (min) (ACUTE ONLY): 35 min   Charges:   PT Evaluation $PT  Eval Low Complexity: 1 Low PT Treatments $Gait Training: 8-22 mins        Raymond Gurney, PT, DPT Acute Rehabilitation Services  Pager: [phone] Office: [phone]   Jewel Baize 02/23/2020, 9:15 AM

## 2020-02-23 NOTE — Progress Notes (Addendum)
STROKE TEAM PROGRESS NOTE   INTERVAL HISTORY Her son is at the bedside.  Pt lying in bed, still has mild right hand weakness but stated that she felt a little bit better from yesterday. She and her son did stated that she had irregular heart beat, more likely PACs documented in chart, however, her stroke this time is emoblic and concerning for occult afib. Will do 30 day cardiac event monitoring and pt agreed and she would like to follow up with cardiologist in [facility]. We will set it up.    OBJECTIVE Vitals:   02/23/20 0000 02/23/20 0016 02/23/20 0200 02/23/20 0400  BP: 126/75 126/75 (!) 146/65 (!) 142/75  Pulse: 75 75 67 70  Resp: 20 18 20 18   Temp: 98 F (36.7 C) 98 F (36.7 C) 97.9 F (36.6 C) 98.2 F (36.8 C)  TempSrc: Oral Oral Oral Oral  SpO2: 95% 95% 96% 96%  Weight:      Height:        CBC:  Recent Labs  Lab 02/22/20 1151 02/22/20 1157 02/23/20 0445  WBC 5.6  --  4.9  NEUTROABS 3.3  --   --   HGB 11.1* 12.6 10.2*  HCT 38.5 37.0 33.1*  MCV 76.1*  --  73.7*  PLT 183  --  172    Basic Metabolic Panel:  Recent Labs  Lab 02/22/20 1151 02/22/20 1157 02/23/20 0445  NA 142 144 143  K 3.9 3.8 4.1  CL 107 108 110  CO2 23  --  25  GLUCOSE 128* 124* 97  BUN 25* 28* 20  CREATININE 1.03* 1.00 0.92  CALCIUM 8.6*  --  8.4*    Lipid Panel:     Component Value Date/Time   CHOL 194 03/25/2012 0538   TRIG 73 03/25/2012 0538   HDL 81 03/25/2012 0538   CHOLHDL 2.4 03/25/2012 0538   VLDL 15 03/25/2012 0538   LDLCALC 98 03/25/2012 0538   HgbA1c:  Lab Results  Component Value Date   HGBA1C 5.2 02/23/2020   Urine Drug Screen: No results found for: LABOPIA, COCAINSCRNUR, LABBENZ, AMPHETMU, THCU, LABBARB  Alcohol Level No results found for: [facility]  IMAGING  CT HEAD CODE STROKE WO CONTRAST 02/22/2020 IMPRESSION:  1. No acute intracranial process.  2. ASPECTS is 10.  3. Mild cerebral atrophy and chronic microvascular ischemic changes.  4. Calcified left frontal  meningioma. Subcentimeter right posterior cranial fossa mass may reflect a smaller meningioma. Consider MRI for further evaluation.   MR BRAIN WO CONTRAST MR ANGIO HEAD WO CONTRAST MR ANGIO NECK W WO CONTRAST 02/22/2020 IMPRESSION:  Small cortical/subcortical acute infarct in the left perirolandic region. No evidence of hemorrhage. Small left frontal convexity meningioma without parenchymal edema. Moderate chronic microvascular ischemic changes. No large vessel occlusion or hemodynamically significant stenosis. 1-2 mm aneurysm or infundibulum of the distal supraclinoid right ICA.   ECHOCARDIOGRAM COMPLETE 02/22/2020 IMPRESSIONS   1. Left ventricular ejection fraction, by estimation, is 55%. The left ventricle has normal function. The left ventricle has no regional wall motion abnormalities. Left ventricular diastolic parameters are consistent with Grade I diastolic dysfunction (impaired relaxation).   2. Right ventricular systolic function is normal. The right ventricular size is normal. There is normal pulmonary artery systolic pressure. The estimated right ventricular systolic pressure is 26.7 mmHg.   3. Left atrial size was moderately dilated.   4. Right atrial size was mildly dilated.   5. The mitral valve is normal in structure. Mild mitral valve regurgitation.  No evidence of mitral stenosis.   6. The aortic valve is tricuspid. Aortic valve regurgitation is not visualized. No aortic stenosis is present.   7. The inferior vena cava is normal in size with <50% respiratory variability, suggesting right atrial pressure of 8 mmHg.   ECG - SR rate 85 BPM. (See cardiology reading for complete details)  PHYSICAL EXAM  Temp:  [97.8 F (36.6 C)-98.3 F (36.8 C)] 98 F (36.7 C) (01/08 1143) Pulse Rate:  [67-141] 71 (01/08 1143) Resp:  [14-38] 16 (01/08 1143) BP: (126-146)/(61-76) 132/70 (01/08 1143) SpO2:  [95 %-99 %] 96 % (01/08 1143)  General - Well nourished, well developed, in no apparent  distress.  Ophthalmologic - fundi not visualized due to noncooperation.  Cardiovascular - Regular rhythm and rate.  Mental Status -  Level of arousal and orientation to time, place, and person were intact. Language including expression, naming, repetition, comprehension was assessed and found intact.  Cranial Nerves II - XII - II - Visual field intact OU. III, IV, VI - Extraocular movements intact. V - Facial sensation intact bilaterally. VII - Facial movement intact bilaterally. VIII - Hearing & vestibular intact bilaterally. X - Palate elevates symmetrically. XI - Chin turning & shoulder shrug intact bilaterally. XII - Tongue protrusion intact.  Motor Strength - The patient's strength was normal in all extremities and pronator drift was absent except decreased right wrist extension 4/5 and finger grip 4/5 with decreased right hand dexterity. Bulk was normal and fasciculations were absent.   Motor Tone - Muscle tone was assessed at the neck and appendages and was normal.  Reflexes - The patient's reflexes were symmetrical in all extremities and she had no pathological reflexes.  Sensory - Light touch, temperature/pinprick were assessed and were symmetrical.    Coordination - The patient had normal movements in the hands with no ataxia or dysmetria.  Tremor was absent.  Gait and Station - deferred.   ASSESSMENT/PLAN [name] is a 87 y.o. female with history of  PACs on BB, HTN, hiatal hernia, and macular degeneration (vision in decreased). Pt presented to ED today from home (lives with son) after sudden onset of RUE/hand weakness. The patient received declined tPA due to risks vs benefits.  Stroke: small cortical acute infarct in the left perirolandic region, embolic pattern, source unclear, highly concerning for occult AF  CT Head - No acute intracranial process.  ASPECTS is 10. Mild cerebral atrophy and chronic microvascular ischemic changes.    MRI head - Small  cortical acute infarct in the left perirolandic region. No evidence of hemorrhage. Small left frontal convexity meningioma without parenchymal edema.   MRA Head and Neck - No large vessel occlusion or hemodynamically significant stenosis. 1-2 mm aneurysm or infundibulum of the distal supraclinoid right ICA.   2D Echo - EF 55%. No cardiac source of emboli identified.   LE venous doppler pending  Recommend 30 day cardiac event monitoring as outpt  [employer] Virus 2 - negative  LDL - pending  HgbA1c - 5.2  VTE prophylaxis - Lovenox  No antithrombotic prior to admission, now on aspirin 81 mg daily and clopidogrel 75 mg daily. Continue DAPT for 3 weeks and then ASA alone.  Patient will be counseled to be compliant with her antithrombotic medications  Ongoing aggressive stroke risk factor management  Therapy recommendations:  HH  Disposition:  Pending  arrythmia   Pt admits "skip beats"  Per son and chart, pt has PACs  Given  the current stroke, concerning for PAF  Will recommend 30 day cardiac event monitoring as oupt  Pt no cardiologist at this time, will refer to [facility] in [facility].  Hypertension  Home BP meds: metoprolol  Stable . Permissive hypertension (OK if < 220/120) for 24-48h and then gradually normalize in 2-3 days  . Long-term BP goal normotensive  Hyperlipidemia  Home Lipid lowering medication: none   LDL pending, goal < 70  Will consider statin based on LDL   Other Stroke Risk Factors  Advanced age  Other Active Problems, Findings and Recommendations  Code status - Full code  1-2 mm aneurysm or infundibulum of the distal supraclinoid right ICA.   Hospital day # 1   Marvel Plan, MD PhD Stroke Neurology 02/23/2020 12:53 PM    To contact Stroke Continuity provider, please refer to [email or web address]. After hours, contact General Neurology

## 2020-02-23 NOTE — Progress Notes (Signed)
VASCULAR LAB    Bilateral lower extremity venous duplex has been performed.  See CV proc for preliminary results.   Dhyana Bastone, RVT 02/23/2020, 3:20 PM

## 2020-02-23 NOTE — Evaluation (Signed)
Speech Language Pathology Evaluation Patient Details Name: LYCIA SACHDEVA MRN: [id number] DOB: 06/18/1924 Today's Date: 02/23/2020 Time: 2505-3976 SLP Time Calculation (min) (ACUTE ONLY): 14 min  Problem List:  Patient Active Problem List   Diagnosis Date Noted  . Stroke (HCC) 02/22/2020  . Chest pain 03/24/2012  . Nausea 03/24/2012  . Diaphoresis 03/24/2012   Past Medical History:  Past Medical History:  Diagnosis Date  . Cataract   . Diverticulosis   . GERD (gastroesophageal reflux disease)   . H/O hiatal hernia   . Hernia   . Hypertension    Past Surgical History:  Past Surgical History:  Procedure Laterality Date  . ABDOMINAL HYSTERECTOMY    . BLADDER SUSPENSION    . EYE SURGERY    . TONSILLECTOMY     HPI:  87 y.o. female with a medical hx significant for HTN, hiatal hernia, diverticulosis, PACs, and macular degeneration who presents from home after acute R UE weakness onset and a pain in her head the previous day described as "fire" in the R temporal region around her eye. Pt declined tPA. NIHSS of 1. MRI and MRA revealed small cortical/subcortical acute infarct in L perirolandic region and small L frontal convexity meningioma without parenchymal edema along with 1-2 mm anuerysm or infundibulum of the distal suprclinoid R ICA   Assessment / Plan / Recommendation Clinical Impression  Pt appears at baseline for cognitive linguistic functioning. Spouse present who also confirms no change in speech, language or thinking skills. Pt fully oriented, able to recall pertinent medical information. PLOF pt lives with spouse and is independent with ADLs (does not drive). Receptive and expressive language skills were intact. Motor speech skills also intact. No further ST needs identified.    SLP Assessment  SLP Recommendation/Assessment: Patient does not need any further Speech Lanaguage Pathology Services SLP Visit Diagnosis: Cognitive communication deficit (R41.841)    Follow Up  Recommendations       Frequency and Duration           SLP Evaluation Cognition  Overall Cognitive Status: Within Functional Limits for tasks assessed Arousal/Alertness: Awake/alert Orientation Level: Oriented X4 Attention: Focused;Sustained Focused Attention: Appears intact Sustained Attention: Appears intact Memory: Appears intact Awareness: Appears intact Problem Solving: Appears intact Executive Function: Landscape architect: Appears intact Safety/Judgment: Appears intact       Comprehension  Auditory Comprehension Overall Auditory Comprehension: Appears within functional limits for tasks assessed    Expression Expression Primary Mode of Expression: Verbal Verbal Expression Overall Verbal Expression: Appears within functional limits for tasks assessed Written Expression Dominant Hand: Left   Oral / Motor  Oral Motor/Sensory Function Overall Oral Motor/Sensory Function: Within functional limits Motor Speech Overall Motor Speech: Appears within functional limits for tasks assessed   GO                    Jackqueline Aquilar E Chiamaka Latka MA, CCC-SLP Acute Rehabilitation Services  02/23/2020, 3:00 PM

## 2020-02-23 NOTE — Plan of Care (Signed)

## 2020-02-24 ENCOUNTER — Telehealth: Payer: Self-pay | Admitting: Internal Medicine

## 2020-02-24 DIAGNOSIS — E7800 Pure hypercholesterolemia, unspecified: Secondary | ICD-10-CM

## 2020-02-24 DIAGNOSIS — I63412 Cerebral infarction due to embolism of left middle cerebral artery: Secondary | ICD-10-CM

## 2020-02-24 LAB — LIPID PANEL
Cholesterol: 179 mg/dL (ref 0–200)
HDL: 70 mg/dL (ref >40)
LDL Cholesterol: 99 mg/dL (ref 0–99)
Total CHOL/HDL Ratio: 2.6 RATIO
Triglycerides: 51 mg/dL (ref <150)
VLDL: 10 mg/dL (ref 0–40)

## 2020-02-24 MED ORDER — ASPIRIN 81 MG PO TBEC: 81.0000 mg | DELAYED_RELEASE_TABLET | Freq: Every day | ORAL | 11 refills | Status: DC

## 2020-02-24 MED ORDER — POLYSACCHARIDE IRON COMPLEX 150 MG PO CAPS: 150.0000 mg | ORAL_CAPSULE | Freq: Every day | ORAL | 0 refills | Status: DC

## 2020-02-24 MED ORDER — ATORVASTATIN CALCIUM 20 MG PO TABS: 20.0000 mg | ORAL_TABLET | Freq: Every day | ORAL | 0 refills | Status: DC

## 2020-02-24 MED ORDER — CLOPIDOGREL BISULFATE 75 MG PO TABS: 75.0000 mg | ORAL_TABLET | Freq: Every day | ORAL | 0 refills | Status: AC

## 2020-02-24 MED ORDER — ATORVASTATIN CALCIUM 10 MG PO TABS
20.0000 mg | ORAL_TABLET | Freq: Every day | ORAL | Status: DC
  Administered 2020-02-24: 20 mg via ORAL
  Filled 2020-02-24: qty 2

## 2020-02-24 NOTE — Progress Notes (Signed)
STROKE TEAM PROGRESS NOTE   INTERVAL HISTORY Her son is at the bedside.  Pt lying in bed, her right hand improved from yesterday but still has mild dexterity difficulty, not able to put her hearing aid in at the right ear. LE venous doppler neg. LDL 99 and will put on low dose lipitor.    OBJECTIVE Vitals:   02/23/20 2121 02/24/20 0007 02/24/20 0008 02/24/20 0313  BP: (!) 148/62 (!) 167/134 (!) 160/80 (!) 144/72  Pulse: 76 71 75 80  Resp: 18 18 18 18   Temp: 98.2 F (36.8 C) 97.8 F (36.6 C) 98.1 F (36.7 C) 98.3 F (36.8 C)  TempSrc: Oral Oral Oral Oral  SpO2: 95% 97% 95% 97%  Weight:      Height:        CBC:  Recent Labs  Lab 02/22/20 1151 02/22/20 1157 02/23/20 0445  WBC 5.6  --  4.9  NEUTROABS 3.3  --   --   HGB 11.1* 12.6 10.2*  HCT 38.5 37.0 33.1*  MCV 76.1*  --  73.7*  PLT 183  --  172    Basic Metabolic Panel:  Recent Labs  Lab 02/22/20 1151 02/22/20 1157 02/23/20 0445  NA 142 144 143  K 3.9 3.8 4.1  CL 107 108 110  CO2 23  --  25  GLUCOSE 128* 124* 97  BUN 25* 28* 20  CREATININE 1.03* 1.00 0.92  CALCIUM 8.6*  --  8.4*    Lipid Panel:     Component Value Date/Time   CHOL 179 02/24/2020 0154   TRIG 51 02/24/2020 0154   HDL 70 02/24/2020 0154   CHOLHDL 2.6 02/24/2020 0154   VLDL 10 02/24/2020 0154   LDLCALC 99 02/24/2020 0154   HgbA1c:  Lab Results  Component Value Date   HGBA1C 5.2 02/23/2020   Urine Drug Screen: No results found for: LABOPIA, COCAINSCRNUR, LABBENZ, AMPHETMU, THCU, LABBARB  Alcohol Level No results found for: [facility]  IMAGING  CT HEAD CODE STROKE WO CONTRAST 02/22/2020 IMPRESSION:  1. No acute intracranial process.  2. ASPECTS is 10.  3. Mild cerebral atrophy and chronic microvascular ischemic changes.  4. Calcified left frontal meningioma. Subcentimeter right posterior cranial fossa mass may reflect a smaller meningioma. Consider MRI for further evaluation.   MR BRAIN WO CONTRAST MR ANGIO HEAD WO CONTRAST MR ANGIO  NECK W WO CONTRAST 02/22/2020 IMPRESSION:  Small cortical/subcortical acute infarct in the left perirolandic region. No evidence of hemorrhage. Small left frontal convexity meningioma without parenchymal edema. Moderate chronic microvascular ischemic changes. No large vessel occlusion or hemodynamically significant stenosis. 1-2 mm aneurysm or infundibulum of the distal supraclinoid right ICA.   ECHOCARDIOGRAM COMPLETE 02/22/2020 IMPRESSIONS   1. Left ventricular ejection fraction, by estimation, is 55%. The left ventricle has normal function. The left ventricle has no regional wall motion abnormalities. Left ventricular diastolic parameters are consistent with Grade I diastolic dysfunction (impaired relaxation).   2. Right ventricular systolic function is normal. The right ventricular size is normal. There is normal pulmonary artery systolic pressure. The estimated right ventricular systolic pressure is 26.7 mmHg.   3. Left atrial size was moderately dilated.   4. Right atrial size was mildly dilated.   5. The mitral valve is normal in structure. Mild mitral valve regurgitation. No evidence of mitral stenosis.   6. The aortic valve is tricuspid. Aortic valve regurgitation is not visualized. No aortic stenosis is present.   7. The inferior vena cava is normal in size  with <50% respiratory variability, suggesting right atrial pressure of 8 mmHg.   ECG - SR rate 85 BPM. (See cardiology reading for complete details)  PHYSICAL EXAM  Temp:  [97.8 F (36.6 C)-98.3 F (36.8 C)] 98.3 F (36.8 C) (01/09 0313) Pulse Rate:  [71-80] 80 (01/09 0313) Resp:  [16-18] 18 (01/09 0313) BP: (126-167)/(61-134) 144/72 (01/09 0313) SpO2:  [95 %-97 %] 97 % (01/09 0313)  General - Well nourished, well developed, in no apparent distress.  Ophthalmologic - fundi not visualized due to noncooperation.  Cardiovascular - Regular rhythm and rate.  Mental Status -  Level of arousal and orientation to time, place, and  person were intact. Language including expression, naming, repetition, comprehension was assessed and found intact.  Cranial Nerves II - XII - II - Visual field intact OU. III, IV, VI - Extraocular movements intact. V - Facial sensation intact bilaterally. VII - Facial movement intact bilaterally. VIII - Hearing & vestibular intact bilaterally. X - Palate elevates symmetrically. XI - Chin turning & shoulder shrug intact bilaterally. XII - Tongue protrusion intact.  Motor Strength - The patient's strength was normal in all extremities and pronator drift was absent except decreased right wrist extension 4+/5 and finger grip 4+/5 with decreased right hand dexterity. Bulk was normal and fasciculations were absent.   Motor Tone - Muscle tone was assessed at the neck and appendages and was normal.  Reflexes - The patient's reflexes were symmetrical in all extremities and she had no pathological reflexes.  Sensory - Light touch, temperature/pinprick were assessed and were symmetrical.    Coordination - The patient had normal movements in the hands with no ataxia or dysmetria.  Tremor was absent.  Gait and Station - deferred.   ASSESSMENT/PLAN Ms. KIRSTEIN BAXLEY is a 87 y.o. female with history of  PACs on BB, HTN, hiatal hernia, and macular degeneration (vision in decreased). Pt presented to ED today from home (lives with son) after sudden onset of RUE/hand weakness. The patient received declined tPA due to risks vs benefits.  Stroke: small cortical acute infarct in the left perirolandic region, embolic pattern, source unclear, highly concerning for occult AF  CT Head - No acute intracranial process.  ASPECTS is 10. Mild cerebral atrophy and chronic microvascular ischemic changes.    MRI head - Small cortical acute infarct in the left perirolandic region. No evidence of hemorrhage. Small left frontal convexity meningioma without parenchymal edema.   MRA Head and Neck - No large vessel  occlusion or hemodynamically significant stenosis. 1-2 mm aneurysm or infundibulum of the distal supraclinoid right ICA.   2D Echo - EF 55%. No cardiac source of emboli identified.   LE venous doppler no DVT  Recommend 30 day cardiac event monitoring as outpt  [employer] Virus 2 - negative  LDL 99  HgbA1c - 5.2  VTE prophylaxis - Lovenox  No antithrombotic prior to admission, now on aspirin 81 mg daily and clopidogrel 75 mg daily. Continue DAPT for 3 weeks and then ASA alone.  Patient will be counseled to be compliant with her antithrombotic medications  Ongoing aggressive stroke risk factor management  Therapy recommendations:  HH  Disposition:  Pending  arrythmia   Pt admits "skip beats"  Per son and chart, pt has PACs  Given the current stroke, concerning for PAF  Will recommend 30 day cardiac event monitoring as oupt  Pt no cardiologist at this time, will refer to [facility] in [facility].  Hypertension  Home  BP meds: metoprolol  Stable . Long-term BP goal normotensive  Hyperlipidemia  Home Lipid lowering medication: none   LDL 99, goal < 70  Will start low dose lipitor 20, given advance age and not significantly high LDL  Continue statin on discharge   Other Stroke Risk Factors  Advanced age  Other Active Problems, Findings and Recommendations  Code status - Full code  1-2 mm aneurysm or infundibulum of the distal supraclinoid right ICA.   Hospital day # 2  Neurology will sign off. Please call with questions. Pt will follow up with stroke clinic NP at [facility] in about 4 weeks. Thanks for the consult.  Marvel Plan, MD PhD Stroke Neurology 02/24/2020 1:19 PM   To contact Stroke Continuity provider, please refer to [email or web address]. After hours, contact General Neurology

## 2020-02-24 NOTE — Progress Notes (Signed)
Wheelchair:  Patient suffers from stroke which impairs their ability to perform daily activities like ambulating in the home. A walker will not resolve  issue with performing activities of daily living. A wheelchair will allow patient to safely perform daily activities. Patient is not able to propel themselves in the home using a standard weight wheelchair due to weakness. Patient can self propel in the lightweight wheelchair. Length of need lifetime.  Accessories: elevating leg rests (ELRs), wheel locks, extensions and anti-tippers.

## 2020-02-24 NOTE — Telephone Encounter (Signed)
Took call from  Internal medicine resident  Pt had CVA Needs 30 day monitor per neruology Please have sent to patient

## 2020-02-24 NOTE — TOC Initial Note (Signed)
Transition of Care [facility]) - Initial/Assessment Note    Patient Details  Name: Stephanie Foster MRN: [id number] Date of Birth: 1924-10-08  Transition of [facility]) CM/SW Contact:    Kermit Balo, RN Phone Number: 02/24/2020, 11:29 AM  Clinical Narrative:                 Pt with orders for [facility] services. She states she has had HH in the past and prefers not to have them this time. She states she and her daughter in law do their own exercising and feel this is adequate.  Pt is requesting a wheelchair for home. CM has updated the MD and called the DME in to [employer] who will deliver the DME to the room. Pt has transport home when medically ready. TOC following.  Expected Discharge Plan: Home/Self Care Barriers to Discharge: Continued Medical Work up   Patient Goals and CMS Choice   CMS Medicare.gov Compare Post Acute Care list provided to:: Patient Choice offered to / list presented to : Patient  Expected Discharge Plan and Services Expected Discharge Plan: Home/Self Care   Discharge Planning Services: CM Consult Post Acute Care Choice: Durable Medical Equipment Living arrangements for the past 2 months: Single Family Home                 DME Arranged: Wheelchair manual DME Agency: [employer] Date DME Agency Contacted: 02/24/20   Representative spoke with at DME Agency: Delorise Jackson            Prior Living Arrangements/Services Living arrangements for the past 2 months: Single Family Home Lives with:: Adult Children Patient language and need for interpreter reviewed:: Yes Do you feel safe going back to the place where you live?: Yes      Need for Family Participation in Patient Care: Yes (Comment) Care giver support system in place?: Yes (comment) Current home services: DME (walker) Criminal Activity/Legal Involvement Pertinent to Current Situation/Hospitalization: No - Comment as needed  Activities of Daily Living Home Assistive Devices/Equipment: Cane (specify quad or  straight) (straight) ADL Screening (condition at time of admission) Patient's cognitive ability adequate to safely complete daily activities?: Yes Is the patient deaf or have difficulty hearing?: Yes Does the patient have difficulty seeing, even when wearing glasses/contacts?: Yes Does the patient have difficulty concentrating, remembering, or making decisions?: No Patient able to express need for assistance with ADLs?: Yes Does the patient have difficulty dressing or bathing?: No Independently performs ADLs?: Yes (appropriate for developmental age) Does the patient have difficulty walking or climbing stairs?: No Weakness of Legs: None Weakness of Arms/Hands: Right  Permission Sought/Granted                  Emotional Assessment Appearance:: Appears stated age Attitude/Demeanor/Rapport: Engaged Affect (typically observed): Accepting Orientation: : Oriented to Self,Oriented to Place,Oriented to  Time,Oriented to Situation   Psych Involvement: No (comment)  Admission diagnosis:  Stroke [facility]) [I63.9] Cerebrovascular accident (CVA), unspecified mechanism (HCC) [I63.9] Patient Active Problem List   Diagnosis Date Noted  . Stroke (HCC) 02/22/2020  . Chest pain 03/24/2012  . Nausea 03/24/2012  . Diaphoresis 03/24/2012   PCP:  Garth Schlatter, MD Pharmacy:   [facility] [phone] - [place], [place] - [address] AT [facility] [address] [place] [address] Phone: [phone] Fax: [phone]     Social Determinants of Health (SDOH) Interventions    Readmission Risk Interventions No flowsheet data found.

## 2020-02-24 NOTE — Progress Notes (Signed)
   Subjective:   Patient reports feeling well this morning.  States that her right hand weakness has significantly improved and mostly just is weak in the right index finger.  However she does note that it is difficult to grip things.  She states she is not interested in home health PT.  She has had this before and is familiar with the exercises.  She denies any new symptoms or complaints at this time.  She states that she is ready to go home.  Objective:  Vital signs in last 24 hours: Vitals:   02/24/20 0007 02/24/20 0008 02/24/20 0313 02/24/20 0814  BP: (!) 167/134 (!) 160/80 (!) 144/72 138/80  Pulse: 71 75 80 84  Resp: 18 18 18 18   Temp: 97.8 F (36.6 C) 98.1 F (36.7 C) 98.3 F (36.8 C) (!) 97.4 F (36.3 C)  TempSrc: Oral Oral Oral Oral  SpO2: 97% 95% 97% 96%  Weight:      Height:       General- sitting at the bedside comfortably, no acute distress CV- normal rate and rhythm, no murmurs Lungs- clear to auscultation bilaterally normal work and effort of breathing Neuro- oriented x3, strength 5 out of 5 upper and lower extremities, sensation intact Skin-warm and dry  Assessment/Plan:  Active Problems:   Stroke [facility])  [name] is a 87 year old female with a past medical history of hypertension and PACs presenting with right upper extremity weakness and currently admitted for acute CVA.  Acute Left Peri-Rolandic Ischemic Infarct  No large vessel occlusion. On MRI there is evidence of moderate chronic microvascular ischemic changes though concerning for small vessel disease. Discussed with Dr. 83, states location of CVA concerning for embolic source. Plan is for a 30 day Holter monitor on discharge and follow up with Cardiology.  Lower extremity venous duplex ultrasound negative.  Lipid panel unremarkable. - Neurology on board, appreciate recommendations - Permissive hypertension, less than 220/110 - Cardiac monitoring - Aspirin 81 mg daily - Plavix 75 mg daily x 21  days   Hx of Hypertension Hx of Frequent PACs Home medications include metoprolol succinate 25 mg daily, however patient reports she does not take this medication.  Allowing permissive hypertension.  Continue to monitor. BP has been stable at this time.   Iron Deficiency Anemia  Suspect secondary to blood loss. She has a hx of recent FOBT + stool with GI follow up; colonoscopy/endoscopy on 10/15/2019, however results unavailable on Care Everywhere. Per patient, she was told she does not need either procedure again in the future. Hemoglobin at this time is improved compared to 4 months ago (from 7.8 to 10.2) -Continue iron supplementation   UTI Hx of Frequent Cystitis  Denies any dysuria, frequency or urgency.  Urine culture pending.  At this time no indication to start antibiotics.  Diet: Regular  VTE: Enoxaparin IVF: None,None Code: Full  Prior to Admission Living Arrangement: Home, living with son  Anticipated Discharge Location: Home Barriers to Discharge: Continued work up   Dispo: Anticipated discharge is later today.   10/17/2019, DO Internal Medicine PGY-2  Pager: [phone] After 5pm on weekdays and 1pm on weekends: On Call pager [phone]  02/24/2020, 12:05 PM

## 2020-02-24 NOTE — TOC Transition Note (Signed)
Transition of Care [facility]) - CM/SW Discharge Note   Patient Details  Name: Stephanie Foster MRN: [id number] Date of Birth: 1924/07/10  Transition of Care [facility]) CM/SW Contact:  Kermit Balo, RN Phone Number: 02/24/2020, 2:41 PM   Clinical Narrative:    Pt discharging home with son. Wheelchair for home delivered to the room.  Pt refusing HH services and MD updated. Son providing transport home.  Final next level of care: Home/Self Care Barriers to Discharge: No Barriers Identified   Patient Goals and CMS Choice   CMS Medicare.gov Compare Post Acute Care list provided to:: Patient Choice offered to / list presented to : Patient  Discharge Placement                       Discharge Plan and Services   Discharge Planning Services: CM Consult Post Acute Care Choice: Durable Medical Equipment          DME Arranged: Wheelchair manual DME Agency: [employer] Date DME Agency Contacted: 02/24/20   Representative spoke with at DME Agency: Delorise Jackson            Social Determinants of Health (SDOH) Interventions     Readmission Risk Interventions No flowsheet data found.

## 2020-02-24 NOTE — Plan of Care (Signed)

## 2020-02-24 NOTE — Plan of Care (Signed)

## 2020-02-25 ENCOUNTER — Encounter: Payer: Self-pay | Admitting: *Deleted

## 2020-02-25 LAB — URINE CULTURE: Culture: >=100000 — AB

## 2020-02-25 NOTE — Telephone Encounter (Signed)
30 day monitor ordered to be shipped to Pt.

## 2020-02-25 NOTE — Discharge Summary (Addendum)
Name: SHANAE LUO MRN: [id number] DOB: 1924/07/11 87 y.o. PCP: Garth Schlatter, MD  Date of Admission: 02/22/2020 11:47 AM Date of Discharge: 02/24/2020 Attending Physician: Reymundo Poll MD  Discharge Diagnosis: 1.  Stroke 2.  Iron Deficiency Anemia 3.  History of hypertension 4.  History of PACs 5. Asymptomatic Bacteriuria   Discharge Medications: Allergies as of 02/24/2020       Reactions   Ciprofloxacin Hives, Itching   Aspartame And Phenylalanine    "heart felt like swelling, top of head felt like blood pressure coming out of top"   Codeine Nausea Only   Diovan [valsartan]    "makes me weak"   Flagyl [metronidazole] Diarrhea, Nausea And Vomiting   Macrobid [nitrofurantoin Macrocrystal] Nausea Only   Phenylalanine Other (See Comments)   "heart felt like swelling, top of head felt like blood pressure coming out of top"   Prednisone    "makes me feel so weak that I can't walk across the floor"   Sulfa Antibiotics Nausea Only   Vitasana [actical]    "tired, arms and legs felt like going down"   Aspirin Palpitations        Medication List     STOP taking these medications    pantoprazole 40 MG tablet Commonly known as: PROTONIX       TAKE these medications    aspirin 81 MG EC tablet Take 1 tablet (81 mg total) by mouth daily. Swallow whole.   atorvastatin 20 MG tablet Commonly known as: LIPITOR Take 1 tablet (20 mg total) by mouth daily.   calcium carbonate 500 MG chewable tablet Commonly known as: TUMS - dosed in mg elemental calcium Chew 1-2 tablets by mouth as needed for indigestion or heartburn.   clopidogrel 75 MG tablet Commonly known as: PLAVIX Take 1 tablet (75 mg total) by mouth daily for 21 days.   iron polysaccharides 150 MG capsule Commonly known as: NIFEREX Take 1 capsule (150 mg total) by mouth daily.   metoprolol succinate 25 MG 24 hr tablet Commonly known as: TOPROL-XL Take 25 mg by mouth daily.   OVER THE COUNTER  MEDICATION Take 1 capsule by mouth 2 (two) times daily. Total Cardio Coverage  Purchase by mail        Disposition and follow-up:   Ms.Dayana POOJA CAMUSO was discharged from [facility] in Stable condition.  At the hospital follow up visit please address:  1.  Stroke- started on DAPT and statin therapy. She is also to be set up with cardiac monitoring and follow up with cardiology in [facility].  2.  Labs / imaging needed at time of follow-up: none  3.  Pending labs/ test needing follow-up: none  Follow-up Appointments:  Follow-up Information     [facility]. Schedule an appointment as soon as possible for a visit in 4 week(s).   Specialty: Neurology Contact information: [address] [place] [address] [phone]                Hospital Course by problem list: 1. Stroke-patient presented with right upper extremity weakness and found to have an acute CVA concerning for embolic etiology.  CT head was unremarkable.  MRI/MRA of the head and neck demonstrated small cortical/subcortical acute infarct in the left peririolandic region without evidence of hemorrhage.  No large vessel occlusion.  Echocardiogram showed an EF of 55% no cardiac source of emboli.  She was started on dual antiplatelet therapy to continue  Plavix 75 mg and aspirin 81 mg for 3 weeks followed with aspirin alone.  Started on atorvastatin 20 mg.  Embolic pattern source unclear however highly concerning for occult atrial fibrillation.  Patient will be set up with a 30-day cardiac event monitoring outpatient.  Also referred to [facility] heart care in [facility].  2. Iron Deficiency Anemia-Suspect secondary to blood loss. She has a hx of recent FOBT + stool with GI follow up; colonoscopy/endoscopy on 10/15/2019, however results unavailable on Care Everywhere. Per patient, she was told she does not need either procedure again in the future. Hemoglobin at this time is  improved compared to 4 months ago (from 7.8 to 10.2). Continue iron supplementation   3.  History of hypertension and frequent PACs- medications include metoprolol succinate 25 mg daily however patient reports she does not take this medication.  Please reassess follow-up if indicated.  BP remained stable during hospital course.  4. Asymptomatic Bacteriuria: History of frequent UTIs, previously followed with urology and was on prophylactic bactrim for some time. Urine culture this admission grew pan sensitive E Coli, however she was asymptomatic and did not receive treatment.   Discharge Vitals:   BP 134/76 (BP Location: Left Arm)   Pulse 80   Temp 98.3 F (36.8 C) (Oral)   Resp 18   Ht 5' (1.524 m)   Wt 66.2 kg   SpO2 95%   BMI 28.50 kg/m   Pertinent Labs, Studies, and Procedures:   CBC Latest Ref Rng & Units 02/23/2020 02/22/2020 02/22/2020  WBC 4.0 - 10.5 K/uL 4.9 - 5.6  Hemoglobin 12.0 - 15.0 g/dL 10.2(L) 12.6 11.1(L)  Hematocrit 36.0 - 46.0 % 33.1(L) 37.0 38.5  Platelets 150 - 400 K/uL 172 - 183   BMP Latest Ref Rng & Units 02/23/2020 02/22/2020 02/22/2020  Glucose 70 - 99 mg/dL 97 540(J) 811(B)  BUN 8 - 23 mg/dL 20 14(N) 82(N)  Creatinine 0.44 - 1.00 mg/dL 5.62 1.30 8.65(H)  Sodium 135 - 145 mmol/L 143 144 142  Potassium 3.5 - 5.1 mmol/L 4.1 3.8 3.9  Chloride 98 - 111 mmol/L 110 108 107  CO2 22 - 32 mmol/L 25 - 23  Calcium 8.9 - 10.3 mg/dL 8.4(O) - 8.6(L)   Lipid Panel     Component Value Date/Time   CHOL 179 02/24/2020 0154   TRIG 51 02/24/2020 0154   HDL 70 02/24/2020 0154   CHOLHDL 2.6 02/24/2020 0154   VLDL 10 02/24/2020 0154   LDLCALC 99 02/24/2020 0154   MR ANGIO HEAD WO CONTRAST  Result Date: 02/22/2020 CLINICAL DATA:  Right upper extremity weakness EXAM: MRI HEAD WITHOUT CONTRAST MRA HEAD WITHOUT CONTRAST MRA NECK WITHOUT AND WITH CONTRAST TECHNIQUE: Multiplanar, multiecho pulse sequences of the brain and surrounding structures were obtained without intravenous  contrast. Angiographic images of the Circle of Willis were obtained using MRA technique without intravenous contrast. Angiographic images of the neck were obtained using MRA technique without and with intravenous contrast. Carotid stenosis measurements (when applicable) are obtained utilizing NASCET criteria, using the distal internal carotid diameter as the denominator. CONTRAST:  6.45mL GADAVIST GADOBUTROL 1 MMOL/ML IV SOLN COMPARISON:  None. FINDINGS: MRI HEAD Brain: Cortical/subcortical mildly reduced diffusion in the left perirolandic region. No evidence of intracranial hemorrhage. Extra-axial mass along the parasagittal left frontal convexity at the vertex measuring 2 x 1.5 cm coronally consistent with a meningioma. This is in proximity to the superior sagittal sinus without significant compression or invasion. Patchy and confluent areas of T2 hyperintensity  in the supratentorial white matter are nonspecific but probably reflect moderate chronic microvascular ischemic changes. Prominence of the ventricles and sulci reflects mild generalized parenchymal volume loss. No significant mass effect. There is no hydrocephalus or extra-axial fluid collection. Vascular: Major vessel flow voids at the skull base are preserved. Skull and upper cervical spine: Normal marrow signal is preserved. Degenerative changes of the cervical spine. Sinuses/Orbits: Paranasal sinuses are aerated. Orbits are unremarkable. Other: Sella is unremarkable.  Mastoid air cells are clear. MRA HEAD Intracranial internal carotid arteries are patent. Middle and anterior cerebral arteries are patent. Intracranial vertebral arteries, basilar artery, posterior cerebral arteries are patent. Left posterior communicating artery is present. There is no significant stenosis. There is a 1-2 mm inferiorly acted outpouching from the distal supraclinoid right ICA near the expected location of a posterior communicating artery. MRA NECK Common, internal, and  external carotid arteries are patent. There is no measurable stenosis at the ICA origins. Codominant extracranial vertebral arteries are patent without measurable stenosis or evidence of dissection. IMPRESSION: Small cortical/subcortical acute infarct in the left perirolandic region. No evidence of hemorrhage. Small left frontal convexity meningioma without parenchymal edema. Moderate chronic microvascular ischemic changes. No large vessel occlusion or hemodynamically significant stenosis. 1-2 mm aneurysm or infundibulum of the distal supraclinoid right ICA. Electronically Signed   By: Guadlupe Spanish M.D.   On: 02/22/2020 17:53   MR ANGIO NECK W WO CONTRAST  Result Date: 02/22/2020 CLINICAL DATA:  Right upper extremity weakness EXAM: MRI HEAD WITHOUT CONTRAST MRA HEAD WITHOUT CONTRAST MRA NECK WITHOUT AND WITH CONTRAST TECHNIQUE: Multiplanar, multiecho pulse sequences of the brain and surrounding structures were obtained without intravenous contrast. Angiographic images of the Circle of Willis were obtained using MRA technique without intravenous contrast. Angiographic images of the neck were obtained using MRA technique without and with intravenous contrast. Carotid stenosis measurements (when applicable) are obtained utilizing NASCET criteria, using the distal internal carotid diameter as the denominator. CONTRAST:  6.46mL GADAVIST GADOBUTROL 1 MMOL/ML IV SOLN COMPARISON:  None. FINDINGS: MRI HEAD Brain: Cortical/subcortical mildly reduced diffusion in the left perirolandic region. No evidence of intracranial hemorrhage. Extra-axial mass along the parasagittal left frontal convexity at the vertex measuring 2 x 1.5 cm coronally consistent with a meningioma. This is in proximity to the superior sagittal sinus without significant compression or invasion. Patchy and confluent areas of T2 hyperintensity in the supratentorial white matter are nonspecific but probably reflect moderate chronic microvascular ischemic  changes. Prominence of the ventricles and sulci reflects mild generalized parenchymal volume loss. No significant mass effect. There is no hydrocephalus or extra-axial fluid collection. Vascular: Major vessel flow voids at the skull base are preserved. Skull and upper cervical spine: Normal marrow signal is preserved. Degenerative changes of the cervical spine. Sinuses/Orbits: Paranasal sinuses are aerated. Orbits are unremarkable. Other: Sella is unremarkable.  Mastoid air cells are clear. MRA HEAD Intracranial internal carotid arteries are patent. Middle and anterior cerebral arteries are patent. Intracranial vertebral arteries, basilar artery, posterior cerebral arteries are patent. Left posterior communicating artery is present. There is no significant stenosis. There is a 1-2 mm inferiorly acted outpouching from the distal supraclinoid right ICA near the expected location of a posterior communicating artery. MRA NECK Common, internal, and external carotid arteries are patent. There is no measurable stenosis at the ICA origins. Codominant extracranial vertebral arteries are patent without measurable stenosis or evidence of dissection. IMPRESSION: Small cortical/subcortical acute infarct in the left perirolandic region. No evidence of hemorrhage. Small left frontal  convexity meningioma without parenchymal edema. Moderate chronic microvascular ischemic changes. No large vessel occlusion or hemodynamically significant stenosis. 1-2 mm aneurysm or infundibulum of the distal supraclinoid right ICA. Electronically Signed   By: Guadlupe SpanishPraneil  Patel M.D.   On: 02/22/2020 17:53   MR BRAIN WO CONTRAST  Result Date: 02/22/2020 CLINICAL DATA:  Right upper extremity weakness EXAM: MRI HEAD WITHOUT CONTRAST MRA HEAD WITHOUT CONTRAST MRA NECK WITHOUT AND WITH CONTRAST TECHNIQUE: Multiplanar, multiecho pulse sequences of the brain and surrounding structures were obtained without intravenous contrast. Angiographic images of the  Circle of Willis were obtained using MRA technique without intravenous contrast. Angiographic images of the neck were obtained using MRA technique without and with intravenous contrast. Carotid stenosis measurements (when applicable) are obtained utilizing NASCET criteria, using the distal internal carotid diameter as the denominator. CONTRAST:  6.805mL GADAVIST GADOBUTROL 1 MMOL/ML IV SOLN COMPARISON:  None. FINDINGS: MRI HEAD Brain: Cortical/subcortical mildly reduced diffusion in the left perirolandic region. No evidence of intracranial hemorrhage. Extra-axial mass along the parasagittal left frontal convexity at the vertex measuring 2 x 1.5 cm coronally consistent with a meningioma. This is in proximity to the superior sagittal sinus without significant compression or invasion. Patchy and confluent areas of T2 hyperintensity in the supratentorial white matter are nonspecific but probably reflect moderate chronic microvascular ischemic changes. Prominence of the ventricles and sulci reflects mild generalized parenchymal volume loss. No significant mass effect. There is no hydrocephalus or extra-axial fluid collection. Vascular: Major vessel flow voids at the skull base are preserved. Skull and upper cervical spine: Normal marrow signal is preserved. Degenerative changes of the cervical spine. Sinuses/Orbits: Paranasal sinuses are aerated. Orbits are unremarkable. Other: Sella is unremarkable.  Mastoid air cells are clear. MRA HEAD Intracranial internal carotid arteries are patent. Middle and anterior cerebral arteries are patent. Intracranial vertebral arteries, basilar artery, posterior cerebral arteries are patent. Left posterior communicating artery is present. There is no significant stenosis. There is a 1-2 mm inferiorly acted outpouching from the distal supraclinoid right ICA near the expected location of a posterior communicating artery. MRA NECK Common, internal, and external carotid arteries are patent.  There is no measurable stenosis at the ICA origins. Codominant extracranial vertebral arteries are patent without measurable stenosis or evidence of dissection. IMPRESSION: Small cortical/subcortical acute infarct in the left perirolandic region. No evidence of hemorrhage. Small left frontal convexity meningioma without parenchymal edema. Moderate chronic microvascular ischemic changes. No large vessel occlusion or hemodynamically significant stenosis. 1-2 mm aneurysm or infundibulum of the distal supraclinoid right ICA. Electronically Signed   By: Guadlupe SpanishPraneil  Patel M.D.   On: 02/22/2020 17:53   ECHOCARDIOGRAM COMPLETE  Result Date: 02/22/2020    ECHOCARDIOGRAM REPORT   Patient Name:   Lynnae PrudeRUTH H Flatley Date of Exam: 02/22/2020 Medical Rec #:  [id number]    Height:       60.0 in Accession #:    [id number]   Weight:       145.9 lb Date of Birth:  1924-09-23    BSA:          1.633 m Patient Age:    89 years     BP:           136/66 mmHg Patient Gender: F            HR:           79 bpm. Exam Location:  Inpatient Procedure: 2D Echo, Color Doppler and Cardiac Doppler Indications:     Stroke i163.9  History:         Patient has no prior history of Echocardiogram examinations.                  Risk Factors:Hypertension.  Sonographer:     Irving Burton Senior RDCS Referring Phys:  [id number] Benjiman Core Diagnosing Phys: Marca Ancona MD IMPRESSIONS  1. Left ventricular ejection fraction, by estimation, is 55%. The left ventricle has normal function. The left ventricle has no regional wall motion abnormalities. Left ventricular diastolic parameters are consistent with Grade I diastolic dysfunction (impaired relaxation).  2. Right ventricular systolic function is normal. The right ventricular size is normal. There is normal pulmonary artery systolic pressure. The estimated right ventricular systolic pressure is 26.7 mmHg.  3. Left atrial size was moderately dilated.  4. Right atrial size was mildly dilated.  5. The mitral valve is normal in  structure. Mild mitral valve regurgitation. No evidence of mitral stenosis.  6. The aortic valve is tricuspid. Aortic valve regurgitation is not visualized. No aortic stenosis is present.  7. The inferior vena cava is normal in size with <50% respiratory variability, suggesting right atrial pressure of 8 mmHg. FINDINGS  Left Ventricle: Left ventricular ejection fraction, by estimation, is 55%. The left ventricle has normal function. The left ventricle has no regional wall motion abnormalities. The left ventricular internal cavity size was normal in size. There is no left ventricular hypertrophy. Left ventricular diastolic parameters are consistent with Grade I diastolic dysfunction (impaired relaxation). Right Ventricle: The right ventricular size is normal. No increase in right ventricular wall thickness. Right ventricular systolic function is normal. There is normal pulmonary artery systolic pressure. The tricuspid regurgitant velocity is 2.16 m/s, and  with an assumed right atrial pressure of 8 mmHg, the estimated right ventricular systolic pressure is 26.7 mmHg. Left Atrium: Left atrial size was moderately dilated. Right Atrium: Right atrial size was mildly dilated. Pericardium: There is no evidence of pericardial effusion. Mitral Valve: The mitral valve is normal in structure. Mild mitral annular calcification. Mild mitral valve regurgitation. No evidence of mitral valve stenosis. Tricuspid Valve: The tricuspid valve is normal in structure. Tricuspid valve regurgitation is trivial. Aortic Valve: The aortic valve is tricuspid. Aortic valve regurgitation is not visualized. No aortic stenosis is present. Pulmonic Valve: The pulmonic valve was normal in structure. Pulmonic valve regurgitation is not visualized. Aorta: The aortic root is normal in size and structure. Venous: The inferior vena cava is normal in size with less than 50% respiratory variability, suggesting right atrial pressure of 8 mmHg. IAS/Shunts: No  atrial level shunt detected by color flow Doppler.  LEFT VENTRICLE PLAX 2D LVIDd:         3.80 cm  Diastology LVIDs:         2.30 cm  LV e' medial:    7.62 cm/s LV PW:         1.00 cm  LV E/e' medial:  10.5 LV IVS:        1.00 cm  LV e' lateral:   10.70 cm/s LVOT diam:     1.80 cm  LV E/e' lateral: 7.5 LV SV:         50 LV SV Index:   31 LVOT Area:     2.54 cm  RIGHT VENTRICLE RV S prime:     12.40 cm/s TAPSE (M-mode): 1.9 cm LEFT ATRIUM             Index       RIGHT ATRIUM  Index LA diam:        2.90 cm 1.78 cm/m  RA Area:     18.30 cm LA Vol (A2C):   64.3 ml 39.38 ml/m RA Volume:   40.80 ml  24.99 ml/m LA Vol (A4C):   79.8 ml 48.88 ml/m LA Biplane Vol: 74.5 ml 45.63 ml/m  AORTIC VALVE LVOT Vmax:   79.00 cm/s LVOT Vmean:  59.500 cm/s LVOT VTI:    0.197 m  AORTA Ao Root diam: 3.30 cm MITRAL VALVE               TRICUSPID VALVE MV Area (PHT): 3.48 cm    TR Peak grad:   18.7 mmHg MV Decel Time: 218 msec    TR Vmax:        216.00 cm/s MV E velocity: 80.10 cm/s MV A velocity: 88.70 cm/s  SHUNTS MV E/A ratio:  0.90        Systemic VTI:  0.20 m                            Systemic Diam: 1.80 cm Marca Anconaalton Mclean MD Electronically signed by Marca Anconaalton Mclean MD Signature Date/Time: 02/22/2020/5:33:08 PM    Final (Updated)    CT HEAD CODE STROKE WO CONTRAST  Result Date: 02/22/2020 CLINICAL DATA:  Code stroke.  Neuro deficit, acute, stroke suspected EXAM: CT HEAD WITHOUT CONTRAST TECHNIQUE: Contiguous axial images were obtained from the base of the skull through the vertex without intravenous contrast. COMPARISON:  01/17/2009 report and prior. FINDINGS: Brain: No intracranial hemorrhage. No acute infarct. Calcified left frontal meningioma at the vertex. 0.8 cm hyperdense mass overlying the right cerebellum. No midline shift, ventriculomegaly or extra-axial fluid collection. Scattered and confluent supratentorial white matter hypodense foci are nonspecific however commonly associated with chronic microvascular  ischemic changes. Mild cerebral atrophy with ex vacuo dilatation. Vascular: No hyperdense vessel or unexpected calcification. Bilateral skull base atherosclerotic calcifications. Skull: No acute finding. Sinuses/Orbits: No acute finding. Other: None. ASPECTS [facility]([place] Stroke Program Early CT Score) - Ganglionic level infarction (caudate, lentiform nuclei, internal capsule, insula, M1-M3 cortex): 7 - Supraganglionic infarction (M4-M6 cortex): 3 Total score (0-10 with 10 being normal): 10 IMPRESSION: 1. No acute intracranial process. 2. ASPECTS is 10. 3. Mild cerebral atrophy and chronic microvascular ischemic changes. 4. Calcified left frontal meningioma. Subcentimeter right posterior cranial fossa mass may reflect a smaller meningioma. Consider MRI for further evaluation. Code stroke imaging results were communicated on 02/22/2020 at 12:03 pm to provider Dr. Rubin PayorPickering Via telephone, who verbally acknowledged these results. Electronically Signed   By: Stana Buntinghikanele  Emekauwa M.D.   On: 02/22/2020 12:16   VAS US LOWER EXTREMITY VENOUS (DVT)  Result Date: 02/23/2020  Lower Venous DVT Study Indications: Stroke.  Comparison Study: No prior study Performing Technologist: Sherren Kernsandace Kanady RVS  Examination Guidelines: A complete evaluation includes B-mode imaging, spectral Doppler, color Doppler, and power Doppler as needed of all accessible portions of each vessel. Bilateral testing is considered an integral part of a complete examination. Limited examinations for reoccurring indications may be performed as noted. The reflux portion of the exam is performed with the patient in reverse Trendelenburg.  +---------+---------------+---------+-----------+----------+--------------+ RIGHT    CompressibilityPhasicitySpontaneityPropertiesThrombus Aging +---------+---------------+---------+-----------+----------+--------------+ CFV      Full           Yes      Yes                                  +---------+---------------+---------+-----------+----------+--------------+  SFJ      Full                                                        +---------+---------------+---------+-----------+----------+--------------+ FV Prox  Full                                                        +---------+---------------+---------+-----------+----------+--------------+ FV Mid   Full                                                        +---------+---------------+---------+-----------+----------+--------------+ FV DistalFull                                                        +---------+---------------+---------+-----------+----------+--------------+ PFV      Full                                                        +---------+---------------+---------+-----------+----------+--------------+ POP      Full           Yes      Yes                                 +---------+---------------+---------+-----------+----------+--------------+ PTV      Full                                                        +---------+---------------+---------+-----------+----------+--------------+ PERO     Full                                                        +---------+---------------+---------+-----------+----------+--------------+   +---------+---------------+---------+-----------+----------+--------------+ LEFT     CompressibilityPhasicitySpontaneityPropertiesThrombus Aging +---------+---------------+---------+-----------+----------+--------------+ CFV      Full           Yes      Yes                                 +---------+---------------+---------+-----------+----------+--------------+ SFJ      Full                                                        +---------+---------------+---------+-----------+----------+--------------+  FV Prox  Full                                                         +---------+---------------+---------+-----------+----------+--------------+ FV Mid   Full                                                        +---------+---------------+---------+-----------+----------+--------------+ FV DistalFull                                                        +---------+---------------+---------+-----------+----------+--------------+ PFV      Full                                                        +---------+---------------+---------+-----------+----------+--------------+ POP      Full           Yes      Yes                                 +---------+---------------+---------+-----------+----------+--------------+ PTV      Full                                                        +---------+---------------+---------+-----------+----------+--------------+ PERO     Full                                                        +---------+---------------+---------+-----------+----------+--------------+     Summary: RIGHT: - There is no evidence of deep vein thrombosis in the lower extremity.  LEFT: - There is no evidence of deep vein thrombosis in the lower extremity.  - A cystic structure is found in the popliteal fossa.  *See table(s) above for measurements and observations. Electronically signed by Sherald Hess MD on 02/23/2020 at 4:47:35 PM.    Final     Discharge Instructions: Discharge Instructions     Ambulatory referral to Cardiology   Complete by: As directed    Ambulatory referral to Neurology   Complete by: As directed    Follow up with stroke clinic NP (Jessica Vanschaick or Darrol Angel, if both not available, consider Manson Allan, or Ahern) at [facility] in about 4 weeks. Thanks.   Diet - low sodium heart healthy   Complete by: As directed    Discharge instructions   Complete by: As directed    Ms. Meleena, Munroe were hospitalized due to a stroke.  You were started  on aspirin and Plavix.  Please note the following  changes to your medications:  START aspirin 81 mg daily START Plavix 75 mg daily for 3 weeks START Lipitor 20 mg daily START iron supplement daily  Please follow-up with your primary care physician in 1 to 2 weeks.  I have also sent in a referral for a cardiologist and to have a 30 day heart monitor set up. The heart monitor will be mailed to your house with specific instructions on how to set it up.   I also want you to continue doing the occupational therapy exercises you are familiar with to help maintain and regain any strength deficits in her right hand.  Thank you for allowing [place] to be part of your care! We wish you the best!   Increase activity slowly   Complete by: As directed        Signed: Mayfield Schoene N, DO 02/25/2020, 8:14 AM

## 2020-02-25 NOTE — Progress Notes (Signed)
Patient ID: Stephanie Foster, female   DOB: 1924/12/07, 87 y.o.   MRN: [id number] Patient enrolled for [employer] to ship a 30 day cardiac event monitor to her home.  Letter with instructions mailed to patient.

## 2020-02-25 NOTE — Addendum Note (Signed)
Addended by: Roney Mans A on: 02/25/2020 08:45 AM   Modules accepted: Orders

## 2020-02-28 ENCOUNTER — Encounter (INDEPENDENT_AMBULATORY_CARE_PROVIDER_SITE_OTHER): Payer: Medicare Other

## 2020-02-28 ENCOUNTER — Other Ambulatory Visit: Payer: Self-pay

## 2020-02-28 ENCOUNTER — Encounter: Payer: Self-pay | Admitting: Cardiology

## 2020-02-28 ENCOUNTER — Ambulatory Visit (INDEPENDENT_AMBULATORY_CARE_PROVIDER_SITE_OTHER): Payer: Medicare Other | Admitting: Cardiology

## 2020-02-28 DIAGNOSIS — I63412 Cerebral infarction due to embolism of left middle cerebral artery: Secondary | ICD-10-CM | POA: Diagnosis not present

## 2020-02-28 DIAGNOSIS — I6930 Unspecified sequelae of cerebral infarction: Secondary | ICD-10-CM | POA: Insufficient documentation

## 2020-02-28 DIAGNOSIS — R0600 Dyspnea, unspecified: Secondary | ICD-10-CM

## 2020-02-28 DIAGNOSIS — E785 Hyperlipidemia, unspecified: Secondary | ICD-10-CM | POA: Insufficient documentation

## 2020-02-28 DIAGNOSIS — R0609 Other forms of dyspnea: Secondary | ICD-10-CM | POA: Insufficient documentation

## 2020-02-28 DIAGNOSIS — I10 Essential (primary) hypertension: Secondary | ICD-10-CM

## 2020-02-28 DIAGNOSIS — I4891 Unspecified atrial fibrillation: Secondary | ICD-10-CM | POA: Diagnosis not present

## 2020-02-28 HISTORY — DX: Dyspnea, unspecified: R06.00

## 2020-02-28 HISTORY — DX: Unspecified sequelae of cerebral infarction: I69.30

## 2020-02-28 HISTORY — DX: Other forms of dyspnea: R06.09

## 2020-02-28 HISTORY — DX: Essential (primary) hypertension: I10

## 2020-02-28 HISTORY — DX: Hyperlipidemia, unspecified: E78.5

## 2020-02-28 NOTE — Patient Instructions (Signed)

## 2020-02-28 NOTE — Progress Notes (Signed)
Cardiology Consultation:    Date:  02/28/2020   ID:  AOKI WEDEMEYER, DOB 1924/09/30, MRN [id number]  PCP:  Shellia Cleverly, PA  Cardiologist:  Gypsy Balsam, MD   Referring MD: Reymundo Poll, MD   Chief Complaint  Patient presents with  . Cerebrovascular Accident    History of Present Illness:    Stephanie Foster is a 87 y.o. female who is being seen today for the evaluation of CVA, rule out atrial fibrillation at the request of Reymundo Poll, MD.  She is a very sweet and sharp lady who recently was admitted to the hospital because of right upper extremity weakness.  She was find to have acute stroke ischemic no hemorrhagic no large vessel occlusion was identified.  Echocardiogram was done at that time showed preserved ejection fraction and large left atrium she was discharged home with dual antiplatelets therapy in form of Plavix and aspirin for 3 weeks and then after that she will continue with aspirin alone.  She was also started with atorvastatin.  She recovered quite nicely still complain of having some difficulty with right hand but overall she is doing quite amazingly well.  Denies have any chest pain tightness squeezing pressure burning chest still very busy at home she is actually come to our office with her son and son telling me that she can blow leaves outside she can move furniture in her house.  She is still very busy and doing quite well.  Looks like she recovered completely from her CVA.  Past Medical History:  Diagnosis Date  . Cataract   . Diverticulosis   . GERD (gastroesophageal reflux disease)   . H/O hiatal hernia   . Hernia   . Hypertension     Past Surgical History:  Procedure Laterality Date  . ABDOMINAL HYSTERECTOMY    . BLADDER SUSPENSION    . EYE SURGERY    . TONSILLECTOMY      Current Medications: Current Meds  Medication Sig  . aspirin EC 81 MG EC tablet Take 1 tablet (81 mg total) by mouth daily. Swallow whole.  Marland Kitchen atorvastatin (LIPITOR) 20  MG tablet Take 1 tablet (20 mg total) by mouth daily.  . AZO-CRANBERRY PO Take by mouth.  . clopidogrel (PLAVIX) 75 MG tablet Take 1 tablet (75 mg total) by mouth daily for 21 days.  Marland Kitchen CRANBERRY CONCENTRATE PO Take by mouth.  . docusate sodium (COLACE) 50 MG capsule Take 2 capsules by mouth 2 (two) times daily as needed.  . iron polysaccharides (NIFEREX) 150 MG capsule Take 1 capsule (150 mg total) by mouth daily.  . metoprolol succinate (TOPROL-XL) 25 MG 24 hr tablet Take 25 mg by mouth daily.     Allergies:   Ciprofloxacin, Aspartame and phenylalanine, Codeine, Diovan [valsartan], Flagyl [metronidazole], Macrobid [nitrofurantoin macrocrystal], Phenylalanine, Prednisone, Sulfa antibiotics, Vitasana [actical], and Aspirin   Social History   Socioeconomic History  . Marital status: Widowed    Spouse name: Not on file  . Number of children: Not on file  . Years of education: Not on file  . Highest education level: Not on file  Occupational History  . Not on file  Tobacco Use  . Smoking status: Never Smoker  . Smokeless tobacco: Never Used  Substance and Sexual Activity  . Alcohol use: No  . Drug use: No  . Sexual activity: Not on file  Other Topics Concern  . Not on file  Social History Narrative  . Not on file  Social Determinants of Health   Financial Resource Strain: Not on file  Food Insecurity: Not on file  Transportation Needs: Not on file  Physical Activity: Not on file  Stress: Not on file  Social Connections: Not on file     Family History: The patient's family history includes Coronary artery disease in her brother and father. ROS:   Please see the history of present illness.    All 14 point review of systems negative except as described per history of present illness.  EKGs/Labs/Other Studies Reviewed:    The following studies were reviewed today: I did review record from hospitalization  EKG:  EKG is  ordered today.  The ekg ordered today demonstrates  normal sinus rhythm, PVCs normal PR interval, normal QS complex duration morphology cannot rule out anterior infarct, nonspecific ST segment changes  Recent Labs: 02/23/2020: ALT 10; BUN 20; Creatinine, Ser 0.92; Hemoglobin 10.2; Platelets 172; Potassium 4.1; Sodium 143; TSH 5.572  Recent Lipid Panel    Component Value Date/Time   CHOL 179 02/24/2020 0154   TRIG 51 02/24/2020 0154   HDL 70 02/24/2020 0154   CHOLHDL 2.6 02/24/2020 0154   VLDL 10 02/24/2020 0154   LDLCALC 99 02/24/2020 0154    Physical Exam:    VS:  BP (!) 142/80 (BP Location: Right Arm, Patient Position: Sitting)   Pulse 67   Ht 5' (1.524 m)   Wt 139 lb (63 kg)   SpO2 92%   BMI 27.15 kg/m     Wt Readings from Last 3 Encounters:  02/28/20 139 lb (63 kg)  02/22/20 145 lb 15.1 oz (66.2 kg)  12/14/15 142 lb (64.4 kg)     GEN:  Well nourished, well developed in no acute distress HEENT: Normal NECK: No JVD; No carotid bruits LYMPHATICS: No lymphadenopathy CARDIAC: RRR, no murmurs, no rubs, no gallops RESPIRATORY:  Clear to auscultation without rales, wheezing or rhonchi  ABDOMEN: Soft, non-tender, non-distended MUSCULOSKELETAL:  No edema; No deformity  SKIN: Warm and dry NEUROLOGIC:  Alert and oriented x 3 PSYCHIATRIC:  Normal affect   ASSESSMENT:    1. Late effect of cerebrovascular accident (CVA)   2. Dyslipidemia   3. Dyspnea on exertion   4. Essential hypertension    PLAN:    In order of problems listed above:  1. Late effect of CVA.  She recovered quite amazingly.  She is on dual antiplatelet therapy as well as statin will continue.  She is already scheduled to have a monitor for 30 days to see if she have any evidence of atrial fibrillation.  We do have suspicion for this and she does have enlargement of the left atrium.  I will not alter any of her medication right now if try today's event recorder will be unrevealing we may consider implantable loop recorder. 2. Dyslipidemia she does have LDL of  70 and LDL of 99.  Initiated with Lipitor. 3. Dyspnea on exertion echocardiogram preserved ejection fraction overall after me she is doing quite well. 4. Essential hypertension blood pressure well controlled.   Medication Adjustments/Labs and Tests Ordered: Current medicines are reviewed at length with the patient today.  Concerns regarding medicines are outlined above.  No orders of the defined types were placed in this encounter.  No orders of the defined types were placed in this encounter.   Signed, [name], MD, [facility]. 02/28/2020 11:16 AM    [facility]

## 2020-03-14 ENCOUNTER — Other Ambulatory Visit: Payer: Self-pay | Admitting: Internal Medicine

## 2020-03-22 ENCOUNTER — Encounter: Payer: Self-pay | Admitting: Internal Medicine

## 2020-03-22 NOTE — Progress Notes (Unsigned)
New onset atrial flutter caught by preventive monitoring. Patient unable to be reached. She will need to be started on anticoagulation.

## 2020-03-23 ENCOUNTER — Other Ambulatory Visit: Payer: Self-pay | Admitting: Internal Medicine

## 2020-03-24 ENCOUNTER — Telehealth: Payer: Self-pay | Admitting: *Deleted

## 2020-03-24 ENCOUNTER — Other Ambulatory Visit: Payer: Self-pay | Admitting: Internal Medicine

## 2020-03-24 MED ORDER — APIXABAN 5 MG PO TABS: 5.0000 mg | ORAL_TABLET | Freq: Two times a day (BID) | ORAL | 6 refills | Status: DC

## 2020-03-24 NOTE — Telephone Encounter (Signed)
Received 2 events from [employer] demonstrating At Fib with rates from 80 to 160 bpm.  Monitor was ordered s/p recent hospitalization for CVA - to r/o At Fib.  Pt of Dr Bing Matter on CME this week.  Reviewed by Dr Excell Seltzer who confirms AF with RVR in pt with recent ischemic stroke.  Orders given to d/c ASA, start Apixaban 5 mg BID and refer to At Fib clinic of f/u with Dr Bing Matter when he returns.   Called phone # listed in pt's chart as her home #.  Pt's son Stephanie Foster answer and it is his cell number. Stephanie Foster reports he takes all calls for pt.  Advised him of pt's recent monitor results from 2/5 at 7:59 pm and 2/6 at 6:19 am.  He reports he was called and told her patch was loose on Saturday evening and he replaced it Sunday AM around 5 am.  Advised him monitor very clearly demonstrates At Fib Sunday AM.  Reviewed Dr Earmon Phoenix orders to d/c ASA, start Apixaban 5 mg BID and refer to At Fib clinic or see Dr Bing Matter on his return.  Stephanie Foster reports pt does not want to take the medications she is already having to take.  Advised of the importance of taking this medication for further stroke prevention.  Attempted to schedule appt for pt in the At [facility] however Stephanie Foster refused stating it is too far to drive.  He reports he prefers she be scheduled with Dr Bing Matter.  He is aware he is not back in the [facility] office for several weeks.  He reports he is driving at this time and will need to be called back for her to be scheduled.  Son requests RX be sent into [facility].  This was sent electronically as requested.

## 2020-03-24 NOTE — Telephone Encounter (Signed)
Attempted to call back no answer and no voicemail.

## 2020-03-24 NOTE — Telephone Encounter (Signed)
Spoke to patient and got verbal permission from her to speak to son. Scheduled appointment with her son for her to be seen. No further questions.

## 2020-03-24 NOTE — Telephone Encounter (Signed)
Called spoke to patient son. He is not on dpr and not with patient for me to get verbal permission to talk with him. Patient son will be back with patient after 2 and I will call back then to get permission from her to speak with him. Informed patient son if next time they are in the office if they will make sure to fill out the dpr form we will be able to speak with whoever they put on the form. He understood no further questions.

## 2020-03-25 ENCOUNTER — Ambulatory Visit (INDEPENDENT_AMBULATORY_CARE_PROVIDER_SITE_OTHER): Payer: Medicare Other | Admitting: Adult Health

## 2020-03-25 ENCOUNTER — Other Ambulatory Visit: Payer: Self-pay

## 2020-03-25 ENCOUNTER — Telehealth: Payer: Self-pay | Admitting: *Deleted

## 2020-03-25 ENCOUNTER — Encounter: Payer: Self-pay | Admitting: Adult Health

## 2020-03-25 VITALS — BP 137/75 | HR 70 | Ht 60.0 in | Wt 141.6 lb

## 2020-03-25 DIAGNOSIS — Z9189 Other specified personal risk factors, not elsewhere classified: Secondary | ICD-10-CM | POA: Diagnosis not present

## 2020-03-25 DIAGNOSIS — I480 Paroxysmal atrial fibrillation: Secondary | ICD-10-CM

## 2020-03-25 DIAGNOSIS — I639 Cerebral infarction, unspecified: Secondary | ICD-10-CM | POA: Diagnosis not present

## 2020-03-25 NOTE — Telephone Encounter (Signed)
[employer] cardiac report. 03/24/2020  02:51 pm Auto trigger Atrial Flutter with variable conduction and MF PVC's 4 in 1 minute.   Patient is feeling okay today just weak. No chance during 2:51 pm event. Confirmed appointment in place and started Apixaban 03/24/20.   Will take to [facility] for interpretation. Signed, continue to monitor, keep planned appointment and do not miss any doses of Apixaban. Placed to be scanned into epic.

## 2020-03-25 NOTE — Progress Notes (Signed)
I agree with the above plan 

## 2020-03-25 NOTE — Progress Notes (Signed)
[facility] [address] [place]. [place] [address] [phone]       HOSPITAL FOLLOW UP NOTE  [name] Date of Birth:  28-Dec-1924 Medical Record Number:  [id number]   Reason for Referral:  hospital stroke follow up    SUBJECTIVE:   CHIEF COMPLAINT:  Chief Complaint  Patient presents with  . Follow-up    Pt here with son, Stephanie Foster. She is currently wearing a heart monitor. They would like to be referred to a neurologist closer to home in [facility]     HPI:   [name] is a 87 y.o. female with history of  PACs on BB, HTN, hiatal hernia, and macular degeneration (vision in decreased). Pt presented to [facility] ED on 02/22/2020 from home (lives with son) after sudden onset of RUE/hand weakness.   Personally reviewed hospitalization pertinent progress notes, lab work and imaging with summary provided.  Evaluated by Dr. Roda Shutters with stroke work-up revealing small cortical acute infarct in the left perirolandic region, embolic pattern secondary to unknown source although high suspicion for AF history of palpitations and PACs.  Recommended 30-day cardiac event monitor outpatient to further assess for A. fib.  Recommended DAPT for 3 weeks and aspirin alone.  History of HTN stable on metoprolol.  LDL 99 and initiated atorvastatin 20 mg daily.  No evidence or history of A1c with DM 5.2.  Other stroke risk factors include advanced age but no prior stroke history.  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  Residual mild RUE weakness.  Evaluated by therapies and discharged home in stable condition with recommended HH OT.   Stroke: small cortical acute infarct in the left perirolandic region, embolic pattern, source unclear, highly concerning for occult AF  CT Head - No acute intracranial process.  ASPECTS is 10. Mild cerebral atrophy and chronic microvascular ischemic changes.    MRI head - Small cortical acute  infarct in the left perirolandic region. No evidence of hemorrhage. Small left frontal convexity meningioma without parenchymal edema.   MRA Head and Neck - No large vessel occlusion or hemodynamically significant stenosis. 1-2 mm aneurysm or infundibulum of the distal supraclinoid right ICA.   2D Echo - EF 55%. No cardiac source of emboli identified.   LE venous doppler no DVT  Recommend 30 day cardiac event monitoring as outpt  [employer] Virus 2 - negative  LDL 99  HgbA1c - 5.2  VTE prophylaxis - Lovenox  No antithrombotic prior to admission, now on aspirin 81 mg daily and clopidogrel 75 mg daily. Continue DAPT for 3 weeks and then ASA alone.  Patient will be counseled to be compliant with her antithrombotic medications  Ongoing aggressive stroke risk factor management  Therapy recommendations:  HH OT  Disposition:  home   Today, 03/25/2020, Stephanie Foster is being seen for hospital follow-up accompanied by her son, Stephanie Foster.  She has been recovering well from a stroke standpoint with residual slightly decreased right hand dexterity and denies new stroke/TIA symptoms.  She reports continued gradual improvement.  She is left-hand dominant.  30-day cardiac event monitor did show 2 events of AF with RVR on 2/5 and 2/6.  Per Dr. Earmon Phoenix recommendations, aspirin discontinued and initiated apixaban 5 mg twice daily which was started today.  Denies bleeding or bruising concerns at this time.  She has remained on atorvastatin without myalgias.  Blood pressure today 137/75.  Routinely monitors at  home which has been stable.  She is requesting transfer to a neurologist in [place], [place] which is closer to her home.  No further concerns at this time.   ROS:   14 system review of systems performed and negative with exception of those listed in HPI  PMH:  Past Medical History:  Diagnosis Date  . Cataract   . Diverticulosis   . GERD (gastroesophageal reflux disease)   . H/O hiatal hernia   .  Hernia   . Hypertension     PSH:  Past Surgical History:  Procedure Laterality Date  . ABDOMINAL HYSTERECTOMY    . BLADDER SUSPENSION    . EYE SURGERY    . TONSILLECTOMY      Social History:  Social History   Socioeconomic History  . Marital status: Widowed    Spouse name: Not on file  . Number of children: Not on file  . Years of education: Not on file  . Highest education level: Not on file  Occupational History  . Not on file  Tobacco Use  . Smoking status: Never Smoker  . Smokeless tobacco: Never Used  Substance and Sexual Activity  . Alcohol use: No  . Drug use: No  . Sexual activity: Not on file  Other Topics Concern  . Not on file  Social History Narrative  . Not on file   Social Determinants of Health   Financial Resource Strain: Not on file  Food Insecurity: Not on file  Transportation Needs: Not on file  Physical Activity: Not on file  Stress: Not on file  Social Connections: Not on file  Intimate Partner Violence: Not on file    Family History:  Family History  Problem Relation Age of Onset  . Coronary artery disease Father   . Coronary artery disease Brother     Medications:   Current Outpatient Medications on File Prior to Visit  Medication Sig Dispense Refill  . apixaban (ELIQUIS) 5 MG TABS tablet Take 1 tablet (5 mg total) by mouth 2 (two) times daily. 60 tablet 6  . atorvastatin (LIPITOR) 20 MG tablet Take 1 tablet (20 mg total) by mouth daily. 30 tablet 0  . iron polysaccharides (NIFEREX) 150 MG capsule Take 1 capsule (150 mg total) by mouth daily. 30 capsule 0   No current facility-administered medications on file prior to visit.    Allergies:   Allergies  Allergen Reactions  . Ciprofloxacin Hives and Itching  . Aspartame And Phenylalanine     "heart felt like swelling, top of head felt like blood pressure coming out of top"  . Codeine Nausea Only  . Diovan [Valsartan]     "makes me weak"  . Flagyl [Metronidazole] Diarrhea and  Nausea And Vomiting  . Macrobid [Nitrofurantoin Macrocrystal] Nausea Only  . Phenylalanine Other (See Comments)    "heart felt like swelling, top of head felt like blood pressure coming out of top"  . Prednisone     "makes me feel so weak that I can't walk across the floor"  . Sulfa Antibiotics Nausea Only  . Vitasana [Actical]     "tired, arms and legs felt like going down"  . Aspirin Palpitations      OBJECTIVE:  Physical Exam  Vitals:   03/25/20 1320  BP: 137/75  Pulse: 70  Weight: 141 lb 9.6 oz (64.2 kg)  Height: 5' (1.524 m)   Body mass index is 27.65 kg/m. No exam data present  No flowsheet data found.  General: well developed, well nourished,  very pleasant elderly Caucasian female, seated, in no evident distress Head: head normocephalic and atraumatic.   Neck: supple with no carotid or supraclavicular bruits Cardiovascular: irregular rate and rhythm, no murmurs Musculoskeletal: no deformity Skin:  no rash/petichiae Vascular:  Normal pulses all extremities   Neurologic Exam Mental Status: Awake and fully alert.   Fluent speech and language.  Oriented to place and time. Recent and remote memory intact. Attention span, concentration and fund of knowledge appropriate. Mood and affect appropriate.  Cranial Nerves: Fundoscopic exam reveals sharp disc margins. Pupils equal, briskly reactive to light. Extraocular movements full without nystagmus. Visual fields full to confrontation. Hearing intact. Facial sensation intact. Face, tongue, palate moves normally and symmetrically.  Motor: Normal bulk and tone. Normal strength in all tested extremity muscles except slightly decreased right hand dexterity Sensory.: intact to touch , pinprick , position and vibratory sensation.  Coordination: Rapid alternating movements normal in all extremities except slightly decreased right hand dexterity. Finger-to-nose and heel-to-shin performed accurately bilaterally. Gait and Station:  Arises from chair without difficulty. Stance is normal. Gait demonstrates normal stride length and balance with use of cane (baseline) Reflexes: 1+ and symmetric. Toes downgoing.     NIHSS  0 Modified Rankin  2      ASSESSMENT: Stephanie Foster is a 87 y.o. year old female presented with sudden onset RUE/hand weakness on 02/22/2020 stroke work-up revealing small cortical acute infarct in the left perirolandic region, embolic pattern secondary to unclear source although high suspicion of AF. Vascular risk factors include PACs on BB, HTN, HLD and advanced age as well as new diagnosis of atrial fibrillation.      PLAN:  1. L perirolandic stroke, cryptogenic:  a. Residual deficit: Slightly decreased right hand dexterity.  Discussed exercises to do at home for likely ongoing improvement b. Continue Eliquis (apixaban) daily  and atorvastatin 20 mg daily for secondary stroke prevention.   c. Discussed secondary stroke prevention measures and importance of close PCP follow up for aggressive stroke risk factor management  2. Atrial fibrillation, new dx: 2 events on cardiac monitor 2/5 and 2/6.  Likely stroke etiology.  CHA2DS2-VASc score of at least 6 indicating anticoagulation candidate.  Started on Eliquis 5 mg twice daily today per cardiology recommendations.  She has follow-up with cardiology scheduled next week 3. At risk for sleep apnea: She declines further evaluation at this time but will further consider and will follow up with cardiology if she wishes to proceed.  Discussed increased risk of untreated sleep apnea regards to recurrent stroke and cardiac conditions.  Patient education provided 4. HTN: BP goal <130/90.  Stable on metoprolol per PCP 5. HLD: LDL goal <70. Recent LDL 99 therefore initiated atorvastatin 20 mg daily (low dose indicated given advanced age and not significantly high LDL).  Request follow-up with PCP in the next 1 to 2 months for repeat lipid panel as well as ongoing  prescribing of atorvastatin    Per patient request, referral placed to neurology office in [facility] [place] to establish care closer to patient's home She was advised to call office with any questions or concerns during the interval time   CC:  [facility] provider: Dr. Boris Sharper, Terrence Dupont, PA    I spent 45 minutes of face-to-face and non-face-to-face time with patient and son.  This included previsit chart review physician pertinent progress notes, lab work and imaging, lab review, study review, order entry, electronic health record documentation, patient  education regarding recent stroke with residual deficits, new diagnosis of A. fib and initiating importance of managing stroke risk factors and answered all other questions to patient and son's satisfaction   Ihor Austin, AGNP-BC  [facility] [address] [place], [place] [address]  Phone [phone] Fax [phone] Note: This document was prepared with digital dictation and possible smart phrase technology. Any transcriptional errors that result from this process are unintentional.

## 2020-03-25 NOTE — Patient Instructions (Signed)
Continue Eliquis (apixaban) daily  and atorvastatin 20 mg daily for secondary stroke prevention  Follow-up with cardiology as scheduled for further discussion of atrial fibrillation and ongoing use of Eliquis Highly consider further evaluation for possible underlying sleep apnea which may be contributing to your daytime fatigue as well as increased risk of recurrent stroke if not treated  Continue to follow up with PCP regarding cholesterol and blood pressure management  Maintain strict control of hypertension with blood pressure goal below 130/90 and cholesterol with LDL cholesterol (bad cholesterol) goal below 70 mg/dL.   Referral will be placed to [facility] neurology location as requested   During the interval time, please do not hesitate to reach out with any questions or concerns!       Thank you for coming to see [place] at [facility]. I hope we have been able to provide you high quality care today.  You may receive a patient satisfaction survey over the next few weeks. We would appreciate your feedback and comments so that we may continue to improve ourselves and the health of our patients.    Sleep Apnea Sleep apnea affects breathing during sleep. It causes breathing to stop for a short time or to become shallow. It can also increase the risk of:  Heart attack.  Stroke.  Being very overweight (obese).  Diabetes.  Heart failure.  Irregular heartbeat. The goal of treatment is to help you breathe normally again. What are the causes? There are three kinds of sleep apnea:  Obstructive sleep apnea. This is caused by a blocked or collapsed airway.  Central sleep apnea. This happens when the brain does not send the right signals to the muscles that control breathing.  Mixed sleep apnea. This is a combination of obstructive and central sleep apnea. The most common cause of this condition is a collapsed or blocked airway. This can happen if:  Your throat  muscles are too relaxed.  Your tongue and tonsils are too large.  You are overweight.  Your airway is too small.   What increases the risk?  Being overweight.  Smoking.  Having a small airway.  Being older.  Being female.  Drinking alcohol.  Taking medicines to calm yourself (sedatives or tranquilizers).  Having family members with the condition. What are the signs or symptoms?  Trouble staying asleep.  Being sleepy or tired during the day.  Getting angry a lot.  Loud snoring.  Headaches in the morning.  Not being able to focus your mind (concentrate).  Forgetting things.  Less interest in sex.  Mood swings.  Personality changes.  Feelings of sadness (depression).  Waking up a lot during the night to pee (urinate).  Dry mouth.  Sore throat. How is this diagnosed?  Your medical history.  A physical exam.  A test that is done when you are sleeping (sleep study). The test is most often done in a sleep lab but may also be done at home. How is this treated?  Sleeping on your side.  Using a medicine to get rid of mucus in your nose (decongestant).  Avoiding the use of alcohol, medicines to help you relax, or certain pain medicines (narcotics).  Losing weight, if needed.  Changing your diet.  Not smoking.  Using a machine to open your airway while you sleep, such as: ? An oral appliance. This is a mouthpiece that shifts your lower jaw forward. ? A CPAP device. This device blows air through a mask when you  breathe out (exhale). ? An EPAP device. This has valves that you put in each nostril. ? A BPAP device. This device blows air through a mask when you breathe in (inhale) and breathe out.  Having surgery if other treatments do not work. It is important to get treatment for sleep apnea. Without treatment, it can lead to:  High blood pressure.  Coronary artery disease.  In men, not being able to have an erection (impotence).  Reduced  thinking ability.   Follow these instructions at home: Lifestyle  Make changes that your doctor recommends.  Eat a healthy diet.  Lose weight if needed.  Avoid alcohol, medicines to help you relax, and some pain medicines.  Do not use any products that contain nicotine or tobacco, such as cigarettes, e-cigarettes, and chewing tobacco. If you need help quitting, ask your doctor. General instructions  Take over-the-counter and prescription medicines only as told by your doctor.  If you were given a machine to use while you sleep, use it only as told by your doctor.  If you are having surgery, make sure to tell your doctor you have sleep apnea. You may need to bring your device with you.  Keep all follow-up visits as told by your doctor. This is important. Contact a doctor if:  The machine that you were given to use during sleep bothers you or does not seem to be working.  You do not get better.  You get worse. Get help right away if:  Your chest hurts.  You have trouble breathing in enough air.  You have an uncomfortable feeling in your back, arms, or stomach.  You have trouble talking.  One side of your body feels weak.  A part of your face is hanging down. These symptoms may be an emergency. Do not wait to see if the symptoms will go away. Get medical help right away. Call your local emergency services (911 in the [place] Do not drive yourself to the hospital. Summary  This condition affects breathing during sleep.  The most common cause is a collapsed or blocked airway.  The goal of treatment is to help you breathe normally while you sleep. This information is not intended to replace advice given to you by your health care provider. Make sure you discuss any questions you have with your health care provider. Document Revised: 11/18/2017 Document Reviewed: 09/27/2017 Elsevier Patient Education  2021 [employer].

## 2020-03-25 NOTE — Telephone Encounter (Signed)
It looks like there is being already taking care of please schedule follow-up with me

## 2020-03-26 ENCOUNTER — Telehealth: Payer: Self-pay | Admitting: Internal Medicine

## 2020-03-26 ENCOUNTER — Telehealth: Payer: Self-pay | Admitting: *Deleted

## 2020-03-26 DIAGNOSIS — I10 Essential (primary) hypertension: Secondary | ICD-10-CM | POA: Insufficient documentation

## 2020-03-26 DIAGNOSIS — K219 Gastro-esophageal reflux disease without esophagitis: Secondary | ICD-10-CM | POA: Insufficient documentation

## 2020-03-26 DIAGNOSIS — Z8719 Personal history of other diseases of the digestive system: Secondary | ICD-10-CM | POA: Insufficient documentation

## 2020-03-26 DIAGNOSIS — K5790 Diverticulosis of intestine, part unspecified, without perforation or abscess without bleeding: Secondary | ICD-10-CM | POA: Insufficient documentation

## 2020-03-26 NOTE — Telephone Encounter (Signed)
Follow Up:    Pt's son would like to talk to [facility] please. He said he talked to her earlier and he needs to talk to her again please.

## 2020-03-26 NOTE — Telephone Encounter (Addendum)
[facility] Cardiac Report 03/25/2020:  Auto Trigger Atrial flutter with variable conduction at 1:44 pm with heart rate of 160.  At the time patient was in the car as a passenger going to a doctor visit. Feeling okay today. Has not missed any doses of Eliquis. DOD Shari Prows reviewed and signed. Continue to monitor. Continue to monitor and plan with follow up with Dr. Bing Matter.

## 2020-03-26 NOTE — Telephone Encounter (Signed)
Spoke to [place] about the heart monitor his mom is wearing about when to take it off and how to send it back.  Verbalized understanding.

## 2020-03-31 ENCOUNTER — Telehealth: Payer: Self-pay | Admitting: Emergency Medicine

## 2020-03-31 DIAGNOSIS — Z7901 Long term (current) use of anticoagulants: Secondary | ICD-10-CM

## 2020-03-31 NOTE — Telephone Encounter (Signed)
Spoke to patient son. He will bring patient for labs this week per Dr. Bing Matter.

## 2020-04-02 ENCOUNTER — Other Ambulatory Visit: Payer: Self-pay | Admitting: Internal Medicine

## 2020-04-02 DIAGNOSIS — I4891 Unspecified atrial fibrillation: Secondary | ICD-10-CM

## 2020-04-02 DIAGNOSIS — I63412 Cerebral infarction due to embolism of left middle cerebral artery: Secondary | ICD-10-CM

## 2020-04-03 LAB — BASIC METABOLIC PANEL
BUN/Creatinine Ratio: 19 (ref 12–28)
BUN: 18 mg/dL (ref 10–36)
CO2: 23 mmol/L (ref 20–29)
Calcium: 9 mg/dL (ref 8.7–10.3)
Chloride: 107 mmol/L — ABNORMAL HIGH (ref 96–106)
Creatinine, Ser: 0.93 mg/dL (ref 0.57–1.00)
GFR calc Af Amer: 61 mL/min/{1.73_m2} (ref >59)
GFR calc non Af Amer: 53 mL/min/{1.73_m2} — ABNORMAL LOW (ref >59)
Glucose: 87 mg/dL (ref 65–99)
Potassium: 4.5 mmol/L (ref 3.5–5.2)
Sodium: 144 mmol/L (ref 134–144)

## 2020-04-03 LAB — CBC
Hematocrit: 33 % — ABNORMAL LOW (ref 34.0–46.6)
Hemoglobin: 9.4 g/dL — ABNORMAL LOW (ref 11.1–15.9)
MCH: 22.5 pg — ABNORMAL LOW (ref 26.6–33.0)
MCHC: 28.5 g/dL — ABNORMAL LOW (ref 31.5–35.7)
MCV: 79 fL (ref 79–97)
Platelets: 233 10*3/uL (ref 150–450)
RBC: 4.18 x10E6/uL (ref 3.77–5.28)
RDW: 17.9 % — ABNORMAL HIGH (ref 11.7–15.4)
WBC: 5.6 10*3/uL (ref 3.4–10.8)

## 2020-04-04 LAB — FECAL OCCULT BLOOD, IMMUNOCHEMICAL: Fecal Occult Bld: POSITIVE — AB

## 2020-04-07 ENCOUNTER — Other Ambulatory Visit: Payer: Self-pay

## 2020-04-07 ENCOUNTER — Telehealth: Payer: Self-pay | Admitting: Emergency Medicine

## 2020-04-07 ENCOUNTER — Ambulatory Visit (INDEPENDENT_AMBULATORY_CARE_PROVIDER_SITE_OTHER): Payer: Medicare Other | Admitting: Cardiology

## 2020-04-07 ENCOUNTER — Encounter: Payer: Self-pay | Admitting: Cardiology

## 2020-04-07 VITALS — BP 130/60 | HR 65 | Ht 60.0 in | Wt 143.0 lb

## 2020-04-07 DIAGNOSIS — I480 Paroxysmal atrial fibrillation: Secondary | ICD-10-CM

## 2020-04-07 DIAGNOSIS — I10 Essential (primary) hypertension: Secondary | ICD-10-CM

## 2020-04-07 DIAGNOSIS — I6930 Unspecified sequelae of cerebral infarction: Secondary | ICD-10-CM | POA: Diagnosis not present

## 2020-04-07 DIAGNOSIS — E785 Hyperlipidemia, unspecified: Secondary | ICD-10-CM

## 2020-04-07 NOTE — Addendum Note (Signed)
Addended by: Hazle Quant on: 04/07/2020 10:27 AM   Modules accepted: Orders

## 2020-04-07 NOTE — Telephone Encounter (Signed)
-----   Message from [name], MD sent at 04/07/2020 12:27 PM EST ----- She does have stool guaiac positive, she is also mildly anemic.  We did check CBC today still I see how it would look like today.  She need to see GI specialist

## 2020-04-07 NOTE — Patient Instructions (Signed)
Medication Instructions:  Your physician recommends that you continue on your current medications as directed. Please refer to the Current Medication list given to you today.  *If you need a refill on your cardiac medications before your next appointment, please call your pharmacy*   Lab Work: Your physician recommends that you return for lab work today: cbc    If you have labs (blood work) drawn today and your tests are completely normal, you will receive your results only by: . MyChart Message (if you have MyChart) OR . A paper copy in the mail If you have any lab test that is abnormal or we need to change your treatment, we will call you to review the results.   Testing/Procedures: None.    Follow-Up: At [facility], you and your health needs are our priority.  As part of our continuing mission to provide you with exceptional heart care, we have created designated Provider Care Teams.  These Care Teams include your primary Cardiologist (physician) and Advanced Practice Providers (APPs -  Physician Assistants and Nurse Practitioners) who all work together to provide you with the care you need, when you need it.  We recommend signing up for the patient portal called "MyChart".  Sign up information is provided on this After Visit Summary.  MyChart is used to connect with patients for Virtual Visits (Telemedicine).  Patients are able to view lab/test results, encounter notes, upcoming appointments, etc.  Non-urgent messages can be sent to your provider as well.   To learn more about what you can do with MyChart, go to [email or web address].    Your next appointment:   3 month(s)  The format for your next appointment:   In Person  Provider:   Robert Krasowski, MD   Other Instructions    

## 2020-04-07 NOTE — Progress Notes (Signed)
Cardiology Office Note:    Date:  04/07/2020   ID:  Stephanie Foster, Stephanie Foster [birth year], MRN [id number]  PCP:  Shellia Cleverly, PA  Cardiologist:  Gypsy Balsam, MD    Referring MD: Shellia Cleverly, [place]   Chief Complaint  Patient presents with  . Follow-up    History of Present Illness:    Stephanie Foster is a 88 y.o. female she was referred to [place] to be evaluated for explanation for his CVA that she suffered from.  Luckily she recovered quite nicely and doing well overall.  She is 87 years old but looks much better.  There is no neurological sequela of her CVA.  She did wear monitor and surprisingly monitor showed multiple episode of atrial fibrillation with controlled ventricular rate.  She was initiated with anticoagulation seems to be doing well.  She tells me that for last 30 years she did have some skipped beats did not bother her too much she did not pay much attention to it, she was taking some natural product to help with her heart and that seems to be helping.  Today she is complaining of prices for Eliquis.  She had to pay close to $600 every single month for this medication.  Past Medical History:  Diagnosis Date  . Cataract   . Chest pain 03/24/2012  . Diaphoresis 03/24/2012  . Diverticulosis   . Dry eye syndrome of both lacrimal glands 11/16/2017  . Dyslipidemia 02/28/2020  . Dyspnea on exertion 02/28/2020  . Essential hypertension 02/28/2020  . GERD (gastroesophageal reflux disease)   . H/O hiatal hernia   . Hernia   . Hypertension   . Late effect of cerebrovascular accident (CVA) 02/28/2020  . Nausea 03/24/2012  . Nonexudative age-related macular degeneration, bilateral, early dry stage 11/16/2017  . Pseudophakia of both eyes 11/16/2017  . Stroke [facility]) 02/22/2020    Past Surgical History:  Procedure Laterality Date  . ABDOMINAL HYSTERECTOMY    . BLADDER SUSPENSION    . EYE SURGERY    . TONSILLECTOMY      Current Medications: Current Meds  Medication Sig  . apixaban (ELIQUIS) 5  MG TABS tablet Take 1 tablet (5 mg total) by mouth 2 (two) times daily.  Marland Kitchen atorvastatin (LIPITOR) 20 MG tablet Take 1 tablet (20 mg total) by mouth daily.  . Cranberry-Vitamin C-Vitamin E 4200-20-3 MG-MG-UNIT CAPS Take 1 tablet by mouth daily.  . iron polysaccharides (NIFEREX) 150 MG capsule Take 1 capsule (150 mg total) by mouth daily.  . Phenazopyridine HCl (AZO URINARY PAIN PO) Take 1 tablet by mouth daily as needed (UTI pain).     Allergies:   Ciprofloxacin, Aspartame and phenylalanine, Codeine, Diovan [valsartan], Flagyl [metronidazole], Macrobid [nitrofurantoin macrocrystal], Phenylalanine, Prednisone, Sulfa antibiotics, Vitasana [actical], and Aspirin   Social History   Socioeconomic History  . Marital status: Widowed    Spouse name: Not on file  . Number of children: Not on file  . Years of education: Not on file  . Highest education level: Not on file  Occupational History  . Not on file  Tobacco Use  . Smoking status: Never Smoker  . Smokeless tobacco: Never Used  Substance and Sexual Activity  . Alcohol use: No  . Drug use: No  . Sexual activity: Not on file  Other Topics Concern  . Not on file  Social History Narrative  . Not on file   Social Determinants of Health   Financial Resource Strain: Not on file  Food Insecurity: Not on file  Transportation Needs: Not on file  Physical Activity: Not on file  Stress: Not on file  Social Connections: Not on file     Family History: The patient's family history includes Coronary artery disease in her brother and father. ROS:   Please see the history of present illness.    All 14 point review of systems negative except as described per history of present illness  EKGs/Labs/Other Studies Reviewed:      Recent Labs: 02/23/2020: ALT 10; TSH 5.572 04/02/2020: BUN 18; Creatinine, Ser 0.93; Hemoglobin 9.4; Platelets 233; Potassium 4.5; Sodium 144  Recent Lipid Panel    Component Value Date/Time   CHOL 179 02/24/2020  0154   TRIG 51 02/24/2020 0154   HDL 70 02/24/2020 0154   CHOLHDL 2.6 02/24/2020 0154   VLDL 10 02/24/2020 0154   LDLCALC 99 02/24/2020 0154    Physical Exam:    VS:  BP 130/60 (BP Location: Right Arm, Patient Position: Sitting)   Pulse 65   Ht 5' (1.524 m)   Wt 143 lb (64.9 kg)   SpO2 97%   BMI 27.93 kg/m     Wt Readings from Last 3 Encounters:  04/07/20 143 lb (64.9 kg)  03/25/20 141 lb 9.6 oz (64.2 kg)  02/28/20 139 lb (63 kg)     GEN:  Well nourished, well developed in no acute distress HEENT: Normal NECK: No JVD; No carotid bruits LYMPHATICS: No lymphadenopathy CARDIAC: RRR, no murmurs, no rubs, no gallops RESPIRATORY:  Clear to auscultation without rales, wheezing or rhonchi  ABDOMEN: Soft, non-tender, non-distended MUSCULOSKELETAL:  No edema; No deformity  SKIN: Warm and dry LOWER EXTREMITIES: no swelling NEUROLOGIC:  Alert and oriented x 3 PSYCHIATRIC:  Normal affect   ASSESSMENT:    1. Late effect of cerebrovascular accident (CVA)   2. Paroxysmal atrial fibrillation (HCC)   3. Essential hypertension   4. Dyslipidemia    PLAN:    In order of problems listed above:  1. Paroxysmal atrial fibrillation this is a new diagnosis.  Her chads 2 vascular equals 5.  We will continue anticoagulation explained to her as well as to her son the rationale and the reason for that medication she understand and she is willing to continue.  I would not initiate any antiarrhythmic therapy because she is barely symptomatic with this.  And because of her fragility I prefer not to add any new medication to her medical regimen.  We do not also need to have any AV blocking agents since her rate was controlled when she was in atrial fibrillation. 2. Late effect of CVA.  Stable from that point review no new problems. 3. Essential hypertension: Blood pressure well controlled continue present management. 4. Dyslipidemia, I did review her K PN from February 24, 2020 showing LDL of 99 and  HDL of 70, she is on Lipitor 20 which I will continue.   Medication Adjustments/Labs and Tests Ordered: Current medicines are reviewed at length with the patient today.  Concerns regarding medicines are outlined above.  No orders of the defined types were placed in this encounter.  Medication changes: No orders of the defined types were placed in this encounter.   Signed, [name], MD, [facility] 04/07/2020 10:23 AM    [facility]

## 2020-04-08 LAB — CBC
Hematocrit: 33.3 % — ABNORMAL LOW (ref 34.0–46.6)
Hemoglobin: 10 g/dL — ABNORMAL LOW (ref 11.1–15.9)
MCH: 22.4 pg — ABNORMAL LOW (ref 26.6–33.0)
MCHC: 30 g/dL — ABNORMAL LOW (ref 31.5–35.7)
MCV: 75 fL — ABNORMAL LOW (ref 79–97)
Platelets: 240 10*3/uL (ref 150–450)
RBC: 4.47 x10E6/uL (ref 3.77–5.28)
RDW: 17.1 % — ABNORMAL HIGH (ref 11.7–15.4)
WBC: 4.8 10*3/uL (ref 3.4–10.8)

## 2020-04-17 DIAGNOSIS — K219 Gastro-esophageal reflux disease without esophagitis: Secondary | ICD-10-CM | POA: Insufficient documentation

## 2020-04-24 ENCOUNTER — Ambulatory Visit: Payer: BLUE CROSS/BLUE SHIELD | Admitting: Cardiology

## 2020-06-11 DIAGNOSIS — M1712 Unilateral primary osteoarthritis, left knee: Secondary | ICD-10-CM | POA: Insufficient documentation

## 2020-07-08 ENCOUNTER — Other Ambulatory Visit: Payer: Self-pay

## 2020-07-08 DIAGNOSIS — H269 Unspecified cataract: Secondary | ICD-10-CM | POA: Insufficient documentation

## 2020-07-08 DIAGNOSIS — Z8719 Personal history of other diseases of the digestive system: Secondary | ICD-10-CM | POA: Insufficient documentation

## 2020-07-09 ENCOUNTER — Encounter: Payer: Self-pay | Admitting: Cardiology

## 2020-07-09 ENCOUNTER — Other Ambulatory Visit: Payer: Self-pay

## 2020-07-09 ENCOUNTER — Ambulatory Visit (INDEPENDENT_AMBULATORY_CARE_PROVIDER_SITE_OTHER): Payer: Medicare Other | Admitting: Cardiology

## 2020-07-09 VITALS — BP 136/62 | HR 74 | Ht 60.0 in | Wt 144.0 lb

## 2020-07-09 DIAGNOSIS — I10 Essential (primary) hypertension: Secondary | ICD-10-CM | POA: Diagnosis not present

## 2020-07-09 DIAGNOSIS — I6930 Unspecified sequelae of cerebral infarction: Secondary | ICD-10-CM | POA: Diagnosis not present

## 2020-07-09 DIAGNOSIS — R0609 Other forms of dyspnea: Secondary | ICD-10-CM

## 2020-07-09 DIAGNOSIS — R0600 Dyspnea, unspecified: Secondary | ICD-10-CM

## 2020-07-09 DIAGNOSIS — I480 Paroxysmal atrial fibrillation: Secondary | ICD-10-CM

## 2020-07-09 NOTE — Progress Notes (Signed)
Cardiology Office Note:    Date:  07/09/2020   ID:  Stephanie Foster, DOB 08-Nov-1924, MRN [id number]  PCP:  Shellia Cleverly, PA  Cardiologist:  Gypsy Balsam, MD    Referring MD: Shellia Cleverly, [place]   Chief Complaint  Patient presents with  . Follow-up  Doing fine  History of Present Illness:    Stephanie Foster is a 87 y.o. female with past medical history significant for CVA, after that she was discovered to have paroxysmal atrial fibrillation, she is being anticoagulated since that time, also essential hypertension, GERD, dyslipidemia.  She comes today to my office for follow-up.  Overall she is doing great.  Denies have any chest pain, tightness, pressure, burning in the chest.  She is living fairly sedentary lifestyle.  She likes to watch TV a lot and she also sits in the porch looking around.  Past Medical History:  Diagnosis Date  . Cataract   . Chest pain 03/24/2012  . Diaphoresis 03/24/2012  . Diverticulosis   . Dry eye syndrome of both lacrimal glands 11/16/2017  . Dyslipidemia 02/28/2020  . Dyspnea on exertion 02/28/2020  . Essential hypertension 02/28/2020  . GERD (gastroesophageal reflux disease)   . H/O hiatal hernia   . Hernia   . Hypertension   . Late effect of cerebrovascular accident (CVA) 02/28/2020  . Nausea 03/24/2012  . Nonexudative age-related macular degeneration, bilateral, early dry stage 11/16/2017  . Pseudophakia of both eyes 11/16/2017  . Stroke [facility]) 02/22/2020    Past Surgical History:  Procedure Laterality Date  . ABDOMINAL HYSTERECTOMY    . BLADDER SUSPENSION    . EYE SURGERY    . TONSILLECTOMY      Current Medications: Current Meds  Medication Sig  . apixaban (ELIQUIS) 5 MG TABS tablet Take 1 tablet (5 mg total) by mouth 2 (two) times daily.  Marland Kitchen atorvastatin (LIPITOR) 20 MG tablet Take 1 tablet (20 mg total) by mouth daily.  . Cranberry-Vitamin C-Vitamin E 4200-20-3 MG-MG-UNIT CAPS Take 1 tablet by mouth daily.  . Ferrous Fumarate (HEMOCYTE - 106 MG FE)  324 (106 Fe) MG TABS tablet Take 324 mg of iron by mouth daily with breakfast.  . iron polysaccharides (NIFEREX) 150 MG capsule Take 1 capsule (150 mg total) by mouth daily.  . meloxicam (MOBIC) 7.5 MG tablet Take 1 tablet by mouth daily.  . metoprolol succinate (TOPROL-XL) 25 MG 24 hr tablet Take 12.5 mg by mouth 2 (two) times daily as needed (Palpitations). palpitations  . Phenazopyridine HCl (AZO URINARY PAIN PO) Take 1 tablet by mouth daily as needed (UTI pain). Unknown strength  . triamcinolone cream (KENALOG) 0.5 % Apply 1 application topically in the morning and at bedtime.     Allergies:   Ciprofloxacin, Aspartame and phenylalanine, Codeine, Diovan [valsartan], Flagyl [metronidazole], Macrobid [nitrofurantoin macrocrystal], Phenylalanine, Prednisone, Sulfa antibiotics, Vitasana [actical], and Aspirin   Social History   Socioeconomic History  . Marital status: Widowed    Spouse name: Not on file  . Number of children: Not on file  . Years of education: Not on file  . Highest education level: Not on file  Occupational History  . Not on file  Tobacco Use  . Smoking status: Never Smoker  . Smokeless tobacco: Never Used  Substance and Sexual Activity  . Alcohol use: No  . Drug use: No  . Sexual activity: Not on file  Other Topics Concern  . Not on file  Social History Narrative  . Not  on file   Social Determinants of Health   Financial Resource Strain: Not on file  Food Insecurity: Not on file  Transportation Needs: Not on file  Physical Activity: Not on file  Stress: Not on file  Social Connections: Not on file     Family History: The patient's family history includes Coronary artery disease in her brother and father. ROS:   Please see the history of present illness.    All 14 point review of systems negative except as described per history of present illness  EKGs/Labs/Other Studies Reviewed:      Recent Labs: 02/23/2020: ALT 10; TSH 5.572 04/02/2020: BUN 18;  Creatinine, Ser 0.93; Potassium 4.5; Sodium 144 04/07/2020: Hemoglobin 10.0; Platelets 240  Recent Lipid Panel    Component Value Date/Time   CHOL 179 02/24/2020 0154   TRIG 51 02/24/2020 0154   HDL 70 02/24/2020 0154   CHOLHDL 2.6 02/24/2020 0154   VLDL 10 02/24/2020 0154   LDLCALC 99 02/24/2020 0154    Physical Exam:    VS:  BP 136/62 (BP Location: Right Arm, Patient Position: Sitting)   Pulse 74   Ht 5' (1.524 m)   Wt 144 lb (65.3 kg)   SpO2 94%   BMI 28.12 kg/m     Wt Readings from Last 3 Encounters:  07/09/20 144 lb (65.3 kg)  04/07/20 143 lb (64.9 kg)  03/25/20 141 lb 9.6 oz (64.2 kg)     GEN:  Well nourished, well developed in no acute distress HEENT: Normal NECK: No JVD; No carotid bruits LYMPHATICS: No lymphadenopathy CARDIAC: RRR, no murmurs, no rubs, no gallops RESPIRATORY:  Clear to auscultation without rales, wheezing or rhonchi  ABDOMEN: Soft, non-tender, non-distended MUSCULOSKELETAL:  No edema; No deformity  SKIN: Warm and dry LOWER EXTREMITIES: no swelling NEUROLOGIC:  Alert and oriented x 3 PSYCHIATRIC:  Normal affect   ASSESSMENT:    1. Paroxysmal atrial fibrillation (HCC)   2. Essential hypertension   3. Dyspnea on exertion   4. Late effect of cerebrovascular accident (CVA)    PLAN:    In order of problems listed above:  1. Paroxysmal atrial fibrillation maintaining sinus rhythm denies have any palpitations.  Because of advanced age and complete lack of symptoms we will simply continue anticoagulation. 2. Essential hypertension, blood pressure seems to well controlled continue present management. 3. Dyspnea on exertion very minimal because she does not have very minimal activity.  She like it that way.  Therefore we will continue present management. 4. Late effect of CVA.  Stable   Medication Adjustments/Labs and Tests Ordered: Current medicines are reviewed at length with the patient today.  Concerns regarding medicines are outlined  above.  No orders of the defined types were placed in this encounter.  Medication changes: No orders of the defined types were placed in this encounter.   Signed, [name], MD, [facility] 07/09/2020 12:09 PM    [facility]

## 2020-07-09 NOTE — Patient Instructions (Signed)

## 2020-10-19 ENCOUNTER — Other Ambulatory Visit: Payer: Self-pay | Admitting: Cardiology

## 2020-10-21 NOTE — Telephone Encounter (Signed)
Prescription refill request for Eliquis received. Indication:afib Last office visit:krasowski 07/09/20 Scr:0.99 10/06/20 Age: 89f Weight:65.3kg 

## 2020-10-22 ENCOUNTER — Other Ambulatory Visit: Payer: Self-pay

## 2020-10-22 MED ORDER — APIXABAN 5 MG PO TABS: 5.0000 mg | ORAL_TABLET | Freq: Two times a day (BID) | ORAL | 1 refills | Status: DC

## 2020-10-22 NOTE — Telephone Encounter (Signed)
Prescription refill request for Eliquis received. Indication:afib Last office visit:krasowski 07/09/20 Scr:0.99 10/06/20 Age: 15f Weight:65.3kg

## 2021-01-12 ENCOUNTER — Encounter: Payer: Self-pay | Admitting: Cardiology

## 2021-01-12 ENCOUNTER — Other Ambulatory Visit: Payer: Self-pay

## 2021-01-12 ENCOUNTER — Ambulatory Visit (INDEPENDENT_AMBULATORY_CARE_PROVIDER_SITE_OTHER): Payer: Medicare Other | Admitting: Cardiology

## 2021-01-12 VITALS — BP 142/60 | HR 97 | Ht 60.0 in | Wt 146.0 lb

## 2021-01-12 DIAGNOSIS — E785 Hyperlipidemia, unspecified: Secondary | ICD-10-CM

## 2021-01-12 DIAGNOSIS — I480 Paroxysmal atrial fibrillation: Secondary | ICD-10-CM | POA: Diagnosis not present

## 2021-01-12 DIAGNOSIS — I6930 Unspecified sequelae of cerebral infarction: Secondary | ICD-10-CM

## 2021-01-12 DIAGNOSIS — R0609 Other forms of dyspnea: Secondary | ICD-10-CM

## 2021-01-12 DIAGNOSIS — I10 Essential (primary) hypertension: Secondary | ICD-10-CM

## 2021-01-12 NOTE — Addendum Note (Signed)
Addended by: Heywood Bene on: 01/12/2021 04:41 PM   Modules accepted: Orders

## 2021-01-12 NOTE — Progress Notes (Signed)
Cardiology Office Note:    Date:  01/12/2021   ID:  Stephanie Foster, DOB 1925/01/20, MRN [id number]  PCP:  Manfred Shirts, PA  Cardiologist:  Jenne Campus, MD    Referring MD: Manfred Shirts, [place]   Chief Complaint  Patient presents with   Fatigue    History of Present Illness:    Stephanie Foster is a 87 y.o. female with past medical history significant for CVA, paroxysmal atrial fibrillation, she is anticoagulated, essential hypertension, dyslipidemia.  January 12, 2021: She is coming today 2 months of follow-up.  Overall she complain of having weakness and fatigue.  She said for 2 days she felt her heart was not beating right today however she is very regular.  I will do EKG to confirm the rhythm.  Denies having any chest pain tightness squeezing pressure burning chest no palpitations no swelling of lower extremities.  She also have difficulty sleeping this is ongoing problem for years.  Past Medical History:  Diagnosis Date   Cataract    Chest pain 03/24/2012   Diaphoresis 03/24/2012   Diverticulosis    Dry eye syndrome of both lacrimal glands 11/16/2017   Dyslipidemia 02/28/2020   Dyspnea on exertion 02/28/2020   Essential hypertension 02/28/2020   GERD (gastroesophageal reflux disease)    H/O hiatal hernia    Hernia    Hypertension    Late effect of cerebrovascular accident (CVA) 02/28/2020   Nausea 03/24/2012   Nonexudative age-related macular degeneration, bilateral, early dry stage 11/16/2017   Pseudophakia of both eyes 11/16/2017   Stroke ([place]) 02/22/2020    Past Surgical History:  Procedure Laterality Date   ABDOMINAL HYSTERECTOMY     BLADDER SUSPENSION     EYE SURGERY     TONSILLECTOMY      Current Medications: Current Meds  Medication Sig   apixaban (ELIQUIS) 5 MG TABS tablet Take 1 tablet (5 mg total) by mouth 2 (two) times daily.   atorvastatin (LIPITOR) 20 MG tablet Take 1 tablet (20 mg total) by mouth daily.   Cranberry-Vitamin C-Vitamin E 4200-20-3 MG-MG-UNIT CAPS  Take 1 tablet by mouth daily.   Ferrous Fumarate (HEMOCYTE - 106 MG FE) 324 (106 Fe) MG TABS tablet Take 324 mg of iron by mouth daily with breakfast.   [DISCONTINUED] iron polysaccharides (NIFEREX) 150 MG capsule Take 1 capsule (150 mg total) by mouth daily.     Allergies:   Ciprofloxacin, Aspartame and phenylalanine, Codeine, Diovan [valsartan], Flagyl [metronidazole], Macrobid [nitrofurantoin macrocrystal], Phenylalanine, Prednisone, Sulfa antibiotics, Vitasana [actical], and Aspirin   Social History   Socioeconomic History   Marital status: Widowed    Spouse name: Not on file   Number of children: Not on file   Years of education: Not on file   Highest education level: Not on file  Occupational History   Not on file  Tobacco Use   Smoking status: Never   Smokeless tobacco: Never  Substance and Sexual Activity   Alcohol use: No   Drug use: No   Sexual activity: Not on file  Other Topics Concern   Not on file  Social History Narrative   Not on file   Social Determinants of Health   Financial Resource Strain: Not on file  Food Insecurity: Not on file  Transportation Needs: Not on file  Physical Activity: Not on file  Stress: Not on file  Social Connections: Not on file     Family History: The patient's family history includes Coronary artery disease  in her brother and father. ROS:   Please see the history of present illness.    All 14 point review of systems negative except as described per history of present illness  EKGs/Labs/Other Studies Reviewed:      Recent Labs: 02/23/2020: ALT 10; TSH 5.572 04/02/2020: BUN 18; Creatinine, Ser 0.93; Potassium 4.5; Sodium 144 04/07/2020: Hemoglobin 10.0; Platelets 240  Recent Lipid Panel    Component Value Date/Time   CHOL 179 02/24/2020 0154   TRIG 51 02/24/2020 0154   HDL 70 02/24/2020 0154   CHOLHDL 2.6 02/24/2020 0154   VLDL 10 02/24/2020 0154   LDLCALC 99 02/24/2020 0154    Physical Exam:    VS:  BP (!) 142/60  (BP Location: Right Arm)   Pulse 97   Ht 5' (1.524 m)   Wt 146 lb (66.2 kg)   SpO2 93%   BMI 28.51 kg/m     Wt Readings from Last 3 Encounters:  01/12/21 146 lb (66.2 kg)  07/09/20 144 lb (65.3 kg)  04/07/20 143 lb (64.9 kg)     GEN:  Well nourished, well developed in no acute distress HEENT: Normal NECK: No JVD; No carotid bruits LYMPHATICS: No lymphadenopathy CARDIAC: RRR, no murmurs, no rubs, no gallops RESPIRATORY:  Clear to auscultation without rales, wheezing or rhonchi  ABDOMEN: Soft, non-tender, non-distended MUSCULOSKELETAL:  No edema; No deformity  SKIN: Warm and dry LOWER EXTREMITIES: no swelling NEUROLOGIC:  Alert and oriented x 3 PSYCHIATRIC:  Normal affect   ASSESSMENT:    1. Paroxysmal atrial fibrillation (HCC)   2. Essential hypertension   3. Dyspnea on exertion   4. Dyslipidemia   5. Late effect of cerebrovascular accident (CVA)    PLAN:    In order of problems listed above:  Paroxysmal atrial fibrillation.  We will do EKG today to check to rhythm however she sounds very regular.  I am worried that she may have had episode of atrial fibrillation in the last 2 days that is when she felt lousy.  She is anticoagulated which I will continue, I wanted to put monitor on her however we both concluded that those episodes so rare we wait for another episode before we do it. Essential hypertension blood pressure seems to well controlled continue present management. Dyspnea on exertion, stable.  Overall health admits she is 87 years old she is doing quite well for age. Dyslipidemia I did review K PN which show me her LDL 99 HDL 70.  We will continue present management. Profound weakness and fatigue.  TSH 5.57 that was done in January.  I will recheck her thyroid profile today, complete metabolic panel and CBC.   Medication Adjustments/Labs and Tests Ordered: Current medicines are reviewed at length with the patient today.  Concerns regarding medicines are outlined  above.  No orders of the defined types were placed in this encounter.  Medication changes: No orders of the defined types were placed in this encounter.   Signed, [name], MD, [facility] 01/12/2021 11:19 AM    [facility]

## 2021-01-12 NOTE — Patient Instructions (Signed)
Medication Instructions:  Your physician recommends that you continue on your current medications as directed. Please refer to the Current Medication list given to you today.  *If you need a refill on your cardiac medications before your next appointment, please call your pharmacy*   Lab Work: Your physician recommends that you return for lab work today: tsh, cbc, cmp   If you have labs (blood work) drawn today and your tests are completely normal, you will receive your results only by: MyChart Message (if you have MyChart) OR A paper copy in the mail If you have any lab test that is abnormal or we need to change your treatment, we will call you to review the results.   Testing/Procedures: None   Follow-Up: At [facility], you and your health needs are our priority.  As part of our continuing mission to provide you with exceptional heart care, we have created designated Provider Care Teams.  These Care Teams include your primary Cardiologist (physician) and Advanced Practice Providers (APPs -  Physician Assistants and Nurse Practitioners) who all work together to provide you with the care you need, when you need it.  We recommend signing up for the patient portal called "MyChart".  Sign up information is provided on this After Visit Summary.  MyChart is used to connect with patients for Virtual Visits (Telemedicine).  Patients are able to view lab/test results, encounter notes, upcoming appointments, etc.  Non-urgent messages can be sent to your provider as well.   To learn more about what you can do with MyChart, go to [email or web address].    Your next appointment:   3 month(s)  The format for your next appointment:   In Person  Provider:   Gypsy Balsam, MD    Other Instructions

## 2021-01-12 NOTE — Addendum Note (Signed)
Addended by: Hazle Quant on: 01/12/2021 11:27 AM   Modules accepted: Orders

## 2021-01-13 LAB — COMPREHENSIVE METABOLIC PANEL
ALT: 12 IU/L (ref 0–32)
AST: 18 IU/L (ref 0–40)
Albumin/Globulin Ratio: 1.9 (ref 1.2–2.2)
Albumin: 4.1 g/dL (ref 3.5–4.6)
Alkaline Phosphatase: 110 IU/L (ref 44–121)
BUN/Creatinine Ratio: 18 (ref 12–28)
BUN: 19 mg/dL (ref 10–36)
Bilirubin Total: 0.6 mg/dL (ref 0.0–1.2)
CO2: 25 mmol/L (ref 20–29)
Calcium: 9.4 mg/dL (ref 8.7–10.3)
Chloride: 102 mmol/L (ref 96–106)
Creatinine, Ser: 1.04 mg/dL — ABNORMAL HIGH (ref 0.57–1.00)
Globulin, Total: 2.2 g/dL (ref 1.5–4.5)
Glucose: 106 mg/dL — ABNORMAL HIGH (ref 70–99)
Potassium: 4 mmol/L (ref 3.5–5.2)
Sodium: 141 mmol/L (ref 134–144)
Total Protein: 6.3 g/dL (ref 6.0–8.5)
eGFR: 50 mL/min/{1.73_m2} — ABNORMAL LOW (ref >59)

## 2021-01-13 LAB — TSH: TSH: 3.19 u[IU]/mL (ref 0.450–4.500)

## 2021-01-13 LAB — CBC
Hematocrit: 43.8 % (ref 34.0–46.6)
Hemoglobin: 14.5 g/dL (ref 11.1–15.9)
MCH: 30 pg (ref 26.6–33.0)
MCHC: 33.1 g/dL (ref 31.5–35.7)
MCV: 91 fL (ref 79–97)
Platelets: 167 10*3/uL (ref 150–450)
RBC: 4.83 x10E6/uL (ref 3.77–5.28)
RDW: 12.9 % (ref 11.7–15.4)
WBC: 6.1 10*3/uL (ref 3.4–10.8)

## 2021-04-17 ENCOUNTER — Other Ambulatory Visit: Payer: Self-pay | Admitting: Cardiology

## 2021-04-17 NOTE — Telephone Encounter (Signed)
Prescription refill request for Eliquis received. ?Indication:Afib ?Last office visit:11/22 ?Scr:0.9 ?Age: 88 ?Weight:66.2 kg ? ?Prescription refilled ? ?

## 2021-04-20 ENCOUNTER — Other Ambulatory Visit: Payer: Self-pay

## 2021-04-20 ENCOUNTER — Encounter: Payer: Self-pay | Admitting: Cardiology

## 2021-04-20 ENCOUNTER — Ambulatory Visit (INDEPENDENT_AMBULATORY_CARE_PROVIDER_SITE_OTHER): Payer: Medicare Other | Admitting: Cardiology

## 2021-04-20 VITALS — BP 124/76 | HR 73 | Ht 60.0 in | Wt 149.0 lb

## 2021-04-20 DIAGNOSIS — R0609 Other forms of dyspnea: Secondary | ICD-10-CM | POA: Diagnosis not present

## 2021-04-20 DIAGNOSIS — I6930 Unspecified sequelae of cerebral infarction: Secondary | ICD-10-CM

## 2021-04-20 DIAGNOSIS — I10 Essential (primary) hypertension: Secondary | ICD-10-CM | POA: Diagnosis not present

## 2021-04-20 DIAGNOSIS — I480 Paroxysmal atrial fibrillation: Secondary | ICD-10-CM

## 2021-04-20 NOTE — Patient Instructions (Signed)

## 2021-04-20 NOTE — Progress Notes (Signed)
?Cardiology Office Note:   ? ?Date:  04/20/2021  ? ?ID:  Stephanie Foster, DOB 1924-04-12, MRN [id number] ? ?PCP:  Shellia Cleverly, PA  ?Cardiologist:  Gypsy Balsam, MD   ? ?Referring MD: Shellia Cleverly, PA  ? ?Chief Complaint  ?Patient presents with  ? Dizziness  ? Tremors  ?  L hand worst   ? ? ?History of Present Illness:   ? ?Stephanie Foster is a 88 y.o. female with past medical history significant for CVA, paroxysmal atrial fibrillation she is anticoagulated, essential hypertension, dyslipidemia. ?Comes today to my office for follow-up.  Overall doing well.  Described to have some palpitation when she mean by that is skipped beats and sometimes fluttering lasting only for few seconds.  There is no chest pain tightness squeezing pressure burning chest.  She complain of unsteady gait.  She always leans towards the left.  There is no significant orthostasis.  She also complained of having tremor in the left arm. ? ?Past Medical History:  ?Diagnosis Date  ? Cataract   ? Chest pain 03/24/2012  ? Diaphoresis 03/24/2012  ? Diverticulosis   ? Dry eye syndrome of both lacrimal glands 11/16/2017  ? Dyslipidemia 02/28/2020  ? Dyspnea on exertion 02/28/2020  ? Essential hypertension 02/28/2020  ? GERD (gastroesophageal reflux disease)   ? H/O hiatal hernia   ? Hernia   ? Hypertension   ? Late effect of cerebrovascular accident (CVA) 02/28/2020  ? Nausea 03/24/2012  ? Nonexudative age-related macular degeneration, bilateral, early dry stage 11/16/2017  ? Pseudophakia of both eyes 11/16/2017  ? Stroke [facility]) 02/22/2020  ? ? ?Past Surgical History:  ?Procedure Laterality Date  ? ABDOMINAL HYSTERECTOMY    ? BLADDER SUSPENSION    ? EYE SURGERY    ? TONSILLECTOMY    ? ? ?Current Medications: ?Current Meds  ?Medication Sig  ? atorvastatin (LIPITOR) 20 MG tablet Take 1 tablet (20 mg total) by mouth daily.  ? Cranberry-Vitamin C-Vitamin E 4200-20-3 MG-MG-UNIT CAPS Take 1 tablet by mouth daily.  ? ELIQUIS 5 MG TABS tablet TAKE 1 TABLET(5 MG) BY MOUTH TWICE  DAILY (Patient taking differently: Take 5 mg by mouth 2 (two) times daily.)  ? Ferrous Fumarate (HEMOCYTE - 106 MG FE) 324 (106 Fe) MG TABS tablet Take 324 mg of iron by mouth daily with breakfast.  ?  ? ?Allergies:   Ciprofloxacin, Aspartame and phenylalanine, Codeine, Diovan [valsartan], Flagyl [metronidazole], Macrobid [nitrofurantoin macrocrystal], Phenylalanine, Prednisone, Sulfa antibiotics, Vitasana [actical], and Aspirin  ? ?Social History  ? ?Socioeconomic History  ? Marital status: Widowed  ?  Spouse name: Not on file  ? Number of children: Not on file  ? Years of education: Not on file  ? Highest education level: Not on file  ?Occupational History  ? Not on file  ?Tobacco Use  ? Smoking status: Never  ? Smokeless tobacco: Never  ?Substance and Sexual Activity  ? Alcohol use: No  ? Drug use: No  ? Sexual activity: Not on file  ?Other Topics Concern  ? Not on file  ?Social History Narrative  ? Not on file  ? ?Social Determinants of Health  ? ?Financial Resource Strain: Not on file  ?Food Insecurity: Not on file  ?Transportation Needs: Not on file  ?Physical Activity: Not on file  ?Stress: Not on file  ?Social Connections: Not on file  ?  ? ?Family History: ?The patient's family history includes Coronary artery disease in her brother and father. ?ROS:   ?  Please see the history of present illness.    ?All 14 point review of systems negative except as described per history of present illness ? ?EKGs/Labs/Other Studies Reviewed:   ? ? ? ?Recent Labs: ?01/12/2021: ALT 12; BUN 19; Creatinine, Ser 1.04; Hemoglobin 14.5; Platelets 167; Potassium 4.0; Sodium 141; TSH 3.190  ?Recent Lipid Panel ?   ?Component Value Date/Time  ? CHOL 179 02/24/2020 0154  ? TRIG 51 02/24/2020 0154  ? HDL 70 02/24/2020 0154  ? CHOLHDL 2.6 02/24/2020 0154  ? VLDL 10 02/24/2020 0154  ? LDLCALC 99 02/24/2020 0154  ? ? ?Physical Exam:   ? ?VS:  BP 124/76 (BP Location: Right Arm, Patient Position: Sitting)   Pulse 73   Ht 5' (1.524 m)    Wt 149 lb (67.6 kg)   SpO2 94%   BMI 29.10 kg/m?    ? ?Wt Readings from Last 3 Encounters:  ?04/20/21 149 lb (67.6 kg)  ?01/12/21 146 lb (66.2 kg)  ?07/09/20 144 lb (65.3 kg)  ?  ? ?GEN:  Well nourished, well developed in no acute distress ?HEENT: Normal ?NECK: No JVD; No carotid bruits ?LYMPHATICS: No lymphadenopathy ?CARDIAC: RRR, no murmurs, no rubs, no gallops ?RESPIRATORY:  Clear to auscultation without rales, wheezing or rhonchi  ?ABDOMEN: Soft, non-tender, non-distended ?MUSCULOSKELETAL:  No edema; No deformity  ?SKIN: Warm and dry ?LOWER EXTREMITIES: no swelling ?NEUROLOGIC:  Alert and oriented x 3 ?PSYCHIATRIC:  Normal affect  ? ?ASSESSMENT:   ? ?1. Hypertension, unspecified type   ?2. Paroxysmal atrial fibrillation (HCC)   ?3. Late effect of cerebrovascular accident (CVA)   ?4. Dyspnea on exertion   ? ?PLAN:   ? ?In order of problems listed above: ? ?Paroxysmal atrial fibrillation EKG today shows sinus rhythm with some APCs.  We will continue present medication which include anticoagulation.  She is taking 5 mg of Eliquis she is a 80+ however her kidney function is not normal, her weight is a 67 kg, therefore we will continue 5 mg twice daily. ?Late effect of CVA I suspect her dizziness as well as tremor could be related to it.  Continue monitoring and supportive care ?Dyspnea on exertion, stable. ?Essential hypertension: Blood pressure well controlled. ? ? ?Medication Adjustments/Labs and Tests Ordered: ?Current medicines are reviewed at length with the patient today.  Concerns regarding medicines are outlined above.  ?Orders Placed This Encounter  ?Procedures  ? EKG 12-Lead  ? ?Medication changes: No orders of the defined types were placed in this encounter. ? ? ?Signed, ?[name], MD, [facility] ?04/20/2021 11:53 AM    ?[facility] ?

## 2021-05-21 ENCOUNTER — Emergency Department (HOSPITAL_BASED_OUTPATIENT_CLINIC_OR_DEPARTMENT_OTHER): Payer: Medicare Other

## 2021-05-21 ENCOUNTER — Emergency Department (HOSPITAL_BASED_OUTPATIENT_CLINIC_OR_DEPARTMENT_OTHER)
Admission: EM | Admit: 2021-05-21 | Discharge: 2021-05-21 | Disposition: A | Discharge status: Home / Self Care | Payer: Medicare Other | Attending: Emergency Medicine | Admitting: Emergency Medicine

## 2021-05-21 ENCOUNTER — Other Ambulatory Visit: Payer: Self-pay

## 2021-05-21 ENCOUNTER — Encounter (HOSPITAL_BASED_OUTPATIENT_CLINIC_OR_DEPARTMENT_OTHER): Payer: Self-pay | Admitting: Emergency Medicine

## 2021-05-21 DIAGNOSIS — Z7901 Long term (current) use of anticoagulants: Secondary | ICD-10-CM | POA: Insufficient documentation

## 2021-05-21 DIAGNOSIS — R079 Chest pain, unspecified: Secondary | ICD-10-CM | POA: Diagnosis present

## 2021-05-21 DIAGNOSIS — I4891 Unspecified atrial fibrillation: Secondary | ICD-10-CM | POA: Diagnosis not present

## 2021-05-21 LAB — CBC WITH DIFFERENTIAL/PLATELET
Abs Immature Granulocytes: 0.02 10*3/uL (ref 0.00–0.07)
Basophils Absolute: 0 10*3/uL (ref 0.0–0.1)
Basophils Relative: 1 %
Eosinophils Absolute: 0.1 10*3/uL (ref 0.0–0.5)
Eosinophils Relative: 2 %
HCT: 44.2 % (ref 36.0–46.0)
Hemoglobin: 14.6 g/dL (ref 12.0–15.0)
Immature Granulocytes: 0 %
Lymphocytes Relative: 39 %
Lymphs Abs: 2 10*3/uL (ref 0.7–4.0)
MCH: 30.6 pg (ref 26.0–34.0)
MCHC: 33 g/dL (ref 30.0–36.0)
MCV: 92.7 fL (ref 80.0–100.0)
Monocytes Absolute: 0.5 10*3/uL (ref 0.1–1.0)
Monocytes Relative: 10 %
Neutro Abs: 2.5 10*3/uL (ref 1.7–7.7)
Neutrophils Relative %: 48 %
Platelets: 141 10*3/uL — ABNORMAL LOW (ref 150–400)
RBC: 4.77 MIL/uL (ref 3.87–5.11)
RDW: 14.1 % (ref 11.5–15.5)
WBC: 5.1 10*3/uL (ref 4.0–10.5)
nRBC: 0 % (ref 0.0–0.2)

## 2021-05-21 LAB — COMPREHENSIVE METABOLIC PANEL
ALT: 14 U/L (ref 0–44)
AST: 22 U/L (ref 15–41)
Albumin: 3.3 g/dL — ABNORMAL LOW (ref 3.5–5.0)
Alkaline Phosphatase: 82 U/L (ref 38–126)
Anion gap: 7 (ref 5–15)
BUN: 24 mg/dL — ABNORMAL HIGH (ref 8–23)
CO2: 27 mmol/L (ref 22–32)
Calcium: 8.8 mg/dL — ABNORMAL LOW (ref 8.9–10.3)
Chloride: 107 mmol/L (ref 98–111)
Creatinine, Ser: 0.95 mg/dL (ref 0.44–1.00)
GFR, Estimated: 56 mL/min — ABNORMAL LOW (ref >60)
Glucose, Bld: 117 mg/dL — ABNORMAL HIGH (ref 70–99)
Potassium: 3.7 mmol/L (ref 3.5–5.1)
Sodium: 141 mmol/L (ref 135–145)
Total Bilirubin: 0.7 mg/dL (ref 0.3–1.2)
Total Protein: 6.1 g/dL — ABNORMAL LOW (ref 6.5–8.1)

## 2021-05-21 LAB — TROPONIN I (HIGH SENSITIVITY)
Troponin I (High Sensitivity): 7 ng/L (ref <18)
Troponin I (High Sensitivity): 8 ng/L (ref <18)

## 2021-05-21 NOTE — ED Provider Notes (Signed)
?Stephanie Foster ?Provider Note ? ? ?CSN: [id number] ?Arrival date & time: 05/21/21  1809 ? ?  ? ?History ? ?Chief Complaint  ?Patient presents with  ? Chest Pain  ? ? ?Stephanie Foster is a 88 y.o. female. ? ?The history is provided by the patient.  ?Chest Pain ?Pain location:  L chest ?Pain quality: aching   ?Pain radiates to:  Does not radiate ?Pain severity:  Mild ?Onset quality:  Gradual ?Duration:  1 week ?Timing:  Intermittent ?Progression:  Waxing and waning ?Chronicity:  New ?Context: at rest   ?Relieved by:  Nothing ?Worsened by:  Nothing ?Associated symptoms: no abdominal pain, no anorexia, no anxiety, no back pain, no claudication, no cough, no diaphoresis, no dizziness, no dysphagia, no fatigue, no fever, no headache, no lower extremity edema, no nausea, no near-syncope, no numbness, no orthopnea, no palpitations, no PND, no shortness of breath, no syncope, no vomiting and no weakness   ?Risk factors: high cholesterol   ?Risk factors: no coronary artery disease, no diabetes mellitus, no hypertension, no prior DVT/PE and no smoking   ?Risk factors comment:  Afib on eliquis ? ?  ? ?Home Medications ?Prior to Admission medications   ?Medication Sig Start Date End Date Taking? Authorizing Provider  ?atorvastatin (LIPITOR) 20 MG tablet Take 1 tablet (20 mg total) by mouth daily. 02/24/20 04/20/21  Rehman, Areeg N, DO  ?Cranberry-Vitamin C-Vitamin E 4200-20-3 MG-MG-UNIT CAPS Take 1 tablet by mouth daily.    [provider]  ?ELIQUIS 5 MG TABS tablet TAKE 1 TABLET(5 MG) BY MOUTH TWICE DAILY ?Patient taking differently: Take 5 mg by mouth 2 (two) times daily. 04/17/21   Park Liter, MD  ?Ferrous Fumarate (HEMOCYTE - 106 MG FE) 324 (106 Fe) MG TABS tablet Take 324 mg of iron by mouth daily with breakfast.    [provider]  ?   ? ?Allergies    ?Ciprofloxacin, Aspartame and phenylalanine, Codeine, Diovan [valsartan], Flagyl [metronidazole], Macrobid [nitrofurantoin  macrocrystal], Phenylalanine, Prednisone, Sulfa antibiotics, Vitasana [actical], and Aspirin   ? ?Review of Systems   ?Review of Systems  ?Constitutional:  Negative for diaphoresis, fatigue and fever.  ?HENT:  Negative for trouble swallowing.   ?Respiratory:  Negative for cough and shortness of breath.   ?Cardiovascular:  Positive for chest pain. Negative for palpitations, orthopnea, claudication, syncope, PND and near-syncope.  ?Gastrointestinal:  Negative for abdominal pain, anorexia, nausea and vomiting.  ?Musculoskeletal:  Negative for back pain.  ?Neurological:  Negative for dizziness, weakness, numbness and headaches.  ? ?Physical Exam ?Updated Vital Signs ?BP (!) 156/82   Pulse 67   Temp 98.1 ?F (36.7 ?C) (Oral)   Resp (!) 22   Ht 5' (1.524 m)   Wt 65.3 kg   SpO2 95%   BMI 28.12 kg/m?  ?Physical Exam ?Vitals and nursing note reviewed.  ?Constitutional:   ?   General: She is not in acute distress. ?   Appearance: She is well-developed.  ?HENT:  ?   Head: Normocephalic and atraumatic.  ?Eyes:  ?   Extraocular Movements: Extraocular movements intact.  ?   Conjunctiva/sclera: Conjunctivae normal.  ?   Pupils: Pupils are equal, round, and reactive to light.  ?Cardiovascular:  ?   Rate and Rhythm: Normal rate and regular rhythm.  ?   Pulses:     ?     Radial pulses are 2+ on the right side and 2+ on the left side.  ?   Heart  sounds: No murmur heard. ?Pulmonary:  ?   Effort: Pulmonary effort is normal. No respiratory distress.  ?   Breath sounds: Normal breath sounds. No decreased breath sounds, wheezing, rhonchi or rales.  ?Abdominal:  ?   Palpations: Abdomen is soft.  ?   Tenderness: There is no abdominal tenderness.  ?Musculoskeletal:     ?   General: No swelling. Normal range of motion.  ?   Cervical back: Normal range of motion and neck supple.  ?   Right lower leg: No edema.  ?   Left lower leg: No edema.  ?Skin: ?   General: Skin is warm and dry.  ?   Capillary Refill: Capillary refill takes less than  2 seconds.  ?Neurological:  ?   General: No focal deficit present.  ?   Mental Status: She is alert.  ?Psychiatric:     ?   Mood and Affect: Mood normal.  ? ? ?ED Results / Procedures / Treatments   ?Labs ?(all labs ordered are listed, but only abnormal results are displayed) ?Labs Reviewed  ?CBC WITH DIFFERENTIAL/PLATELET - Abnormal; Notable for the following components:  ?    Result Value  ? Platelets 141 (*)   ? All other components within normal limits  ?COMPREHENSIVE METABOLIC PANEL - Abnormal; Notable for the following components:  ? Glucose, Bld 117 (*)   ? BUN 24 (*)   ? Calcium 8.8 (*)   ? Total Protein 6.1 (*)   ? Albumin 3.3 (*)   ? GFR, Estimated 56 (*)   ? All other components within normal limits  ?TROPONIN I (HIGH SENSITIVITY)  ?TROPONIN I (HIGH SENSITIVITY)  ? ? ?EKG ?EKG Interpretation ? ?Date/Time:  Thursday May 21 2021 18:19:42 EDT ?Ventricular Rate:  88 ?PR Interval:  140 ?QRS Duration: 74 ?QT Interval:  338 ?QTC Calculation: 408 ?R Axis:   -3 ?Text Interpretation: Sinus rhythm with marked sinus arrhythmia with occasional Premature ventricular complexes Left axis deviation When compared with ECG of 22-Feb-2020 12:26, PREVIOUS ECG IS PRESENT Confirmed by Lennice Sites [phone]) on 05/21/2021 6:29:14 PM ? ?Radiology ?DG Chest 2 View ? ?Result Date: 05/21/2021 ?CLINICAL DATA:  Chest pain beginning earlier today. Intermittent shortness of breath. EXAM: CHEST - 2 VIEW COMPARISON:  03/24/2012 FINDINGS: Heart size is normal. Aortic atherosclerotic calcification noted. Both lungs are clear. Large hiatal hernia is seen containing air-fluid level. IMPRESSION: No active cardiopulmonary disease. Large hiatal hernia. Electronically Signed   By: Marlaine Hind M.D.   On: 05/21/2021 18:54   ? ?Procedures ?Procedures  ? ? ?Medications Ordered in ED ?Medications - No data to display ? ?ED Course/ Medical Decision Making/ A&P ?  ?                        ?Medical Decision Making ?Amount and/or Complexity of Data  Reviewed ?Labs: ordered. ?Radiology: ordered. ? ? ?FATOUMATTA HARDINGER is here with chest pain.  History of high cholesterol.  History of atrial fibrillation on Eliquis.  Intermittent chest pain for the last week.  Overall atypical story.  Not exertional.  Intermittent pain that comes and goes.  Heart score 4.  No significant medical history of cardiac disease.  Denies any infectious symptoms.  No abdominal pain.  Differential diagnosis is ACS versus reflux versus costochondritis versus pneumonia.  No concern for PE.  Wells criteria 0.  She is on Eliquis for A-fib.  Doubt PE.  We will get CBC, troponin, chest x-ray,  CMP. ? ?Per my review and interpretation of chest x-ray there is no pneumonia or pneumothorax.  Per my review interpretation of labs there is no significant anemia, electrolyte abnormality, kidney injury.  COVID is negative x2.  Patient did not want to stay for further cardiac work-up.  Given her elevated heart score and her risk factors did recommend further work-up inpatient but overall after discussion she preferred outpatient follow-up.  She is chest pain-free throughout my care.  She understands return precautions and discharged in ED in good condition.  She follows already with cardiology. ? ?This chart was dictated using voice recognition software.  Despite best efforts to proofread,  errors can occur which can change the documentation meaning.  ? ? ? ? ? ? ? ?Final Clinical Impression(s) / ED Diagnoses ?Final diagnoses:  ?Nonspecific chest pain  ? ? ?Rx / DC Orders ?ED Discharge Orders   ? ? None  ? ?  ? ? ?  ?Lennice Sites, DO ?05/21/21 2202 ? ?

## 2021-05-21 NOTE — ED Triage Notes (Signed)
Intermittent chest pain x 2 weeks.  Constant since noon today.  No sob.  No diaphoresis.  No N/V ?

## 2021-05-21 NOTE — Discharge Instructions (Signed)
Follow-up with your cardiologist.  Return if symptoms worsen as discussed. 

## 2021-09-28 ENCOUNTER — Ambulatory Visit (INDEPENDENT_AMBULATORY_CARE_PROVIDER_SITE_OTHER): Payer: Medicare Other | Admitting: Cardiology

## 2021-09-28 ENCOUNTER — Emergency Department (HOSPITAL_BASED_OUTPATIENT_CLINIC_OR_DEPARTMENT_OTHER): Payer: Medicare Other

## 2021-09-28 ENCOUNTER — Emergency Department (HOSPITAL_BASED_OUTPATIENT_CLINIC_OR_DEPARTMENT_OTHER)
Admission: EM | Admit: 2021-09-28 | Discharge: 2021-09-28 | Disposition: A | Discharge status: Home / Self Care | Payer: Medicare Other | Attending: Emergency Medicine | Admitting: Emergency Medicine

## 2021-09-28 ENCOUNTER — Encounter (HOSPITAL_BASED_OUTPATIENT_CLINIC_OR_DEPARTMENT_OTHER): Payer: Self-pay

## 2021-09-28 ENCOUNTER — Encounter: Payer: Self-pay | Admitting: Cardiology

## 2021-09-28 VITALS — BP 140/96 | HR 100 | Ht 60.0 in | Wt 112.4 lb

## 2021-09-28 DIAGNOSIS — I480 Paroxysmal atrial fibrillation: Secondary | ICD-10-CM | POA: Diagnosis not present

## 2021-09-28 DIAGNOSIS — I6930 Unspecified sequelae of cerebral infarction: Secondary | ICD-10-CM

## 2021-09-28 DIAGNOSIS — I10 Essential (primary) hypertension: Secondary | ICD-10-CM

## 2021-09-28 DIAGNOSIS — E785 Hyperlipidemia, unspecified: Secondary | ICD-10-CM | POA: Diagnosis not present

## 2021-09-28 DIAGNOSIS — R0609 Other forms of dyspnea: Secondary | ICD-10-CM

## 2021-09-28 DIAGNOSIS — I4891 Unspecified atrial fibrillation: Secondary | ICD-10-CM | POA: Insufficient documentation

## 2021-09-28 DIAGNOSIS — Z7901 Long term (current) use of anticoagulants: Secondary | ICD-10-CM | POA: Diagnosis not present

## 2021-09-28 DIAGNOSIS — R002 Palpitations: Secondary | ICD-10-CM | POA: Diagnosis present

## 2021-09-28 LAB — BASIC METABOLIC PANEL
Anion gap: 8 (ref 5–15)
BUN: 20 mg/dL (ref 8–23)
CO2: 26 mmol/L (ref 22–32)
Calcium: 9.5 mg/dL (ref 8.9–10.3)
Chloride: 106 mmol/L (ref 98–111)
Creatinine, Ser: 0.96 mg/dL (ref 0.44–1.00)
GFR, Estimated: 54 mL/min — ABNORMAL LOW (ref >60)
Glucose, Bld: 104 mg/dL — ABNORMAL HIGH (ref 70–99)
Potassium: 4.1 mmol/L (ref 3.5–5.1)
Sodium: 140 mmol/L (ref 135–145)

## 2021-09-28 LAB — CBC
HCT: 44.8 % (ref 36.0–46.0)
Hemoglobin: 14.8 g/dL (ref 12.0–15.0)
MCH: 30.6 pg (ref 26.0–34.0)
MCHC: 33 g/dL (ref 30.0–36.0)
MCV: 92.6 fL (ref 80.0–100.0)
Platelets: 139 10*3/uL — ABNORMAL LOW (ref 150–400)
RBC: 4.84 MIL/uL (ref 3.87–5.11)
RDW: 13.8 % (ref 11.5–15.5)
WBC: 4.8 10*3/uL (ref 4.0–10.5)
nRBC: 0 % (ref 0.0–0.2)

## 2021-09-28 LAB — MAGNESIUM: Magnesium: 2.1 mg/dL (ref 1.7–2.4)

## 2021-09-28 LAB — TROPONIN I (HIGH SENSITIVITY)
Troponin I (High Sensitivity): 9 ng/L (ref <18)
Troponin I (High Sensitivity): 12 ng/L (ref <18)

## 2021-09-28 MED ORDER — DILTIAZEM HCL 25 MG/5ML IV SOLN
10.0000 mg | Freq: Once | INTRAVENOUS | Status: AC
  Administered 2021-09-28: 10 mg via INTRAVENOUS
  Filled 2021-09-28: qty 5

## 2021-09-28 MED ORDER — DILTIAZEM HCL 25 MG/5ML IV SOLN
15.0000 mg | Freq: Once | INTRAVENOUS | Status: AC
  Administered 2021-09-28: 15 mg via INTRAVENOUS
  Filled 2021-09-28: qty 5

## 2021-09-28 MED ORDER — FUROSEMIDE 20 MG PO TABS
20.0000 mg | ORAL_TABLET | Freq: Every day | ORAL | 0 refills | Status: DC
Start: 2021-09-29 — End: 2021-10-14

## 2021-09-28 MED ORDER — DILTIAZEM HCL ER COATED BEADS 120 MG PO CP24: 120.0000 mg | ORAL_CAPSULE | Freq: Every day | ORAL | 0 refills | Status: DC

## 2021-09-28 MED ORDER — FUROSEMIDE 10 MG/ML IJ SOLN
40.0000 mg | Freq: Once | INTRAMUSCULAR | Status: AC
  Administered 2021-09-28: 40 mg via INTRAVENOUS
  Filled 2021-09-28: qty 4

## 2021-09-28 NOTE — Discharge Instructions (Signed)
Please call to schedule follow-up appointment either with your cardiologist or your primary care clinic by the end of the week.  They should recheck of your breathing and how your heart rate looks.  You were started on a medicine called diltiazem for rapid heart rate.  This is a calcium channel blocker.  He will take it once a day as prescribed, beginning tonight when you get home.  Your doctor's office may wish to change the dosing or frequency of this medicine, and so it is important to follow-up as an outpatient.  You are also given 2 more days of Lasix, which is a diuretic.  You will take this beginning tomorrow morning.  This medicine help you pee some of the fluid out from your lungs.  This should help with your breathing at home.  If you have worsening difficulty breathing, lightheadedness, feel like passing out, chest pain or pressure, or any other new emergency concerns, please call 911 and come back to the ER immediately.

## 2021-09-28 NOTE — ED Provider Notes (Signed)
[facility] EMERGENCY DEPARTMENT Provider Note   CSN: [id number] Arrival date & time: 09/28/21  1132     History  Chief Complaint  Patient presents with   Palpitations    Stephanie Foster is a 88 y.o. female with history paroxysmal A-fib on Eliquis presenting from the cardiologist office today with concern for A-fib with RVR.  Patient reports her heartbeats been running fast since last night.  She says she felt restless and having a hard time breathing or catching her breath when she laid down at night..  Her cardiologist referred her back down to the ER and also came down and personally spoke to me, recommending to the patient have rate control, as Cardizem would be a good choice.  She is not on rate controlling medicine at home.  HPI     Home Medications Prior to Admission medications   Medication Sig Start Date End Date Taking? Authorizing Provider  diltiazem (CARDIZEM CD) 120 MG 24 hr capsule Take 1 capsule (120 mg total) by mouth daily. 09/28/21 10/28/21 Yes Joniyah Mallinger, Kermit Balo, MD  furosemide (LASIX) 20 MG tablet Take 1 tablet (20 mg total) by mouth daily for 2 days. Take in the morning 09/29/21 10/01/21 Yes Laurisa Sahakian, Kermit Balo, MD  atorvastatin (LIPITOR) 20 MG tablet Take 1 tablet (20 mg total) by mouth daily. 02/24/20 04/20/21  Rehman, Areeg N, DO  Cranberry-Vitamin C-Vitamin E 4200-20-3 MG-MG-UNIT CAPS Take 1 tablet by mouth daily.    [provider]  ELIQUIS 5 MG TABS tablet TAKE 1 TABLET(5 MG) BY MOUTH TWICE DAILY Patient taking differently: Take 5 mg by mouth 2 (two) times daily. 04/17/21   [name], MD  Ferrous Fumarate (HEMOCYTE - 106 MG FE) 324 (106 Fe) MG TABS tablet Take 324 mg of iron by mouth daily with breakfast.    [provider]      Allergies    Ciprofloxacin, Aspartame and phenylalanine, Codeine, Diovan [valsartan], Flagyl [metronidazole], Macrobid [nitrofurantoin macrocrystal], Phenylalanine, Prednisone, Sulfa antibiotics, Vitasana  [actical], and Aspirin    Review of Systems   Review of Systems  Physical Exam Updated Vital Signs BP (!) 139/90   Pulse 84   Temp 98.4 F (36.9 C) (Oral)   Resp (!) 22   Ht 5' (1.524 m)   SpO2 95%   BMI 21.95 kg/m  Physical Exam Constitutional:      General: She is not in acute distress. HENT:     Head: Normocephalic and atraumatic.  Eyes:     Conjunctiva/sclera: Conjunctivae normal.     Pupils: Pupils are equal, round, and reactive to light.  Cardiovascular:     Rate and Rhythm: Tachycardia present.  Pulmonary:     Effort: Pulmonary effort is normal. No respiratory distress.  Abdominal:     General: There is no distension.     Tenderness: There is no abdominal tenderness.  Skin:    General: Skin is warm and dry.  Neurological:     General: No focal deficit present.     Mental Status: She is alert. Mental status is at baseline.  Psychiatric:        Mood and Affect: Mood normal.        Behavior: Behavior normal.     ED Results / Procedures / Treatments   Labs (all labs ordered are listed, but only abnormal results are displayed) Labs Reviewed  CBC - Abnormal; Notable for the following components:      Result Value   Platelets 139 (*)  All other components within normal limits  BASIC METABOLIC PANEL - Abnormal; Notable for the following components:   Glucose, Bld 104 (*)    GFR, Estimated 54 (*)    All other components within normal limits  MAGNESIUM  TROPONIN I (HIGH SENSITIVITY)  TROPONIN I (HIGH SENSITIVITY)    EKG EKG Interpretation  Date/Time:  Monday September 28 2021 11:50:41 EDT Ventricular Rate:  126 PR Interval:    QRS Duration: 76 QT Interval:  266 QTC Calculation: 385 R Axis:   -54 Text Interpretation: Atrial fibrillation with rapid ventricular response with premature ventricular or aberrantly conducted complexes Left axis deviation Low voltage QRS Cannot rule out Anterior infarct , age undetermined Abnormal ECG When compared with ECG of  21-May-2021 18:19, PREVIOUS ECG IS PRESENT Confirmed by Alvester Chou [phone]) on 09/28/2021 2:09:39 PM  Radiology DG Chest 2 View  Result Date: 09/28/2021 CLINICAL DATA:  Palpitations, shortness of breath EXAM: CHEST - 2 VIEW COMPARISON:  05/21/2021 FINDINGS: Cardiomegaly. Large hiatal hernia. Mild, diffuse bilateral interstitial pulmonary opacity. The visualized skeletal structures are unremarkable. IMPRESSION: 1. Cardiomegaly with mild, diffuse bilateral interstitial pulmonary opacity, likely edema. No focal airspace opacity. 2. Large hiatal hernia. Electronically Signed   By: Jearld Lesch M.D.   On: 09/28/2021 12:21    Procedures .Critical Care  Performed by: Terald Sleeper, MD Authorized by: Terald Sleeper, MD   Critical care provider statement:    Critical care time (minutes):  45   Critical care time was exclusive of:  Separately billable procedures and treating other patients   Critical care was necessary to treat or prevent imminent or life-threatening deterioration of the following conditions:  Circulatory failure   Critical care was time spent personally by me on the following activities:  Ordering and performing treatments and interventions, ordering and review of laboratory studies, ordering and review of radiographic studies, pulse oximetry, review of old charts, examination of patient and evaluation of patient's response to treatment Comments:     A-fib with RVR rate control with IV medication, telemetry monitoring.     Medications Ordered in ED Medications  diltiazem (CARDIZEM) injection 15 mg (15 mg Intravenous Given 09/28/21 1437)  furosemide (LASIX) injection 40 mg (40 mg Intravenous Given 09/28/21 1441)    ED Course/ Medical Decision Making/ A&P Clinical Course as of 09/28/21 1519  Mon Sep 28, 2021  1516 Patient is now rate controlled after diltiazem bolus, heart rate is fluctuating between 90-105 bpm, still in A-fib.  She is urinating and diuresing well.  I  think is reasonable to discharge her with diltiazem extended release, which she can take at night tonight, and have her follow-up with either PCP or cardiologist again by the end of the week.  We will also do 2 more days of diuretic with Lasix in the morning.  Her family was present at the bedside along with the patient and all are in agreement with the plan.  Hospitalization was considered, but given her normal lab work, her well clinical appearance, no hypoxia, I do think it is reasonable to try outpatient management instead, and she would prefer to avoid hospitalization. [MT]    Clinical Course User Index [MT] Osha Errico, Kermit Balo, MD                           Medical Decision Making Amount and/or Complexity of Data Reviewed Labs: ordered. Radiology: ordered.  Risk Prescription drug management.   Patient is here  with A-fib with RVR with a history of paroxysmal A-fib, already anticoagulated appropriately.  We will attempt rate control as per her cardiologist recommendation, can try IV diltiazem as a bolus initially.    She is otherwise not hypoxic, clinically well-appearing.  I personally reviewed and interpreted her labs, notable for no significant findings.  X-ray does show evidence of some cardiomegaly and some pulmonary edema, her oxygen levels 92%.  I suspect her may be some mild congestive heart failure component to her A-fib with RVR, I think she would benefit for some brief diuresis, we can try 40 mg IV Lasix in the ED.  The patient's son and granddaughter are both present at bedside to provide supplemental history.  Supplement history also provided by the patient's cardiologist, whose office note I reviewed, external records reviewed.        Final Clinical Impression(s) / ED Diagnoses Final diagnoses:  Atrial fibrillation with RVR (HCC)    Rx / DC Orders ED Discharge Orders          Ordered    furosemide (LASIX) 20 MG tablet  Daily        09/28/21 1516    diltiazem  (CARDIZEM CD) 120 MG 24 hr capsule  Daily        09/28/21 1516              Terald Sleeper, MD 09/28/21 [phone]

## 2021-09-28 NOTE — ED Triage Notes (Signed)
C/o shortness of breath, nausea & palpitations since yesterday. Hx of a.fib, takes eliquis.  A.fib RVR on monitor.

## 2021-09-28 NOTE — Progress Notes (Signed)
Cardiology Office Note:    Date:  09/28/2021   ID:  Stephanie Foster, DOB January 12, 1925, MRN [id number]  PCP:  Stephanie Cleverly, PA  Cardiologist:  Stephanie Balsam, MD    Referring MD: Stephanie Foster, [place]   No chief complaint on file. My heart is beating fast since morning  History of Present Illness:    Stephanie Foster is a 88 y.o. female with past medical history significant for paroxysmal atrial fibrillation, she is anticoagulated with Eliquis 5 mg twice daily, echocardiogram done in January 2022 showed preserved ejection fraction with moderately dilated left atrium mild mitral valve regurgitation, also history of essential hypertension dyslipidemia history of CVA. She came to my office for regular follow-up.  She is tachycardic.  She woke up in the morning she was so anxious about coming to my office and she feels her heart speeding up.  She does have a chest pain there is no shortness of breath no dizziness no passing out.  Past Medical History:  Diagnosis Date   Cataract    Chest pain 03/24/2012   Diaphoresis 03/24/2012   Diverticulosis    Dry eye syndrome of both lacrimal glands 11/16/2017   Dyslipidemia 02/28/2020   Dyspnea on exertion 02/28/2020   Essential hypertension 02/28/2020   GERD (gastroesophageal reflux disease)    H/O hiatal hernia    Hernia    Hypertension    Late effect of cerebrovascular accident (CVA) 02/28/2020   Nausea 03/24/2012   Nonexudative age-related macular degeneration, bilateral, early dry stage 11/16/2017   Pseudophakia of both eyes 11/16/2017   Stroke (HCC) 02/22/2020    Past Surgical History:  Procedure Laterality Date   ABDOMINAL HYSTERECTOMY     BLADDER SUSPENSION     EYE SURGERY     TONSILLECTOMY      Current Medications: No outpatient medications have been marked as taking for the 09/28/21 encounter (Office Visit) with Stephanie Lea, MD.     Allergies:   Ciprofloxacin, Aspartame and phenylalanine, Codeine, Diovan [valsartan], Flagyl [metronidazole],  Macrobid [nitrofurantoin macrocrystal], Phenylalanine, Prednisone, Sulfa antibiotics, Vitasana [actical], and Aspirin   Social History   Socioeconomic History   Marital status: Widowed    Spouse name: Not on file   Number of children: Not on file   Years of education: Not on file   Highest education level: Not on file  Occupational History   Not on file  Tobacco Use   Smoking status: Never   Smokeless tobacco: Never  Substance and Sexual Activity   Alcohol use: No   Drug use: No   Sexual activity: Not on file  Other Topics Concern   Not on file  Social History Narrative   Not on file   Social Determinants of Health   Financial Resource Strain: Not on file  Food Insecurity: Not on file  Transportation Needs: Not on file  Physical Activity: Not on file  Stress: Not on file  Social Connections: Not on file     Family History: The patient's family history includes Coronary artery disease in her brother and father. ROS:   Please see the history of present illness.    All 14 point review of systems negative except as described per history of present illness  EKGs/Labs/Other Studies Reviewed:      Recent Labs: 01/12/2021: TSH 3.190 05/21/2021: ALT 14; BUN 24; Creatinine, Ser 0.95; Hemoglobin 14.6; Platelets 141; Potassium 3.7; Sodium 141  Recent Lipid Panel    Component Value Date/Time   CHOL  179 02/24/2020 0154   TRIG 51 02/24/2020 0154   HDL 70 02/24/2020 0154   CHOLHDL 2.6 02/24/2020 0154   VLDL 10 02/24/2020 0154   LDLCALC 99 02/24/2020 0154    Physical Exam:    VS:  BP (!) 140/96 (BP Location: Left Arm, Patient Position: Sitting)   Pulse 100   Ht 5' (1.524 m)   Wt 112 lb 6.4 oz (51 kg)   SpO2 97%   BMI 21.95 kg/m     Wt Readings from Last 3 Encounters:  09/28/21 112 lb 6.4 oz (51 kg)  05/21/21 144 lb (65.3 kg)  04/20/21 149 lb (67.6 kg)     GEN:  Well nourished, well developed in no acute distress HEENT: Normal NECK: No JVD; No carotid  bruits LYMPHATICS: No lymphadenopathy CARDIAC: Irregular, tachycardic no murmurs, no rubs, no gallops RESPIRATORY:  Clear to auscultation without rales, wheezing or rhonchi  ABDOMEN: Soft, non-tender, non-distended MUSCULOSKELETAL:  No edema; No deformity  SKIN: Warm and dry LOWER EXTREMITIES: no swelling NEUROLOGIC:  Alert and oriented x 3 PSYCHIATRIC:  Normal affect   ASSESSMENT:    1. Paroxysmal atrial fibrillation (HCC)   2. Essential hypertension   3. Late effect of cerebrovascular accident (CVA)   4. Dyslipidemia   5. Dyspnea on exertion    PLAN:    In order of problems listed above:  Paroxysmal atrial fibrillation.  She is in atrial fibrillation today fast ventricular rate likely her blood pressure is holding.  She does not have any symptoms except palpitations but because of her advanced age I think she needs to have emergency room visit.  She will be transferred to our emergency room.  We will need to slow her down.  Cardizem probably will be a good choice.  If her ventricular rate is controlled I think she can be discharged home and manage as an outpatient.  If she continue with atrial fibrillation for longer time then will consider cardioversion. Essential hypertension blood pressure well controlled continue present management. Dyslipidemia, I do have her fasting lipid profile which only her LDL 99 HDL 70.  She is on Lipitor 20.  Which I will continue.  Disposition is patient will be brought to the emergency room for management.  At least we need to control ventricular rate.  And then we can take care of her rhythm strategy as an outpatient.   Medication Adjustments/Labs and Tests Ordered: Current medicines are reviewed at length with the patient today.  Concerns regarding medicines are outlined above.  No orders of the defined types were placed in this encounter.  Medication changes: No orders of the defined types were placed in this encounter.   Signed, Stephanie Lea, MD, [facility] 09/28/2021 11:10 AM    [facility]

## 2021-09-28 NOTE — ED Notes (Signed)
Light green lab tube recollected and submitted to lab, per lab request. 

## 2021-09-28 NOTE — ED Notes (Signed)
Pt placed on purewick 

## 2021-10-01 ENCOUNTER — Encounter: Payer: Self-pay | Admitting: Cardiology

## 2021-10-01 ENCOUNTER — Ambulatory Visit (INDEPENDENT_AMBULATORY_CARE_PROVIDER_SITE_OTHER): Payer: Medicare Other | Admitting: Cardiology

## 2021-10-01 VITALS — BP 124/60 | HR 116 | Ht 60.0 in | Wt 145.1 lb

## 2021-10-01 DIAGNOSIS — I10 Essential (primary) hypertension: Secondary | ICD-10-CM | POA: Diagnosis not present

## 2021-10-01 DIAGNOSIS — I4819 Other persistent atrial fibrillation: Secondary | ICD-10-CM | POA: Insufficient documentation

## 2021-10-01 DIAGNOSIS — I6930 Unspecified sequelae of cerebral infarction: Secondary | ICD-10-CM | POA: Diagnosis not present

## 2021-10-01 DIAGNOSIS — E785 Hyperlipidemia, unspecified: Secondary | ICD-10-CM

## 2021-10-01 MED ORDER — AMIODARONE HCL 200 MG PO TABS: 200.0000 mg | ORAL_TABLET | Freq: Every day | ORAL | 3 refills | Status: DC

## 2021-10-01 NOTE — Progress Notes (Signed)
Cardiology Office Note:    Date:  10/01/2021   ID:  Stephanie Foster, DOB Aug 24, 1924, MRN [id number]  PCP:  Shellia Cleverly, PA  Cardiologist:  Gypsy Balsam, MD    Referring MD: Shellia Cleverly, [place]   No chief complaint on file.   History of Present Illness:    Stephanie Foster is a 88 y.o. female with past medical history significant for paroxysmal atrial fibrillation, she is anticoagulated Eliquis 5 mg twice daily, also history of CVA, dyslipidemia, essential hypertension.  She presented to my office few days ago for regular follow-up she was found to be in atrial fibrillation with fast ventricular rate she was transported to the emergency room, Cardizem has been even she was discharged home.  She comes today to my office for follow-up.  Overall doing well like always denies have any chest pain tightness squeezing pressure burning chest.  Still heart rate is very regular she was given Cardizem CD120 with better control of the heart rate.  Past Medical History:  Diagnosis Date   Cataract    Chest pain 03/24/2012   Diaphoresis 03/24/2012   Diverticulosis    Dry eye syndrome of both lacrimal glands 11/16/2017   Dyslipidemia 02/28/2020   Dyspnea on exertion 02/28/2020   Essential hypertension 02/28/2020   GERD (gastroesophageal reflux disease)    H/O hiatal hernia    Hernia    Hypertension    Late effect of cerebrovascular accident (CVA) 02/28/2020   Nausea 03/24/2012   Nonexudative age-related macular degeneration, bilateral, early dry stage 11/16/2017   Pseudophakia of both eyes 11/16/2017   Stroke (HCC) 02/22/2020    Past Surgical History:  Procedure Laterality Date   ABDOMINAL HYSTERECTOMY     BLADDER SUSPENSION     EYE SURGERY     TONSILLECTOMY      Current Medications: Current Meds  Medication Sig   atorvastatin (LIPITOR) 20 MG tablet Take 1 tablet (20 mg total) by mouth daily.   Cranberry-Vitamin C-Vitamin E 4200-20-3 MG-MG-UNIT CAPS Take 1 tablet by mouth daily.   diltiazem (CARDIZEM  CD) 120 MG 24 hr capsule Take 1 capsule (120 mg total) by mouth daily.   ELIQUIS 5 MG TABS tablet TAKE 1 TABLET(5 MG) BY MOUTH TWICE DAILY (Patient taking differently: Take 5 mg by mouth 2 (two) times daily.)   Ferrous Fumarate (HEMOCYTE - 106 MG FE) 324 (106 Fe) MG TABS tablet Take 324 mg of iron by mouth daily with breakfast.     Allergies:   Ciprofloxacin, Aspartame and phenylalanine, Codeine, Diovan [valsartan], Flagyl [metronidazole], Macrobid [nitrofurantoin macrocrystal], Phenylalanine, Prednisone, Sulfa antibiotics, Vitasana [actical], and Aspirin   Social History   Socioeconomic History   Marital status: Widowed    Spouse name: Not on file   Number of children: Not on file   Years of education: Not on file   Highest education level: Not on file  Occupational History   Not on file  Tobacco Use   Smoking status: Never   Smokeless tobacco: Never  Substance and Sexual Activity   Alcohol use: No   Drug use: No   Sexual activity: Not on file  Other Topics Concern   Not on file  Social History Narrative   Not on file   Social Determinants of Health   Financial Resource Strain: Not on file  Food Insecurity: Not on file  Transportation Needs: Not on file  Physical Activity: Not on file  Stress: Not on file  Social Connections: Not on file  Family History: The patient's family history includes Coronary artery disease in her brother and father. ROS:   Please see the history of present illness.    All 14 point review of systems negative except as described per history of present illness  EKGs/Labs/Other Studies Reviewed:      Recent Labs: 01/12/2021: TSH 3.190 05/21/2021: ALT 14 09/28/2021: BUN 20; Creatinine, Ser 0.96; Hemoglobin 14.8; Magnesium 2.1; Platelets 139; Potassium 4.1; Sodium 140  Recent Lipid Panel    Component Value Date/Time   CHOL 179 02/24/2020 0154   TRIG 51 02/24/2020 0154   HDL 70 02/24/2020 0154   CHOLHDL 2.6 02/24/2020 0154   VLDL 10  02/24/2020 0154   LDLCALC 99 02/24/2020 0154    Physical Exam:    VS:  BP 124/60 (BP Location: Right Arm, Patient Position: Sitting)   Pulse (!) 116   Ht 5' (1.524 m)   Wt 145 lb 1.9 oz (65.8 kg)   SpO2 96%   BMI 28.34 kg/m     Wt Readings from Last 3 Encounters:  10/01/21 145 lb 1.9 oz (65.8 kg)  09/28/21 112 lb 6.4 oz (51 kg)  05/21/21 144 lb (65.3 kg)     GEN:  Well nourished, well developed in no acute distress HEENT: Normal NECK: No JVD; No carotid bruits LYMPHATICS: No lymphadenopathy CARDIAC: Irregularly irregular, no murmurs, no rubs, no gallops RESPIRATORY:  Clear to auscultation without rales, wheezing or rhonchi  ABDOMEN: Soft, non-tender, non-distended MUSCULOSKELETAL:  No edema; No deformity  SKIN: Warm and dry LOWER EXTREMITIES: no swelling NEUROLOGIC:  Alert and oriented x 3 PSYCHIATRIC:  Normal affect   ASSESSMENT:    1. Essential hypertension   2. Persistent atrial fibrillation (HCC)   3. Dyslipidemia   4. Late effect of cerebrovascular accident (CVA)    PLAN:    In order of problems listed above:  Paroxysmal atrial fibrillation now persistent.  Will ask her to start taking amiodarone but because of her at the onset age I will go very carefully.  So we will start with amiodarone 200 mg daily I will bring her back to my office in about 10 days to see EKG if EKG still showing atrial fibrillation then will repeat increase the dose of amiodarone to 200 mg twice daily and I will see her back in my office in about 6 weeks if she is still in atrial fibrillation then will consider cardioversion however she is very reluctant to consider that option Dyslipidemia I did review her K PN which show me her LDL of 99 HDL 70.  That is good for her situation she is 88 years old she is already on Lipitor 20 which I will continue Late effect of CVA does not have any new episodes   Medication Adjustments/Labs and Tests Ordered: Current medicines are reviewed at length  with the patient today.  Concerns regarding medicines are outlined above.  No orders of the defined types were placed in this encounter.  Medication changes: No orders of the defined types were placed in this encounter.   Signed, [name], MD, [facility] 10/01/2021 11:37 AM    [facility]

## 2021-10-01 NOTE — Patient Instructions (Addendum)
Medication Instructions:  Your physician has recommended you make the following change in your medication:   START:Amiodarone 200mg  1 tablet daily by mouth    Lab Work: None Ordered If you have labs (blood work) drawn today and your tests are completely normal, you will receive your results only by: MyChart Message (if you have MyChart) OR A paper copy in the mail If you have any lab test that is abnormal or we need to change your treatment, we will call you to review the results.   Testing/Procedures: None Ordered   Follow-Up: At [facility], you and your health needs are our priority.  As part of our continuing mission to provide you with exceptional heart care, we have created designated Provider Care Teams.  These Care Teams include your primary Cardiologist (physician) and Advanced Practice Providers (APPs -  Physician Assistants and Nurse Practitioners) who all work together to provide you with the care you need, when you need it.  We recommend signing up for the patient portal called "MyChart".  Sign up information is provided on this After Visit Summary.  MyChart is used to connect with patients for Virtual Visits (Telemedicine).  Patients are able to view lab/test results, encounter notes, upcoming appointments, etc.  Non-urgent messages can be sent to your provider as well.   To learn more about what you can do with MyChart, go to [facility].    Your next appointment:   2 month(s)  The format for your next appointment:   In Person  Provider:   [email or web address], MD    Other Instructions NURSE VISIT- 10 days [place] Office for EKG- Amiodarone 200mg  daily

## 2021-10-07 DIAGNOSIS — N183 Chronic kidney disease, stage 3 unspecified: Secondary | ICD-10-CM | POA: Insufficient documentation

## 2021-10-14 ENCOUNTER — Ambulatory Visit: Payer: Medicare Other | Attending: Cardiology

## 2021-10-14 VITALS — BP 138/84 | HR 112 | Ht 60.0 in | Wt 147.0 lb

## 2021-10-14 DIAGNOSIS — I4819 Other persistent atrial fibrillation: Secondary | ICD-10-CM

## 2021-10-20 ENCOUNTER — Telehealth: Payer: Self-pay | Admitting: Cardiology

## 2021-10-20 NOTE — Telephone Encounter (Signed)
Spoke with son about EKG. Dr. Bing Matter recommended that pt increase Amiodarone to 200mg  BID and come in on the 25th for another EKG.  Appt scheduled for Nurse visit. Son aware.

## 2021-10-20 NOTE — Progress Notes (Signed)
   Nurse Visit   Date of Encounter: 10/20/2021 ID: Stephanie Foster, DOB [birth year], MRN [id number]  PCP:  Shellia Cleverly, PA   [facility] Providers Cardiologist:  None      Visit Details   VS:  BP 138/84   Pulse (!) 112   Ht 5' (1.524 m)   Wt 147 lb 0.6 oz (66.7 kg)   SpO2 94%   BMI 28.72 kg/m  , BMI Body mass index is 28.72 kg/m.  Wt Readings from Last 3 Encounters:  10/14/21 147 lb 0.6 oz (66.7 kg)  10/01/21 145 lb 1.9 oz (65.8 kg)  09/28/21 112 lb 6.4 oz (51 kg)     Reason for visit: EKG - Amiodarone management Performed today: Vitals, EKG, Provider consulted:Dr. Bing Matter , and Education Changes (medications, testing, etc.) : No changes in medications Length of Visit: 15 minutes    Medications Adjustments/Labs and Tests Ordered: Orders Placed This Encounter  Procedures   EKG 12-Lead   No orders of the defined types were placed in this encounter.    Signed, Neena Rhymes, RN  10/20/2021 8:37 AM

## 2021-10-20 NOTE — Telephone Encounter (Signed)
Pt's son returning call regarding EKG results. Please advise

## 2021-11-02 ENCOUNTER — Telehealth: Payer: Self-pay | Admitting: Cardiology

## 2021-11-02 MED ORDER — DILTIAZEM HCL ER COATED BEADS 120 MG PO CP24: 120.0000 mg | ORAL_CAPSULE | Freq: Every day | ORAL | 2 refills | Status: DC

## 2021-11-02 NOTE — Telephone Encounter (Signed)
*  STAT* If patient is at the pharmacy, call can be transferred to refill team.   1. Which medications need to be refilled? (please list name of each medication and dose if known)  diltiazem (CARDIZEM CD) 120 MG 24 hr capsule (Expired)  2. Which pharmacy/location (including street and city if local pharmacy) is medication to be sent to? [id number] - [place], [place] - [address] AT [address]  3. Do they need a 30 day or 90 day supply? 90 day supply   Patient has been out of medication since 09/12.

## 2021-11-02 NOTE — Telephone Encounter (Signed)
Refill of Diltiazem 120 mg sent to [facility].

## 2021-11-09 ENCOUNTER — Ambulatory Visit: Payer: Medicare Other | Attending: Cardiology | Admitting: *Deleted

## 2021-11-09 VITALS — HR 104 | Wt 150.0 lb

## 2021-11-09 DIAGNOSIS — Z79899 Other long term (current) drug therapy: Secondary | ICD-10-CM | POA: Diagnosis present

## 2021-11-09 DIAGNOSIS — I4819 Other persistent atrial fibrillation: Secondary | ICD-10-CM | POA: Insufficient documentation

## 2021-11-09 NOTE — Progress Notes (Addendum)
   Nurse Visit   Date of Encounter: 11/09/2021 ID: Stephanie Foster, DOB 1924/08/13, MRN [id number]  PCP:  Manfred Shirts, PA   [place] Providers Cardiologist: Agustin Cree  Visit Details   VS:  Pulse (!) 104   Wt 150 lb (68 kg)   BMI 29.29 kg/m  , BMI Body mass index is 29.29 kg/m.  Wt Readings from Last 3 Encounters:  11/09/21 150 lb (68 kg)  10/14/21 147 lb 0.6 oz (66.7 kg)  10/01/21 145 lb 1.9 oz (65.8 kg)     Reason for visit: EKG post Amiodarone increase  Performed today: Vitals, EKG, Provider consulted: , and Education Changes (medications, testing, etc.) : n/a Length of Visit: 15 minutes  -- Pt came in today for follow up EKG post Amiodarone increase to BID.  Pt still in AFib, HRs 90s-100s.   -- Pt experiencing BLEE, 1+ pitting -- Pt does reports nausea & dizzy on increased dose, but seem to be tolerating it better now. -- Dr. Curt Bears reviewed EKG, spoke to Dr. Agustin Cree. -- Pt scheduled to see Dr Agustin Cree tomorrow in [place] to further discuss plan. -- Pt advised to take BP this evening and tomorrow morning and take to appt tomorrow.  Medications Adjustments/Labs and Tests Ordered: No orders of the defined types were placed in this encounter.  No orders of the defined types were placed in this encounter.    Signed, Stanton Kidney, RN  11/09/2021 2:16 PM

## 2021-11-10 ENCOUNTER — Encounter: Payer: Self-pay | Admitting: Cardiology

## 2021-11-10 ENCOUNTER — Ambulatory Visit: Payer: Medicare Other | Attending: Cardiology | Admitting: Cardiology

## 2021-11-10 VITALS — BP 136/78 | HR 102 | Ht 60.0 in | Wt 149.2 lb

## 2021-11-10 DIAGNOSIS — R0609 Other forms of dyspnea: Secondary | ICD-10-CM | POA: Insufficient documentation

## 2021-11-10 DIAGNOSIS — I6930 Unspecified sequelae of cerebral infarction: Secondary | ICD-10-CM | POA: Diagnosis present

## 2021-11-10 DIAGNOSIS — I10 Essential (primary) hypertension: Secondary | ICD-10-CM | POA: Insufficient documentation

## 2021-11-10 DIAGNOSIS — I4819 Other persistent atrial fibrillation: Secondary | ICD-10-CM | POA: Diagnosis present

## 2021-11-10 MED ORDER — DILTIAZEM HCL ER COATED BEADS 120 MG PO CP24: 120.0000 mg | ORAL_CAPSULE | Freq: Two times a day (BID) | ORAL | 2 refills | Status: DC

## 2021-11-10 NOTE — Progress Notes (Unsigned)
Cardiology Office Note:    Date:  11/10/2021   ID:  Stephanie Foster, DOB 1924/03/16, MRN [id number]  PCP:  Manfred Shirts, PA  Cardiologist:  Jenne Campus, MD    Referring MD: Manfred Shirts, [place]   Chief Complaint  Patient presents with   Atrial Fibrillation       Doing fine  History of Present Illness:    Stephanie Foster is a 88 y.o. female with past medical history significant for paroxysmal atrial fibrillation now persistent atrial fibrillation, she is anticoagulated with Eliquis 5 mg twice daily which is appropriate she is more than 88 years old but weight more than 60 kg and her creatinine is normal, I am trying gradually to put her on appropriate medication being very gentle considering her age she has been on small dose of Cardizem as well as amiodarone only to her milligrams daily I increased recently Cardizem to 200 mg twice daily she does not feel well with that therefore we will change lipid strategy will continue with Cardizem 120 and increase amiodarone to two 1 mg twice daily.  Past Medical History:  Diagnosis Date   Cataract    Chest pain 03/24/2012   Diaphoresis 03/24/2012   Diverticulosis    Dry eye syndrome of both lacrimal glands 11/16/2017   Dyslipidemia 02/28/2020   Dyspnea on exertion 02/28/2020   Essential hypertension 02/28/2020   GERD (gastroesophageal reflux disease)    H/O hiatal hernia    Hernia    Hypertension    Late effect of cerebrovascular accident (CVA) 02/28/2020   Nausea 03/24/2012   Nonexudative age-related macular degeneration, bilateral, early dry stage 11/16/2017   Pseudophakia of both eyes 11/16/2017   Stroke ([place]) 02/22/2020    Past Surgical History:  Procedure Laterality Date   ABDOMINAL HYSTERECTOMY     BLADDER SUSPENSION     EYE SURGERY     TONSILLECTOMY      Current Medications: Current Meds  Medication Sig   amiodarone (PACERONE) 200 MG tablet Take 1 tablet (200 mg total) by mouth daily.   atorvastatin (LIPITOR) 20 MG tablet Take 20 mg  by mouth daily.   Cranberry-Vitamin C-Vitamin E 4200-20-3 MG-MG-UNIT CAPS Take 1 tablet by mouth 2 (two) times daily.   diltiazem (CARDIZEM CD) 120 MG 24 hr capsule Take 1 capsule (120 mg total) by mouth daily. (Patient taking differently: Take 240 mg by mouth daily.)   ELIQUIS 5 MG TABS tablet TAKE 1 TABLET(5 MG) BY MOUTH TWICE DAILY (Patient taking differently: Take 5 mg by mouth 2 (two) times daily.)   Ferrous Fumarate (HEMOCYTE - 106 MG FE) 324 (106 Fe) MG TABS tablet Take 324 mg of iron by mouth daily with breakfast.     Allergies:   Ciprofloxacin, Aspartame and phenylalanine, Codeine, Diovan [valsartan], Flagyl [metronidazole], Macrobid [nitrofurantoin macrocrystal], Phenylalanine, Prednisone, Sulfa antibiotics, Vitasana [actical], and Aspirin   Social History   Socioeconomic History   Marital status: Widowed    Spouse name: Not on file   Number of children: Not on file   Years of education: Not on file   Highest education level: Not on file  Occupational History   Not on file  Tobacco Use   Smoking status: Never   Smokeless tobacco: Never  Substance and Sexual Activity   Alcohol use: No   Drug use: No   Sexual activity: Not on file  Other Topics Concern   Not on file  Social History Narrative   Not on file  Social Determinants of Health   Financial Resource Strain: Not on file  Food Insecurity: Not on file  Transportation Needs: Not on file  Physical Activity: Not on file  Stress: Not on file  Social Connections: Not on file     Family History: The patient's family history includes Coronary artery disease in her brother and father. ROS:   Please see the history of present illness.    All 14 point review of systems negative except as described per history of present illness  EKGs/Labs/Other Studies Reviewed:      Recent Labs: 01/12/2021: TSH 3.190 05/21/2021: ALT 14 09/28/2021: BUN 20; Creatinine, Ser 0.96; Hemoglobin 14.8; Magnesium 2.1; Platelets 139;  Potassium 4.1; Sodium 140  Recent Lipid Panel    Component Value Date/Time   CHOL 179 02/24/2020 0154   TRIG 51 02/24/2020 0154   HDL 70 02/24/2020 0154   CHOLHDL 2.6 02/24/2020 0154   VLDL 10 02/24/2020 0154   LDLCALC 99 02/24/2020 0154    Physical Exam:    VS:  BP 136/78 (BP Location: Left Arm, Patient Position: Sitting)   Pulse (!) 102   Ht 5' (1.524 m)   Wt 149 lb 3.2 oz (67.7 kg)   SpO2 93%   BMI 29.14 kg/m     Wt Readings from Last 3 Encounters:  11/10/21 149 lb 3.2 oz (67.7 kg)  11/09/21 150 lb (68 kg)  10/14/21 147 lb 0.6 oz (66.7 kg)     GEN:  Well nourished, well developed in no acute distress HEENT: Normal NECK: No JVD; No carotid bruits LYMPHATICS: No lymphadenopathy CARDIAC: Irregular no murmurs, no rubs, no gallops RESPIRATORY:  Clear to auscultation without rales, wheezing or rhonchi  ABDOMEN: Soft, non-tender, non-distended MUSCULOSKELETAL:  No edema; No deformity  SKIN: Warm and dry LOWER EXTREMITIES: no swelling NEUROLOGIC:  Alert and oriented x 3 PSYCHIATRIC:  Normal affect   ASSESSMENT:    1. Persistent atrial fibrillation (HCC)   2. Primary hypertension   3. Essential hypertension   4. Late effect of cerebrovascular accident (CVA)   5. Dyspnea on exertion    PLAN:    In order of problems listed above:  Persistent atrial fibrillation plan as described above.  I talked to her today about potentially cardioversion she is 88 years old and she is only straight she absolutely does not want to therefore we will try to control her rhythm with amiodarone however if this fails we will concentrate on rate control strategy rather than rhythm control strategy. Essential hypertension blood pressure seems to be controlled continue present management. Lately of CVA, does not have anymore. Exertion mild   Medication Adjustments/Labs and Tests Ordered: Current medicines are reviewed at length with the patient today.  Concerns regarding medicines are  outlined above.  No orders of the defined types were placed in this encounter.  Medication changes: No orders of the defined types were placed in this encounter.   Signed, [name], MD, [facility] 11/10/2021 3:44 PM    [facility]

## 2021-11-10 NOTE — Patient Instructions (Addendum)
Medication Instructions:  Your physician has recommended you make the following change in your medication:   Decrease: Amiodarone to 200mg  1 daily  INCREASE: Diltiazem 120mg  to twice daily     Lab Work: None Ordered If you have labs (blood work) drawn today and your tests are completely normal, you will receive your results only by: [place] (if you have MyChart) OR A paper copy in the mail If you have any lab test that is abnormal or we need to change your treatment, we will call you to review the results.   Testing/Procedures: None Ordered   Follow-Up: At [facility], you and your health needs are our priority.  As part of our continuing mission to provide you with exceptional heart care, we have created designated Provider Care Teams.  These Care Teams include your primary Cardiologist (physician) and Advanced Practice Providers (APPs -  Physician Assistants and Nurse Practitioners) who all work together to provide you with the care you need, when you need it.  We recommend signing up for the patient portal called "MyChart".  Sign up information is provided on this After Visit Summary.  MyChart is used to connect with patients for Virtual Visits (Telemedicine).  Patients are able to view lab/test results, encounter notes, upcoming appointments, etc.  Non-urgent messages can be sent to your provider as well.   To learn more about what you can do with MyChart, go to [email or web address].    Your next appointment:   3 month(s)- 1st appt- December 5 in [facility]  The format for your next appointment:   In Person  Provider:   Jenne Campus, MD    Other Instructions NA

## 2021-12-15 ENCOUNTER — Ambulatory Visit: Payer: Medicare Other | Attending: Cardiology | Admitting: Cardiology

## 2021-12-15 ENCOUNTER — Encounter: Payer: Self-pay | Admitting: Cardiology

## 2021-12-15 VITALS — BP 110/60 | HR 48 | Ht 60.0 in | Wt 146.2 lb

## 2021-12-15 DIAGNOSIS — I6930 Unspecified sequelae of cerebral infarction: Secondary | ICD-10-CM

## 2021-12-15 DIAGNOSIS — I10 Essential (primary) hypertension: Secondary | ICD-10-CM

## 2021-12-15 DIAGNOSIS — I4819 Other persistent atrial fibrillation: Secondary | ICD-10-CM | POA: Diagnosis present

## 2021-12-15 DIAGNOSIS — R0609 Other forms of dyspnea: Secondary | ICD-10-CM

## 2021-12-15 NOTE — Progress Notes (Signed)
Cardiology Office Note:    Date:  12/15/2021   ID:  Stephanie Foster, DOB Apr 27, 1924, MRN [id number]  PCP:  Manfred Shirts, PA  Cardiologist:  Jenne Campus, MD    Referring MD: Manfred Shirts, [place]   Chief Complaint  Patient presents with   Medication Management    Dizziness, nausea, rash and swelling of the legs Unsure what meds might be causing this .     History of Present Illness:    Stephanie Foster is a 88 y.o. female with past medical history what appears to be permanent atrial fibrillation.  To be this high discussion about cardioversion she does not want to have it we having difficulty controlling her rate because of low blood pressure last time I put on a small dose of amiodarone however she does not tolerate this medication well she complained of having nausea and some swelling of lower extremities.  I suspect this is combination of amiodarone and calcium channel blocker.  Denies have any chest pain tightness squeezing pressure burning chest.  Past Medical History:  Diagnosis Date   Cataract    Chest pain 03/24/2012   Diaphoresis 03/24/2012   Diverticulosis    Dry eye syndrome of both lacrimal glands 11/16/2017   Dyslipidemia 02/28/2020   Dyspnea on exertion 02/28/2020   Essential hypertension 02/28/2020   GERD (gastroesophageal reflux disease)    H/O hiatal hernia    Hernia    Hypertension    Late effect of cerebrovascular accident (CVA) 02/28/2020   Nausea 03/24/2012   Nonexudative age-related macular degeneration, bilateral, early dry stage 11/16/2017   Pseudophakia of both eyes 11/16/2017   Stroke ([place]) 02/22/2020    Past Surgical History:  Procedure Laterality Date   ABDOMINAL HYSTERECTOMY     BLADDER SUSPENSION     EYE SURGERY     TONSILLECTOMY      Current Medications: Current Meds  Medication Sig   atorvastatin (LIPITOR) 20 MG tablet Take 20 mg by mouth daily.   Cranberry-Vitamin C-Vitamin E 4200-20-3 MG-MG-UNIT CAPS Take 1 tablet by mouth 2 (two) times daily.    diltiazem (CARDIZEM CD) 120 MG 24 hr capsule Take 1 capsule (120 mg total) by mouth 2 (two) times daily.   ELIQUIS 5 MG TABS tablet TAKE 1 TABLET(5 MG) BY MOUTH TWICE DAILY (Patient taking differently: Take 5 mg by mouth 2 (two) times daily.)   Ferrous Fumarate (HEMOCYTE - 106 MG FE) 324 (106 Fe) MG TABS tablet Take 324 mg of iron by mouth daily with breakfast.   [DISCONTINUED] amiodarone (PACERONE) 200 MG tablet Take 1 tablet (200 mg total) by mouth daily.     Allergies:   Ciprofloxacin, Aspartame and phenylalanine, Codeine, Diovan [valsartan], Flagyl [metronidazole], Macrobid [nitrofurantoin macrocrystal], Phenylalanine, Prednisone, Sulfa antibiotics, Vitasana [actical], and Aspirin   Social History   Socioeconomic History   Marital status: Widowed    Spouse name: Not on file   Number of children: Not on file   Years of education: Not on file   Highest education level: Not on file  Occupational History   Not on file  Tobacco Use   Smoking status: Never   Smokeless tobacco: Never  Substance and Sexual Activity   Alcohol use: No   Drug use: No   Sexual activity: Not on file  Other Topics Concern   Not on file  Social History Narrative   Not on file   Social Determinants of Health   Financial Resource Strain: Not on file  Food Insecurity: Not on file  Transportation Needs: Not on file  Physical Activity: Not on file  Stress: Not on file  Social Connections: Not on file     Family History: The patient's family history includes Coronary artery disease in her brother and father. ROS:   Please see the history of present illness.    All 14 point review of systems negative except as described per history of present illness  EKGs/Labs/Other Studies Reviewed:      Recent Labs: 01/12/2021: TSH 3.190 05/21/2021: ALT 14 09/28/2021: BUN 20; Creatinine, Ser 0.96; Hemoglobin 14.8; Magnesium 2.1; Platelets 139; Potassium 4.1; Sodium 140  Recent Lipid Panel    Component Value  Date/Time   CHOL 179 02/24/2020 0154   TRIG 51 02/24/2020 0154   HDL 70 02/24/2020 0154   CHOLHDL 2.6 02/24/2020 0154   VLDL 10 02/24/2020 0154   LDLCALC 99 02/24/2020 0154    Physical Exam:    VS:  BP 110/60 (BP Location: Left Arm, Patient Position: Sitting)   Pulse (!) 48   Ht 5' (1.524 m)   Wt 146 lb 3.2 oz (66.3 kg)   SpO2 97%   BMI 28.55 kg/m     Wt Readings from Last 3 Encounters:  12/15/21 146 lb 3.2 oz (66.3 kg)  11/10/21 149 lb 3.2 oz (67.7 kg)  11/09/21 150 lb (68 kg)     GEN:  Well nourished, well developed in no acute distress HEENT: Normal NECK: No JVD; No carotid bruits LYMPHATICS: No lymphadenopathy CARDIAC: RRR, no murmurs, no rubs, no gallops RESPIRATORY:  Clear to auscultation without rales, wheezing or rhonchi  ABDOMEN: Soft, non-tender, non-distended MUSCULOSKELETAL:  No edema; No deformity  SKIN: Warm and dry LOWER EXTREMITIES: no swelling NEUROLOGIC:  Alert and oriented x 3 PSYCHIATRIC:  Normal affect   ASSESSMENT:    1. Persistent atrial fibrillation ([place])   2. Essential hypertension   3. Dyspnea on exertion   4. Late effect of cerebrovascular accident (CVA)    PLAN:    In order of problems listed above:  Persistent atrial fibrillation probably permanent atrial fibrillation now since she does not want to get converted.  We will continue present management, I will discontinue her amiodarone we will check her complete metabolic panel today.  I will bring her back to the office in about 1 week to have EKG done and then I see her back in about a month. Essential hypertension blood pressure well controlled continue present management. Dyspnea on exertion stable. Late effect of CVA.  Doing well from that point review   Medication Adjustments/Labs and Tests Ordered: Current medicines are reviewed at length with the patient today.  Concerns regarding medicines are outlined above.  Orders Placed This Encounter  Procedures   Comprehensive  metabolic panel   TSH   EKG 12-Lead   Medication changes: No orders of the defined types were placed in this encounter.   Signed, Park Liter, MD, [facility] 12/15/2021 2:23 PM    [place]

## 2021-12-15 NOTE — Patient Instructions (Signed)
Medication Instructions:  Your physician has recommended you make the following change in your medication:   Stop Amiodarone  *If you need a refill on your cardiac medications before your next appointment, please call your pharmacy*   Lab Work: Your physician recommends that you need a CMP and TSH today in the office.  If you have labs (blood work) drawn today and your tests are completely normal, you will receive your results only by: [place] (if you have MyChart) OR A paper copy in the mail If you have any lab test that is abnormal or we need to change your treatment, we will call you to review the results.   Testing/Procedures: None ordered   Follow-Up: At [facility], you and your health needs are our priority.  As part of our continuing mission to provide you with exceptional heart care, we have created designated Provider Care Teams.  These Care Teams include your primary Cardiologist (physician) and Advanced Practice Providers (APPs -  Physician Assistants and Nurse Practitioners) who all work together to provide you with the care you need, when you need it.  We recommend signing up for the patient portal called "MyChart".  Sign up information is provided on this After Visit Summary.  MyChart is used to connect with patients for Virtual Visits (Telemedicine).  Patients are able to view lab/test results, encounter notes, upcoming appointments, etc.  Non-urgent messages can be sent to your provider as well.   To learn more about what you can do with MyChart, go to [email or web address].    Your next appointment:   1 month(s)  The format for your next appointment:   In Person  Provider:   Jenne Campus, MD   Other Instructions NA

## 2021-12-16 LAB — COMPREHENSIVE METABOLIC PANEL
ALT: 18 IU/L (ref 0–32)
AST: 19 IU/L (ref 0–40)
Albumin/Globulin Ratio: 1.8 (ref 1.2–2.2)
Albumin: 4.2 g/dL (ref 3.6–4.6)
Alkaline Phosphatase: 132 IU/L — ABNORMAL HIGH (ref 44–121)
BUN/Creatinine Ratio: 17 (ref 12–28)
BUN: 18 mg/dL (ref 10–36)
Bilirubin Total: 0.7 mg/dL (ref 0.0–1.2)
CO2: 24 mmol/L (ref 20–29)
Calcium: 9.1 mg/dL (ref 8.7–10.3)
Chloride: 103 mmol/L (ref 96–106)
Creatinine, Ser: 1.08 mg/dL — ABNORMAL HIGH (ref 0.57–1.00)
Globulin, Total: 2.3 g/dL (ref 1.5–4.5)
Glucose: 96 mg/dL (ref 70–99)
Potassium: 4.3 mmol/L (ref 3.5–5.2)
Sodium: 143 mmol/L (ref 134–144)
Total Protein: 6.5 g/dL (ref 6.0–8.5)
eGFR: 47 mL/min/{1.73_m2} — ABNORMAL LOW (ref >59)

## 2021-12-16 LAB — TSH: TSH: 9.65 u[IU]/mL — ABNORMAL HIGH (ref 0.450–4.500)

## 2021-12-23 ENCOUNTER — Ambulatory Visit: Payer: Medicare Other | Attending: Cardiology

## 2021-12-23 VITALS — BP 124/78 | HR 96 | Ht 60.0 in | Wt 144.1 lb

## 2021-12-23 DIAGNOSIS — I4819 Other persistent atrial fibrillation: Secondary | ICD-10-CM | POA: Diagnosis present

## 2021-12-23 NOTE — Progress Notes (Signed)
   Nurse Visit   Date of Encounter: 12/23/2021 ID: BRITTAINY BUCKER, DOB 12/16/1924, MRN [id number]  PCP:  Shellia Cleverly, PA    HeartCare Providers Cardiologist:  Bing Matter   Visit Details   VS:  BP 124/78   Pulse 96   Ht 5' (1.524 m)   Wt 144 lb 1.9 oz (65.4 kg)   SpO2 95%   BMI 28.15 kg/m  , BMI Body mass index is 28.15 kg/m.  Wt Readings from Last 3 Encounters:  12/23/21 144 lb 1.9 oz (65.4 kg)  12/15/21 146 lb 3.2 oz (66.3 kg)  11/10/21 149 lb 3.2 oz (67.7 kg)     Reason for visit: EKG Performed today: Vitals, EKG, Provider consulted:Krasowski, and Education Changes (medications, testing, etc.) : none Length of Visit: 15 minutes    Medications Adjustments/Labs and Tests Ordered: No orders of the defined types were placed in this encounter.  No orders of the defined types were placed in this encounter.    Signed, Eleonore Chiquito, RN  12/23/2021 11:20 AM

## 2022-01-19 ENCOUNTER — Encounter: Payer: Self-pay | Admitting: Cardiology

## 2022-01-19 ENCOUNTER — Ambulatory Visit: Payer: Medicare Other | Attending: Cardiology | Admitting: Cardiology

## 2022-01-19 VITALS — BP 136/64 | HR 78 | Resp 95 | Ht 60.0 in | Wt 147.0 lb

## 2022-01-19 DIAGNOSIS — I480 Paroxysmal atrial fibrillation: Secondary | ICD-10-CM | POA: Diagnosis present

## 2022-01-19 DIAGNOSIS — E785 Hyperlipidemia, unspecified: Secondary | ICD-10-CM

## 2022-01-19 DIAGNOSIS — I10 Essential (primary) hypertension: Secondary | ICD-10-CM | POA: Diagnosis present

## 2022-01-19 NOTE — Patient Instructions (Signed)
Medication Instructions:  Your physician recommends that you continue on your current medications as directed. Please refer to the Current Medication list given to you today.  *If you need a refill on your cardiac medications before your next appointment, please call your pharmacy*   Lab Work: None Ordered If you have labs (blood work) drawn today and your tests are completely normal, you will receive your results only by: MyChart [facility] (if you have MyChart) OR A paper copy in the mail If you have any lab test that is abnormal or we need to change your treatment, we will call you to review the results.   Testing/Procedures: None Ordered   Follow-Up: At [facility], you and your health needs are our priority.  As part of our continuing mission to provide you with exceptional heart care, we have created designated Provider Care Teams.  These Care Teams include your primary Cardiologist (physician) and Advanced Practice Providers (APPs -  Physician Assistants and Nurse Practitioners) who all work together to provide you with the care you need, when you need it.  We recommend signing up for the patient portal called "MyChart".  Sign up information is provided on this After Visit Summary.  MyChart is used to connect with patients for Virtual Visits (Telemedicine).  Patients are able to view lab/test results, encounter notes, upcoming appointments, etc.  Non-urgent messages can be sent to your provider as well.   To learn more about what you can do with MyChart, go to [email or web address].    Your next appointment:   4 month(s)  The format for your next appointment:   In Person  Provider:   Robert Krasowski, MD    Other Instructions NA  

## 2022-01-19 NOTE — Progress Notes (Signed)
Cardiology Office Note:    Date:  01/19/2022   ID:  Stephanie Foster, DOB [birth year], MRN [id number]  PCP:  Shellia Cleverly, PA  Cardiologist:  Gypsy Balsam, MD    Referring MD: Shellia Cleverly, [place]   Chief Complaint  Patient presents with   Hallucinations   Memory Loss    History of Present Illness:    Stephanie Foster is a 88 y.o. female with past medical history significant for permanent atrial fibrillation, essential hypertension, GERD, history of CVA.  She comes today 2 months for follow-up.  Overall doing well denies have any chest pain tightness squeezing pressure burning chest.  After I discontinue her amiodarone she feels significantly better.  However she complained of having some hallucinations.  Past Medical History:  Diagnosis Date   Cataract    Chest pain 03/24/2012   Diaphoresis 03/24/2012   Diverticulosis    Dry eye syndrome of both lacrimal glands 11/16/2017   Dyslipidemia 02/28/2020   Dyspnea on exertion 02/28/2020   Essential hypertension 02/28/2020   GERD (gastroesophageal reflux disease)    H/O hiatal hernia    Hernia    Hypertension    Late effect of cerebrovascular accident (CVA) 02/28/2020   Nausea 03/24/2012   Nonexudative age-related macular degeneration, bilateral, early dry stage 11/16/2017   Pseudophakia of both eyes 11/16/2017   Stroke (HCC) 02/22/2020    Past Surgical History:  Procedure Laterality Date   ABDOMINAL HYSTERECTOMY     BLADDER SUSPENSION     EYE SURGERY     TONSILLECTOMY      Current Medications: Current Meds  Medication Sig   atorvastatin (LIPITOR) 20 MG tablet Take 20 mg by mouth daily.   Cranberry-Vitamin C-Vitamin E 4200-20-3 MG-MG-UNIT CAPS Take 1 tablet by mouth 2 (two) times daily.   diltiazem (CARDIZEM CD) 120 MG 24 hr capsule Take 1 capsule (120 mg total) by mouth 2 (two) times daily.   ELIQUIS 5 MG TABS tablet TAKE 1 TABLET(5 MG) BY MOUTH TWICE DAILY (Patient taking differently: Take 5 mg by mouth 2 (two) times daily.)   Ferrous  Fumarate (HEMOCYTE - 106 MG FE) 324 (106 Fe) MG TABS tablet Take 324 mg of iron by mouth daily with breakfast.     Allergies:   Ciprofloxacin, Aspartame and phenylalanine, Codeine, Diovan [valsartan], Flagyl [metronidazole], Macrobid [nitrofurantoin macrocrystal], Phenylalanine, Prednisone, Sulfa antibiotics, Vitasana [actical], and Aspirin   Social History   Socioeconomic History   Marital status: Widowed    Spouse name: Not on file   Number of children: Not on file   Years of education: Not on file   Highest education level: Not on file  Occupational History   Not on file  Tobacco Use   Smoking status: Never   Smokeless tobacco: Never  Substance and Sexual Activity   Alcohol use: No   Drug use: No   Sexual activity: Not on file  Other Topics Concern   Not on file  Social History Narrative   Not on file   Social Determinants of Health   Financial Resource Strain: Not on file  Food Insecurity: Not on file  Transportation Needs: Not on file  Physical Activity: Not on file  Stress: Not on file  Social Connections: Not on file     Family History: The patient's family history includes Coronary artery disease in her brother and father. ROS:   Please see the history of present illness.    All 14 point review of systems negative except  as described per history of present illness  EKGs/Labs/Other Studies Reviewed:      Recent Labs: 09/28/2021: Hemoglobin 14.8; Magnesium 2.1; Platelets 139 12/15/2021: ALT 18; BUN 18; Creatinine, Ser 1.08; Potassium 4.3; Sodium 143; TSH 9.650  Recent Lipid Panel    Component Value Date/Time   CHOL 179 02/24/2020 0154   TRIG 51 02/24/2020 0154   HDL 70 02/24/2020 0154   CHOLHDL 2.6 02/24/2020 0154   VLDL 10 02/24/2020 0154   LDLCALC 99 02/24/2020 0154    Physical Exam:    VS:  BP 136/64 (BP Location: Left Arm, Patient Position: Sitting)   Pulse 78   Resp (!) 95   Ht 5' (1.524 m)   Wt 147 lb (66.7 kg)   BMI 28.71 kg/m     Wt  Readings from Last 3 Encounters:  01/19/22 147 lb (66.7 kg)  12/23/21 144 lb 1.9 oz (65.4 kg)  12/15/21 146 lb 3.2 oz (66.3 kg)     GEN:  Well nourished, well developed in no acute distress HEENT: Normal NECK: No JVD; No carotid bruits LYMPHATICS: No lymphadenopathy CARDIAC: RRR, no murmurs, no rubs, no gallops RESPIRATORY:  Clear to auscultation without rales, wheezing or rhonchi  ABDOMEN: Soft, non-tender, non-distended MUSCULOSKELETAL:  No edema; No deformity  SKIN: Warm and dry LOWER EXTREMITIES: no swelling NEUROLOGIC:  Alert and oriented x 3 PSYCHIATRIC:  Normal affect   ASSESSMENT:    1. Paroxysmal atrial fibrillation (HCC)   2. Essential hypertension   3. Dyslipidemia    PLAN:    In order of problems listed above:  Paroxysmal atrial fibrillation she sounds very regular today we will do EKG.  Continue anticoagulation.  Dose is appropriate to her weight kidney function and age. Essential hypertension blood pressure well-controlled continue present management. Dyslipidemia I did review K PN which show me her LDL 99 and HDL 70.  Will continue present management which include Lipitor 20.  Last time I checked her liver function test and that were normal   Medication Adjustments/Labs and Tests Ordered: Current medicines are reviewed at length with the patient today.  Concerns regarding medicines are outlined above.  No orders of the defined types were placed in this encounter.  Medication changes: No orders of the defined types were placed in this encounter.   Signed, [name], MD, [facility] 01/19/2022 3:18 PM     [facility]

## 2022-04-07 ENCOUNTER — Other Ambulatory Visit: Payer: Self-pay | Admitting: Cardiology

## 2022-04-07 NOTE — Telephone Encounter (Signed)
Prescription refill request for Eliquis received. Indication: AF Last office visit: 01/19/22  Nelta Numbers MD Scr: 1.08 on 12/15/21 Age: 89 Weight: 66.7kg  Based on above findings Eliquis 70m twice daily is the appropriate dose.  Refill approved.

## 2022-05-22 ENCOUNTER — Other Ambulatory Visit: Payer: Self-pay | Admitting: Cardiology

## 2022-05-24 ENCOUNTER — Encounter: Payer: Self-pay | Admitting: Cardiology

## 2022-05-24 ENCOUNTER — Ambulatory Visit: Payer: Medicare Other | Attending: Cardiology | Admitting: Cardiology

## 2022-05-24 VITALS — BP 108/70 | HR 58 | Ht 60.0 in | Wt 148.0 lb

## 2022-05-24 DIAGNOSIS — I10 Essential (primary) hypertension: Secondary | ICD-10-CM | POA: Insufficient documentation

## 2022-05-24 DIAGNOSIS — I6930 Unspecified sequelae of cerebral infarction: Secondary | ICD-10-CM | POA: Insufficient documentation

## 2022-05-24 DIAGNOSIS — E785 Hyperlipidemia, unspecified: Secondary | ICD-10-CM | POA: Insufficient documentation

## 2022-05-24 DIAGNOSIS — I480 Paroxysmal atrial fibrillation: Secondary | ICD-10-CM | POA: Insufficient documentation

## 2022-05-24 MED ORDER — DILTIAZEM HCL ER COATED BEADS 120 MG PO CP24
120.0000 mg | ORAL_CAPSULE | Freq: Two times a day (BID) | ORAL | 2 refills | Status: DC
Start: 2022-05-24 — End: 2023-02-24

## 2022-05-24 MED ORDER — FERROUS FUMARATE 324 (106 FE) MG PO TABS: 324.0000 mg | ORAL_TABLET | Freq: Every day | ORAL | 2 refills | Status: DC

## 2022-05-24 NOTE — Patient Instructions (Signed)

## 2022-05-24 NOTE — Progress Notes (Signed)
Cardiology Office Note:    Date:  05/24/2022   ID:  Stephanie Foster, DOB 1924-04-30, MRN [id number]  PCP:  Shellia Cleverly, PA  Cardiologist:  Gypsy Balsam, MD    Referring MD: Shellia Cleverly, [place]   Chief Complaint  Patient presents with   Medication Refill    Cardizem and Ferrous Fum     History of Present Illness:    Stephanie Foster is a 89 y.o. female past medical history significant for paroxysmal atrial fibrillation, essential hypertension, GERD, history of CVA.  She is in my office today for follow-up.  Overall doing well.  She complained of having some balance issue but otherwise no chest pain tightness squeezing pressure burning chest, after admit for somebody her age she is doing quite well.  Past Medical History:  Diagnosis Date   Cataract    Chest pain 03/24/2012   Diaphoresis 03/24/2012   Diverticulosis    Dry eye syndrome of both lacrimal glands 11/16/2017   Dyslipidemia 02/28/2020   Dyspnea on exertion 02/28/2020   Essential hypertension 02/28/2020   GERD (gastroesophageal reflux disease)    H/O hiatal hernia    Hernia    Hypertension    Late effect of cerebrovascular accident (CVA) 02/28/2020   Nausea 03/24/2012   Nonexudative age-related macular degeneration, bilateral, early dry stage 11/16/2017   Pseudophakia of both eyes 11/16/2017   Stroke 02/22/2020    Past Surgical History:  Procedure Laterality Date   ABDOMINAL HYSTERECTOMY     BLADDER SUSPENSION     EYE SURGERY     TONSILLECTOMY      Current Medications: Current Meds  Medication Sig   atorvastatin (LIPITOR) 20 MG tablet Take 20 mg by mouth daily.   Cranberry-Vitamin C-Vitamin E 4200-20-3 MG-MG-UNIT CAPS Take 1 tablet by mouth 2 (two) times daily.   diltiazem (CARDIZEM CD) 120 MG 24 hr capsule Take 1 capsule (120 mg total) by mouth 2 (two) times daily.   ELIQUIS 5 MG TABS tablet TAKE 1 TABLET(5 MG) BY MOUTH TWICE DAILY (Patient taking differently: Take 5 mg by mouth 2 (two) times daily.)   Ferrous Fumarate  (HEMOCYTE - 106 MG FE) 324 (106 Fe) MG TABS tablet Take 324 mg of iron by mouth daily with breakfast.     Allergies:   Ciprofloxacin, Dextromethorphan-guaifenesin, Aspartame and phenylalanine, Codeine, Diovan [valsartan], Flagyl [metronidazole], Macrobid [nitrofurantoin macrocrystal], Phenylalanine, Prednisone, Sulfasalazine, Vitasana [actical], Aspirin, Doxycycline, and Sulfa antibiotics   Social History   Socioeconomic History   Marital status: Widowed    Spouse name: Not on file   Number of children: Not on file   Years of education: Not on file   Highest education level: Not on file  Occupational History   Not on file  Tobacco Use   Smoking status: Never   Smokeless tobacco: Never  Substance and Sexual Activity   Alcohol use: No   Drug use: No   Sexual activity: Not on file  Other Topics Concern   Not on file  Social History Narrative   Not on file   Social Determinants of Health   Financial Resource Strain: Not on file  Food Insecurity: Not on file  Transportation Needs: Not on file  Physical Activity: Not on file  Stress: Not on file  Social Connections: Not on file     Family History: The patient's family history includes Coronary artery disease in her brother and father. ROS:   Please see the history of present illness.  All 14 point review of systems negative except as described per history of present illness  EKGs/Labs/Other Studies Reviewed:      Recent Labs: 09/28/2021: Hemoglobin 14.8; Magnesium 2.1; Platelets 139 12/15/2021: ALT 18; BUN 18; Creatinine, Ser 1.08; Potassium 4.3; Sodium 143; TSH 9.650  Recent Lipid Panel    Component Value Date/Time   CHOL 179 02/24/2020 0154   TRIG 51 02/24/2020 0154   HDL 70 02/24/2020 0154   CHOLHDL 2.6 02/24/2020 0154   VLDL 10 02/24/2020 0154   LDLCALC 99 02/24/2020 0154    Physical Exam:    VS:  BP 108/70 (BP Location: Left Arm, Patient Position: Sitting)   Pulse (!) 58   Ht 5' (1.524 m)   Wt 148 lb  (67.1 kg)   SpO2 96%   BMI 28.90 kg/m     Wt Readings from Last 3 Encounters:  05/24/22 148 lb (67.1 kg)  01/19/22 147 lb (66.7 kg)  12/23/21 144 lb 1.9 oz (65.4 kg)     GEN:  Well nourished, well developed in no acute distress HEENT: Normal NECK: No JVD; No carotid bruits LYMPHATICS: No lymphadenopathy CARDIAC: RRR, no murmurs, no rubs, no gallops RESPIRATORY:  Clear to auscultation without rales, wheezing or rhonchi  ABDOMEN: Soft, non-tender, non-distended MUSCULOSKELETAL:  No edema; No deformity  SKIN: Warm and dry LOWER EXTREMITIES: no swelling NEUROLOGIC:  Alert and oriented x 3 PSYCHIATRIC:  Normal affect   ASSESSMENT:    1. Essential hypertension   2. Paroxysmal atrial fibrillation   3. Dyslipidemia   4. Late effect of cerebrovascular accident (CVA)    PLAN:    In order of problems listed above:  Paroxysmal atrial fibrillation, seems to maintain sinus rhythm, anticoagulated Eliquis and dose is appropriate to her age weight and kidney function.  Will continue.  Denies have any palpitations. Essential hypertension blood pressure well-controlled continue present management. Dyslipidemia I did review K PN which show me LDL 99 HDL 70 we will continue present management. Late effect of CVA stable no new issues.   Medication Adjustments/Labs and Tests Ordered: Current medicines are reviewed at length with the patient today.  Concerns regarding medicines are outlined above.  No orders of the defined types were placed in this encounter.  Medication changes: No orders of the defined types were placed in this encounter.   Signed, [name], MD, [facility] 05/24/2022 10:30 AM    [facility]

## 2022-10-15 ENCOUNTER — Other Ambulatory Visit: Payer: Self-pay | Admitting: Cardiology

## 2022-12-21 ENCOUNTER — Ambulatory Visit: Payer: Medicare Other | Attending: Cardiology | Admitting: Cardiology

## 2022-12-21 ENCOUNTER — Encounter: Payer: Self-pay | Admitting: Cardiology

## 2022-12-21 VITALS — BP 106/62 | HR 82 | Ht 60.0 in | Wt 146.0 lb

## 2022-12-21 DIAGNOSIS — E785 Hyperlipidemia, unspecified: Secondary | ICD-10-CM | POA: Diagnosis not present

## 2022-12-21 DIAGNOSIS — I10 Essential (primary) hypertension: Secondary | ICD-10-CM | POA: Insufficient documentation

## 2022-12-21 DIAGNOSIS — I480 Paroxysmal atrial fibrillation: Secondary | ICD-10-CM | POA: Diagnosis present

## 2022-12-21 DIAGNOSIS — Z8673 Personal history of transient ischemic attack (TIA), and cerebral infarction without residual deficits: Secondary | ICD-10-CM | POA: Insufficient documentation

## 2022-12-21 DIAGNOSIS — I6930 Unspecified sequelae of cerebral infarction: Secondary | ICD-10-CM | POA: Insufficient documentation

## 2022-12-21 NOTE — Patient Instructions (Signed)

## 2022-12-21 NOTE — Progress Notes (Signed)
Cardiology Office Note:    Date:  12/21/2022   ID:  Stephanie Foster, DOB 1924/08/29, MRN [id number]  PCP:  Shellia Cleverly, PA  Cardiologist:  Gypsy Balsam, MD    Referring MD: Shellia Cleverly, [place]   Chief Complaint  Patient presents with   Follow-up    History of Present Illness:    Stephanie Foster is a 89 y.o. female past medical history significant for paroxysmal atrial fibrillation, maintained sinus rhythm, essential hypertension, GERD, history of CVA.  Comes today to months for follow-up overall complain of being weak tired exhausted.  Recently she was found to have severe hypothyroidism, she was given thyroid medication but after taking few tablets she did not like it and she stopped it.  Interestingly I see another TSH checked which was perfectly normal.  Denies have any cardiac complaints  Past Medical History:  Diagnosis Date   Cataract    Chest pain 03/24/2012   Diaphoresis 03/24/2012   Diverticulosis    Dry eye syndrome of both lacrimal glands 11/16/2017   Dyslipidemia 02/28/2020   Dyspnea on exertion 02/28/2020   Essential hypertension 02/28/2020   GERD (gastroesophageal reflux disease)    H/O hiatal hernia    Hernia    Hypertension    Late effect of cerebrovascular accident (CVA) 02/28/2020   Nausea 03/24/2012   Nonexudative age-related macular degeneration, bilateral, early dry stage 11/16/2017   Pseudophakia of both eyes 11/16/2017   Stroke (HCC) 02/22/2020    Past Surgical History:  Procedure Laterality Date   ABDOMINAL HYSTERECTOMY     BLADDER SUSPENSION     EYE SURGERY     TONSILLECTOMY      Current Medications: Current Meds  Medication Sig   atorvastatin (LIPITOR) 20 MG tablet Take 20 mg by mouth daily.   Cranberry-Vitamin C-Vitamin E 4200-20-3 MG-MG-UNIT CAPS Take 1 tablet by mouth 2 (two) times daily.   diltiazem (CARDIZEM CD) 120 MG 24 hr capsule Take 1 capsule (120 mg total) by mouth 2 (two) times daily. (Patient taking differently: Take 120 mg by mouth once a  week. On Saturday's)   ELIQUIS 5 MG TABS tablet TAKE 1 TABLET(5 MG) BY MOUTH TWICE DAILY (Patient taking differently: Take 5 mg by mouth 2 (two) times daily.)   Ferrous Fumarate (HEMOCYTE - 106 MG FE) 324 (106 Fe) MG TABS tablet Take 3 tablets (318 mg of iron total) by mouth daily with breakfast. (Patient taking differently: Take 324 mg of iron by mouth every other day.)     Allergies:   Ciprofloxacin, Dextromethorphan-guaifenesin, Aspartame and phenylalanine, Codeine, Diovan [valsartan], Flagyl [metronidazole], Macrobid [nitrofurantoin macrocrystal], Phenylalanine, Prednisone, Sulfasalazine, Vitasana [actical], Aspirin, Doxycycline, and Sulfa antibiotics   Social History   Socioeconomic History   Marital status: Widowed    Spouse name: Not on file   Number of children: Not on file   Years of education: Not on file   Highest education level: Not on file  Occupational History   Not on file  Tobacco Use   Smoking status: Never   Smokeless tobacco: Never  Substance and Sexual Activity   Alcohol use: No   Drug use: No   Sexual activity: Not on file  Other Topics Concern   Not on file  Social History Narrative   Not on file   Social Determinants of Health   Financial Resource Strain: Not on file  Food Insecurity: Not on file  Transportation Needs: Not on file  Physical Activity: Not on file  Stress:  Not on file  Social Connections: Not on file     Family History: The patient's family history includes Coronary artery disease in her brother and father. ROS:   Please see the history of present illness.    All 14 point review of systems negative except as described per history of present illness  EKGs/Labs/Other Studies Reviewed:         Recent Labs: No results found for requested labs within last 365 days.  Recent Lipid Panel    Component Value Date/Time   CHOL 179 02/24/2020 0154   TRIG 51 02/24/2020 0154   HDL 70 02/24/2020 0154   CHOLHDL 2.6 02/24/2020 0154    VLDL 10 02/24/2020 0154   LDLCALC 99 02/24/2020 0154    Physical Exam:    VS:  BP 106/62 (BP Location: Left Arm, Patient Position: Sitting)   Pulse 82   Ht 5' (1.524 m)   Wt 146 lb (66.2 kg)   SpO2 94%   BMI 28.51 kg/m     Wt Readings from Last 3 Encounters:  12/21/22 146 lb (66.2 kg)  05/24/22 148 lb (67.1 kg)  01/19/22 147 lb (66.7 kg)     GEN:  Well nourished, well developed in no acute distress HEENT: Normal NECK: No JVD; No carotid bruits LYMPHATICS: No lymphadenopathy CARDIAC: RRR, no murmurs, no rubs, no gallops RESPIRATORY:  Clear to auscultation without rales, wheezing or rhonchi  ABDOMEN: Soft, non-tender, non-distended MUSCULOSKELETAL:  No edema; No deformity  SKIN: Warm and dry LOWER EXTREMITIES: no swelling NEUROLOGIC:  Alert and oriented x 3 PSYCHIATRIC:  Normal affect   ASSESSMENT:    1. Paroxysmal atrial fibrillation (HCC)   2. Essential hypertension   3. 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 Dyslipidemia   5. Late effect of cerebrovascular accident (CVA)    PLAN:    In order of problems listed above:  Paroxysmal atrial fibrillation anticoagulated dose is appropriate to her age weight and kidney function.  Will continue. Essential hypertension blood pressure well-controlled. History of CVA stable from that point review. Dyslipidemia I did review K PN LDL 99 HDL 70.  She is taking Lipitor 20 which I will continue.   Medication Adjustments/Labs and Tests Ordered: Current medicines are reviewed at length with the patient today.  Concerns regarding medicines are outlined above.  No orders of the defined types were placed in this encounter.  Medication changes: No orders of the defined types were placed in this encounter.   Signed, [name], MD, [facility] 12/21/2022 11:00 AM    [facility]

## 2023-01-06 ENCOUNTER — Other Ambulatory Visit: Payer: Self-pay

## 2023-01-06 DIAGNOSIS — I480 Paroxysmal atrial fibrillation: Secondary | ICD-10-CM | POA: Diagnosis present

## 2023-01-06 DIAGNOSIS — D62 Acute posthemorrhagic anemia: Secondary | ICD-10-CM | POA: Diagnosis present

## 2023-01-06 DIAGNOSIS — Z881 Allergy status to other antibiotic agents status: Secondary | ICD-10-CM

## 2023-01-06 DIAGNOSIS — T381X6A Underdosing of thyroid hormones and substitutes, initial encounter: Secondary | ICD-10-CM | POA: Diagnosis present

## 2023-01-06 DIAGNOSIS — E039 Hypothyroidism, unspecified: Secondary | ICD-10-CM | POA: Diagnosis present

## 2023-01-06 DIAGNOSIS — Z886 Allergy status to analgesic agent status: Secondary | ICD-10-CM

## 2023-01-06 DIAGNOSIS — H9190 Unspecified hearing loss, unspecified ear: Secondary | ICD-10-CM | POA: Diagnosis present

## 2023-01-06 DIAGNOSIS — Z8249 Family history of ischemic heart disease and other diseases of the circulatory system: Secondary | ICD-10-CM

## 2023-01-06 DIAGNOSIS — Z885 Allergy status to narcotic agent status: Secondary | ICD-10-CM

## 2023-01-06 DIAGNOSIS — Z90710 Acquired absence of both cervix and uterus: Secondary | ICD-10-CM

## 2023-01-06 DIAGNOSIS — Z9849 Cataract extraction status, unspecified eye: Secondary | ICD-10-CM

## 2023-01-06 DIAGNOSIS — Z882 Allergy status to sulfonamides status: Secondary | ICD-10-CM

## 2023-01-06 DIAGNOSIS — E785 Hyperlipidemia, unspecified: Secondary | ICD-10-CM | POA: Diagnosis present

## 2023-01-06 DIAGNOSIS — H353131 Nonexudative age-related macular degeneration, bilateral, early dry stage: Secondary | ICD-10-CM | POA: Diagnosis present

## 2023-01-06 DIAGNOSIS — Z79899 Other long term (current) drug therapy: Secondary | ICD-10-CM

## 2023-01-06 DIAGNOSIS — Z7901 Long term (current) use of anticoagulants: Secondary | ICD-10-CM

## 2023-01-06 DIAGNOSIS — K921 Melena: Secondary | ICD-10-CM | POA: Diagnosis not present

## 2023-01-06 DIAGNOSIS — K219 Gastro-esophageal reflux disease without esophagitis: Secondary | ICD-10-CM | POA: Diagnosis present

## 2023-01-06 DIAGNOSIS — Z8719 Personal history of other diseases of the digestive system: Secondary | ICD-10-CM

## 2023-01-06 DIAGNOSIS — R911 Solitary pulmonary nodule: Secondary | ICD-10-CM | POA: Diagnosis present

## 2023-01-06 DIAGNOSIS — I129 Hypertensive chronic kidney disease with stage 1 through stage 4 chronic kidney disease, or unspecified chronic kidney disease: Secondary | ICD-10-CM | POA: Diagnosis present

## 2023-01-06 DIAGNOSIS — Z91128 Patient's intentional underdosing of medication regimen for other reason: Secondary | ICD-10-CM

## 2023-01-06 DIAGNOSIS — K449 Diaphragmatic hernia without obstruction or gangrene: Secondary | ICD-10-CM | POA: Diagnosis present

## 2023-01-06 DIAGNOSIS — Z9889 Other specified postprocedural states: Secondary | ICD-10-CM

## 2023-01-06 DIAGNOSIS — I6930 Unspecified sequelae of cerebral infarction: Secondary | ICD-10-CM

## 2023-01-06 DIAGNOSIS — J9601 Acute respiratory failure with hypoxia: Secondary | ICD-10-CM | POA: Diagnosis present

## 2023-01-06 DIAGNOSIS — R339 Retention of urine, unspecified: Secondary | ICD-10-CM | POA: Diagnosis present

## 2023-01-06 DIAGNOSIS — Z9089 Acquired absence of other organs: Secondary | ICD-10-CM

## 2023-01-06 DIAGNOSIS — Z961 Presence of intraocular lens: Secondary | ICD-10-CM | POA: Diagnosis present

## 2023-01-06 DIAGNOSIS — N1831 Chronic kidney disease, stage 3a: Secondary | ICD-10-CM | POA: Diagnosis present

## 2023-01-06 DIAGNOSIS — Z888 Allergy status to other drugs, medicaments and biological substances status: Secondary | ICD-10-CM

## 2023-01-06 DIAGNOSIS — K922 Gastrointestinal hemorrhage, unspecified: Secondary | ICD-10-CM | POA: Diagnosis not present

## 2023-01-06 NOTE — ED Triage Notes (Signed)
Pt states she has had ABD pain and nausea since last night, believes it may be from a salad she had for dinner, denies fever, vomiting or diarrhea

## 2023-01-07 ENCOUNTER — Inpatient Hospital Stay (HOSPITAL_BASED_OUTPATIENT_CLINIC_OR_DEPARTMENT_OTHER)
Admission: EM | Admit: 2023-01-07 | Discharge: 2023-01-09 | DRG: 377 | Disposition: A | Discharge status: Home / Self Care | Payer: Medicare Other | Attending: Family Medicine | Admitting: Family Medicine

## 2023-01-07 ENCOUNTER — Emergency Department (HOSPITAL_BASED_OUTPATIENT_CLINIC_OR_DEPARTMENT_OTHER): Payer: Medicare Other

## 2023-01-07 ENCOUNTER — Encounter (HOSPITAL_BASED_OUTPATIENT_CLINIC_OR_DEPARTMENT_OTHER): Payer: Self-pay | Admitting: Emergency Medicine

## 2023-01-07 DIAGNOSIS — H9190 Unspecified hearing loss, unspecified ear: Secondary | ICD-10-CM | POA: Diagnosis not present

## 2023-01-07 DIAGNOSIS — N183 Chronic kidney disease, stage 3 unspecified: Secondary | ICD-10-CM | POA: Diagnosis present

## 2023-01-07 DIAGNOSIS — Z961 Presence of intraocular lens: Secondary | ICD-10-CM | POA: Diagnosis not present

## 2023-01-07 DIAGNOSIS — Z90710 Acquired absence of both cervix and uterus: Secondary | ICD-10-CM | POA: Diagnosis not present

## 2023-01-07 DIAGNOSIS — E039 Hypothyroidism, unspecified: Secondary | ICD-10-CM | POA: Diagnosis not present

## 2023-01-07 DIAGNOSIS — I129 Hypertensive chronic kidney disease with stage 1 through stage 4 chronic kidney disease, or unspecified chronic kidney disease: Secondary | ICD-10-CM | POA: Diagnosis not present

## 2023-01-07 DIAGNOSIS — K219 Gastro-esophageal reflux disease without esophagitis: Secondary | ICD-10-CM | POA: Diagnosis present

## 2023-01-07 DIAGNOSIS — N1831 Chronic kidney disease, stage 3a: Secondary | ICD-10-CM | POA: Diagnosis not present

## 2023-01-07 DIAGNOSIS — K921 Melena: Secondary | ICD-10-CM | POA: Diagnosis not present

## 2023-01-07 DIAGNOSIS — Z8249 Family history of ischemic heart disease and other diseases of the circulatory system: Secondary | ICD-10-CM | POA: Diagnosis not present

## 2023-01-07 DIAGNOSIS — K449 Diaphragmatic hernia without obstruction or gangrene: Secondary | ICD-10-CM | POA: Diagnosis not present

## 2023-01-07 DIAGNOSIS — I480 Paroxysmal atrial fibrillation: Secondary | ICD-10-CM | POA: Diagnosis not present

## 2023-01-07 DIAGNOSIS — I10 Essential (primary) hypertension: Secondary | ICD-10-CM | POA: Diagnosis present

## 2023-01-07 DIAGNOSIS — Z79899 Other long term (current) drug therapy: Secondary | ICD-10-CM | POA: Diagnosis not present

## 2023-01-07 DIAGNOSIS — E785 Hyperlipidemia, unspecified: Secondary | ICD-10-CM | POA: Diagnosis not present

## 2023-01-07 DIAGNOSIS — R339 Retention of urine, unspecified: Secondary | ICD-10-CM | POA: Diagnosis not present

## 2023-01-07 DIAGNOSIS — H353131 Nonexudative age-related macular degeneration, bilateral, early dry stage: Secondary | ICD-10-CM | POA: Diagnosis not present

## 2023-01-07 DIAGNOSIS — K922 Gastrointestinal hemorrhage, unspecified: Principal | ICD-10-CM | POA: Diagnosis present

## 2023-01-07 DIAGNOSIS — J9601 Acute respiratory failure with hypoxia: Secondary | ICD-10-CM

## 2023-01-07 DIAGNOSIS — Z91128 Patient's intentional underdosing of medication regimen for other reason: Secondary | ICD-10-CM | POA: Diagnosis not present

## 2023-01-07 DIAGNOSIS — Z9849 Cataract extraction status, unspecified eye: Secondary | ICD-10-CM | POA: Diagnosis not present

## 2023-01-07 DIAGNOSIS — T381X6A Underdosing of thyroid hormones and substitutes, initial encounter: Secondary | ICD-10-CM | POA: Diagnosis not present

## 2023-01-07 DIAGNOSIS — Z8719 Personal history of other diseases of the digestive system: Secondary | ICD-10-CM | POA: Diagnosis not present

## 2023-01-07 DIAGNOSIS — I6930 Unspecified sequelae of cerebral infarction: Secondary | ICD-10-CM | POA: Diagnosis not present

## 2023-01-07 DIAGNOSIS — R911 Solitary pulmonary nodule: Secondary | ICD-10-CM | POA: Diagnosis not present

## 2023-01-07 DIAGNOSIS — R338 Other retention of urine: Secondary | ICD-10-CM

## 2023-01-07 DIAGNOSIS — D62 Acute posthemorrhagic anemia: Secondary | ICD-10-CM | POA: Diagnosis not present

## 2023-01-07 DIAGNOSIS — Z7901 Long term (current) use of anticoagulants: Secondary | ICD-10-CM | POA: Diagnosis not present

## 2023-01-07 LAB — CBC WITH DIFFERENTIAL/PLATELET
Abs Immature Granulocytes: 0.04 10*3/uL (ref 0.00–0.07)
Basophils Absolute: 0 10*3/uL (ref 0.0–0.1)
Basophils Relative: 0 %
Eosinophils Absolute: 0 10*3/uL (ref 0.0–0.5)
Eosinophils Relative: 0 %
HCT: 18 % — ABNORMAL LOW (ref 36.0–46.0)
Hemoglobin: 5 g/dL — CL (ref 12.0–15.0)
Immature Granulocytes: 1 %
Lymphocytes Relative: 6 %
Lymphs Abs: 0.5 10*3/uL — ABNORMAL LOW (ref 0.7–4.0)
MCH: 18.7 pg — ABNORMAL LOW (ref 26.0–34.0)
MCHC: 27.8 g/dL — ABNORMAL LOW (ref 30.0–36.0)
MCV: 67.4 fL — ABNORMAL LOW (ref 80.0–100.0)
Monocytes Absolute: 0.7 10*3/uL (ref 0.1–1.0)
Monocytes Relative: 8 %
Neutro Abs: 7.5 10*3/uL (ref 1.7–7.7)
Neutrophils Relative %: 85 %
Platelets: 206 10*3/uL (ref 150–400)
RBC: 2.67 MIL/uL — ABNORMAL LOW (ref 3.87–5.11)
RDW: 20.2 % — ABNORMAL HIGH (ref 11.5–15.5)
WBC: 8.8 10*3/uL (ref 4.0–10.5)
nRBC: 0.3 % — ABNORMAL HIGH (ref 0.0–0.2)

## 2023-01-07 LAB — COMPREHENSIVE METABOLIC PANEL
ALT: 12 U/L (ref 0–44)
AST: 17 U/L (ref 15–41)
Albumin: 3.3 g/dL — ABNORMAL LOW (ref 3.5–5.0)
Alkaline Phosphatase: 90 U/L (ref 38–126)
Anion gap: 8 (ref 5–15)
BUN: 24 mg/dL — ABNORMAL HIGH (ref 8–23)
CO2: 24 mmol/L (ref 22–32)
Calcium: 8.9 mg/dL (ref 8.9–10.3)
Chloride: 103 mmol/L (ref 98–111)
Creatinine, Ser: 1.05 mg/dL — ABNORMAL HIGH (ref 0.44–1.00)
GFR, Estimated: 49 mL/min — ABNORMAL LOW (ref >60)
Glucose, Bld: 143 mg/dL — ABNORMAL HIGH (ref 70–99)
Potassium: 4 mmol/L (ref 3.5–5.1)
Sodium: 135 mmol/L (ref 135–145)
Total Bilirubin: 0.8 mg/dL (ref <1.2)
Total Protein: 5.8 g/dL — ABNORMAL LOW (ref 6.5–8.1)

## 2023-01-07 LAB — URINALYSIS, ROUTINE W REFLEX MICROSCOPIC
Bilirubin Urine: NEGATIVE
Glucose, UA: NEGATIVE mg/dL
Ketones, ur: NEGATIVE mg/dL
Leukocytes,Ua: NEGATIVE
Nitrite: NEGATIVE
Protein, ur: NEGATIVE mg/dL
Specific Gravity, Urine: 1.015 (ref 1.005–1.030)
pH: 7 (ref 5.0–8.0)

## 2023-01-07 LAB — CBC
HCT: 21.2 % — ABNORMAL LOW (ref 36.0–46.0)
Hemoglobin: 5.9 g/dL — CL (ref 12.0–15.0)
MCH: 19 pg — ABNORMAL LOW (ref 26.0–34.0)
MCHC: 27.8 g/dL — ABNORMAL LOW (ref 30.0–36.0)
MCV: 68.2 fL — ABNORMAL LOW (ref 80.0–100.0)
Platelets: 235 10*3/uL (ref 150–400)
RBC: 3.11 MIL/uL — ABNORMAL LOW (ref 3.87–5.11)
RDW: 20.3 % — ABNORMAL HIGH (ref 11.5–15.5)
WBC: 8.9 10*3/uL (ref 4.0–10.5)
nRBC: 0 % (ref 0.0–0.2)

## 2023-01-07 LAB — LIPASE, BLOOD: Lipase: 32 U/L (ref 11–51)

## 2023-01-07 LAB — URINALYSIS, MICROSCOPIC (REFLEX)

## 2023-01-07 LAB — HEMOGLOBIN AND HEMATOCRIT, BLOOD
HCT: 18.9 % — ABNORMAL LOW (ref 36.0–46.0)
HCT: 26.5 % — ABNORMAL LOW (ref 36.0–46.0)
HCT: 27.4 % — ABNORMAL LOW (ref 36.0–46.0)
Hemoglobin: 5.2 g/dL — CL (ref 12.0–15.0)
Hemoglobin: 8.1 g/dL — ABNORMAL LOW (ref 12.0–15.0)
Hemoglobin: 8.2 g/dL — ABNORMAL LOW (ref 12.0–15.0)

## 2023-01-07 LAB — PROTIME-INR
INR: 1.2 (ref 0.8–1.2)
Prothrombin Time: 14.9 s (ref 11.4–15.2)

## 2023-01-07 LAB — ABO/RH: ABO/RH(D): O POS

## 2023-01-07 LAB — PREPARE RBC (CROSSMATCH)

## 2023-01-07 LAB — TROPONIN I (HIGH SENSITIVITY)
Troponin I (High Sensitivity): 9 ng/L (ref <18)
Troponin I (High Sensitivity): 12 ng/L (ref <18)

## 2023-01-07 LAB — OCCULT BLOOD X 1 CARD TO LAB, STOOL: Fecal Occult Bld: POSITIVE — AB

## 2023-01-07 MED ORDER — ONDANSETRON HCL 4 MG/2ML IJ SOLN
4.0000 mg | Freq: Four times a day (QID) | INTRAMUSCULAR | Status: DC | PRN
  Administered 2023-01-07: 4 mg via INTRAVENOUS
  Filled 2023-01-07: qty 2

## 2023-01-07 MED ORDER — IOHEXOL 300 MG/ML  SOLN
100.0000 mL | Freq: Once | INTRAMUSCULAR | Status: AC | PRN
  Administered 2023-01-07: 100 mL via INTRAVENOUS

## 2023-01-07 MED ORDER — FAMOTIDINE IN NACL 20-0.9 MG/50ML-% IV SOLN
20.0000 mg | Freq: Once | INTRAVENOUS | Status: AC
  Administered 2023-01-07: 20 mg via INTRAVENOUS
  Filled 2023-01-07: qty 50

## 2023-01-07 MED ORDER — ORAL CARE MOUTH RINSE: 15.0000 mL | OROMUCOSAL | Status: DC | PRN

## 2023-01-07 MED ORDER — PROTHROMBIN COMPLEX CONC HUMAN 500 UNITS IV KIT
PACK | INTRAVENOUS | Status: AC
  Filled 2023-01-07: qty 500

## 2023-01-07 MED ORDER — ONDANSETRON HCL 4 MG/2ML IJ SOLN
INTRAMUSCULAR | Status: AC
  Filled 2023-01-07: qty 2

## 2023-01-07 MED ORDER — ALBUTEROL SULFATE (2.5 MG/3ML) 0.083% IN NEBU: 2.5000 mg | INHALATION_SOLUTION | RESPIRATORY_TRACT | Status: DC | PRN

## 2023-01-07 MED ORDER — PROTHROMBIN COMPLEX CONC HUMAN 500 UNITS IV KIT
PACK | INTRAVENOUS | Status: AC
  Filled 2023-01-07: qty 2500

## 2023-01-07 MED ORDER — SODIUM CHLORIDE 0.9% IV SOLUTION: Freq: Once | INTRAVENOUS | Status: AC

## 2023-01-07 MED ORDER — ONDANSETRON HCL 4 MG/2ML IJ SOLN
4.0000 mg | Freq: Once | INTRAMUSCULAR | Status: AC
  Administered 2023-01-07: 4 mg via INTRAVENOUS

## 2023-01-07 MED ORDER — ACETAMINOPHEN 325 MG PO TABS: 650.0000 mg | ORAL_TABLET | Freq: Four times a day (QID) | ORAL | Status: DC | PRN

## 2023-01-07 MED ORDER — PANTOPRAZOLE SODIUM 40 MG IV SOLR
40.0000 mg | Freq: Two times a day (BID) | INTRAVENOUS | Status: DC
  Administered 2023-01-07 – 2023-01-08 (×4): 40 mg via INTRAVENOUS
  Filled 2023-01-07 (×4): qty 10

## 2023-01-07 MED ORDER — POLYETHYLENE GLYCOL 3350 17 G PO PACK
17.0000 g | PACK | Freq: Every day | ORAL | Status: DC
  Administered 2023-01-07 – 2023-01-08 (×2): 17 g via ORAL
  Filled 2023-01-07 (×2): qty 1

## 2023-01-07 MED ORDER — ACETAMINOPHEN 650 MG RE SUPP: 650.0000 mg | Freq: Four times a day (QID) | RECTAL | Status: DC | PRN

## 2023-01-07 MED ORDER — TRAZODONE HCL 50 MG PO TABS: 25.0000 mg | ORAL_TABLET | Freq: Every evening | ORAL | Status: DC | PRN

## 2023-01-07 MED ORDER — PROTHROMBIN COMPLEX CONC HUMAN 500 UNITS IV KIT
3097.0000 [IU] | PACK | Status: AC
  Administered 2023-01-07: 3097 [IU] via INTRAVENOUS
  Filled 2023-01-07: qty 3097

## 2023-01-07 MED ORDER — PANTOPRAZOLE SODIUM 40 MG IV SOLR
40.0000 mg | INTRAVENOUS | Status: AC
  Administered 2023-01-07 (×2): 40 mg via INTRAVENOUS
  Filled 2023-01-07 (×2): qty 10

## 2023-01-07 MED ORDER — ONDANSETRON HCL 4 MG PO TABS: 4.0000 mg | ORAL_TABLET | Freq: Four times a day (QID) | ORAL | Status: DC | PRN

## 2023-01-07 MED ORDER — ATORVASTATIN CALCIUM 20 MG PO TABS
20.0000 mg | ORAL_TABLET | Freq: Every day | ORAL | Status: DC
  Administered 2023-01-07 – 2023-01-08 (×2): 20 mg via ORAL
  Filled 2023-01-07: qty 2
  Filled 2023-01-07: qty 1

## 2023-01-07 NOTE — ED Notes (Signed)
Pt's SPO2 dropping to 86% while resting. Pt placed on 2L [place].

## 2023-01-07 NOTE — ED Provider Notes (Signed)
Spoke with Dr. Jacqulyn Bath who accepted patient ED to ED since no step down beds available at this time and hemoglobin on recheck was almost 1 gram lower.  Type and cross ordered as well as first 2 units so that patient may begin to start getting blood.  Will need to hold Eliquis in the setting of active GI bleeding Drs. Loney Loh and Marca Ancona informed of transfer via secure chat.     Nichoel Digiulio, MD 01/07/23 [phone]

## 2023-01-07 NOTE — ED Notes (Signed)
MD notified of Hgb 5.0.

## 2023-01-07 NOTE — ED Notes (Signed)
Care link at bedside packaging pt.

## 2023-01-07 NOTE — Consult Note (Signed)
[facility] Consult  Referring Provider: Triad Foster Primary Care Physician:  Stephanie Foster, [place] Primary Gastroenterologist: Stephanie [facility]  Reason for Consultation: Melena  HPI: Stephanie Foster is a 89 y.o. female came to the ER with weakness and dizziness.  Patient states she thought she had food poisoning as she was nauseous, experienced generalized abdominal discomfort and came to the ER where she was found to have a hemoglobin of 5. Patient states that her stools have always been black as she takes oral iron and did not think that she was bleeding.  She denies noticing bright red blood per rectum.  She takes Eliquis for atrial fibrillation.  Her son, Stephanie Foster was present at bedside in ER to provide more history. Patient has not had unintentional weight loss, change in bowel habits, acid reflux, heartburn, difficulty swallowing or pain on swallowing.  Prior GI workup: Outside procedures- EGD 10/15/2019: Normal esophagus and stomach, abnormal mucosa of duodenum, biopsies negative for celiac Colonoscopy 10/15/2019: Severe diverticulosis in left colon, 3 polyps removed from cecum, 1 polyp removed from descending, 1 polyp removed from sigmoid, all polyps reported as tubular adenomas, repeat not recommended due to age, patient noted to have internal hemorrhoids   Past Medical History:  Diagnosis Date   Cataract    Chest pain 03/24/2012   Diaphoresis 03/24/2012   Diverticulosis    Dry eye syndrome of both lacrimal glands 11/16/2017   Dyslipidemia 02/28/2020   Dyspnea on exertion 02/28/2020   Essential hypertension 02/28/2020   GERD (gastroesophageal reflux disease)    H/O hiatal hernia    Hernia    Hypertension    Late effect of cerebrovascular accident (CVA) 02/28/2020   Nausea 03/24/2012   Nonexudative age-related macular degeneration, bilateral, early dry stage 11/16/2017   Pseudophakia of both eyes 11/16/2017   Stroke (HCC) 02/22/2020    Past Surgical History:   Procedure Laterality Date   ABDOMINAL HYSTERECTOMY     BLADDER SUSPENSION     EYE SURGERY     TONSILLECTOMY      Prior to Admission medications   Medication Sig Start Date End Date Taking? Authorizing Provider  atorvastatin (LIPITOR) 20 MG tablet Take 20 mg by mouth daily.   Yes [provider]  Cranberry-Vitamin C-Vitamin E 4200-20-3 MG-MG-UNIT CAPS Take 1 tablet by mouth daily.   Yes [provider]  diltiazem (CARDIZEM CD) 120 MG 24 hr capsule Take 1 capsule (120 mg total) by mouth 2 (two) times daily. Patient taking differently: Take 120 mg by mouth in the morning and at bedtime. 05/24/22  Yes [name], MD  ELIQUIS 5 MG TABS tablet TAKE 1 TABLET(5 MG) BY MOUTH TWICE DAILY Patient taking differently: Take 5 mg by mouth 2 (two) times daily. 10/15/22  Yes [name], MD  iron polysaccharides (NIFEREX) 150 MG capsule Take 150 mg by mouth every other day.   Yes [provider]  Vitamin D, Ergocalciferol, (DRISDOL) 1.25 MG (50000 UNIT) CAPS capsule Take 50,000 Units by mouth once a week. 01/03/23  Yes [provider]  Ferrous Fumarate (HEMOCYTE - 106 MG FE) 324 (106 Fe) MG TABS tablet Take 3 tablets (318 mg of iron total) by mouth daily with breakfast. Patient not taking: Reported on 01/07/2023 05/24/22   [name], MD  levothyroxine (SYNTHROID) 50 MCG tablet Take 50 mcg by mouth daily before breakfast. Patient not taking: Reported on 01/07/2023    [provider]    Current Facility-Administered Medications  Medication Dose  Route Frequency Provider Last Rate Last Admin   0.9 %  sodium chloride infusion (Manually program via Guardrails IV Fluids)   Intravenous Once Palumbo, April, MD       acetaminophen (TYLENOL) tablet 650 mg  650 mg Oral Q6H PRN Kirby Crigler, Mir M, MD       Or   acetaminophen (TYLENOL) suppository 650 mg  650 mg Rectal Q6H PRN Kirby Crigler, Mir M, MD       albuterol (PROVENTIL) (2.5 MG/3ML) 0.083%  nebulizer solution 2.5 mg  2.5 mg Nebulization Q2H PRN Kirby Crigler, Mir M, MD       atorvastatin (LIPITOR) tablet 20 mg  20 mg Oral Daily Kirby Crigler, Mir M, MD       ondansetron [facility]) 4 MG/2ML injection            ondansetron (ZOFRAN) tablet 4 mg  4 mg Oral Q6H PRN Kirby Crigler, Mir M, MD       Or   ondansetron [facility]) injection 4 mg  4 mg Intravenous Q6H PRN Kirby Crigler, Mir M, MD       pantoprazole (PROTONIX) injection 40 mg  40 mg Intravenous Q12H Palumbo, April, MD       prothrombin complex conc human (KCENTRA) injection            prothrombin complex conc human (KCENTRA) injection            traZODone (DESYREL) tablet 25 mg  25 mg Oral QHS PRN Kirby Crigler, Mir M, MD       Current Outpatient Medications  Medication Sig Dispense Refill   atorvastatin (LIPITOR) 20 MG tablet Take 20 mg by mouth daily.     Cranberry-Vitamin C-Vitamin E 4200-20-3 MG-MG-UNIT CAPS Take 1 tablet by mouth daily.     diltiazem (CARDIZEM CD) 120 MG 24 hr capsule Take 1 capsule (120 mg total) by mouth 2 (two) times daily. (Patient taking differently: Take 120 mg by mouth in the morning and at bedtime.) 180 capsule 2   ELIQUIS 5 MG TABS tablet TAKE 1 TABLET(5 MG) BY MOUTH TWICE DAILY (Patient taking differently: Take 5 mg by mouth 2 (two) times daily.) 180 tablet 1   iron polysaccharides (NIFEREX) 150 MG capsule Take 150 mg by mouth every other day.     Vitamin D, Ergocalciferol, (DRISDOL) 1.25 MG (50000 UNIT) CAPS capsule Take 50,000 Units by mouth once a week.     Ferrous Fumarate (HEMOCYTE - 106 MG FE) 324 (106 Fe) MG TABS tablet Take 3 tablets (318 mg of iron total) by mouth daily with breakfast. (Patient not taking: Reported on 01/07/2023) 90 tablet 2   levothyroxine (SYNTHROID) 50 MCG tablet Take 50 mcg by mouth daily before breakfast. (Patient not taking: Reported on 01/07/2023)      Allergies as of 01/06/2023 - Review Complete 12/21/2022  Allergen Reaction Noted   Ciprofloxacin Hives and Itching 03/29/2016    Dextromethorphan-guaifenesin Other (See Comments) 03/31/2015   Aspartame and phenylalanine  03/24/2012   Codeine Nausea Only 03/24/2012   Diovan [valsartan]  03/24/2012   Flagyl [metronidazole] Diarrhea and Nausea And Vomiting 03/24/2012   Macrobid [nitrofurantoin macrocrystal] Nausea Only 03/24/2012   Phenylalanine Other (See Comments) 03/24/2012   Prednisone  03/24/2012   Sulfasalazine Other (See Comments) 03/24/2012   Vitasana [actical]  03/24/2012   Aspirin Palpitations 03/24/2012   Doxycycline Other (See Comments) 03/17/2016   Sulfa antibiotics Nausea Only and Other (See Comments) 03/24/2012    Family History  Problem Relation Age of Onset   Coronary artery disease Father  Coronary artery disease Brother     Social History   Socioeconomic History   Marital status: Widowed    Spouse name: Not on file   Number of children: Not on file   Years of education: Not on file   Highest education level: Not on file  Occupational History   Not on file  Tobacco Use   Smoking status: Never   Smokeless tobacco: Never  Substance and Sexual Activity   Alcohol use: No   Drug use: No   Sexual activity: Not on file  Other Topics Concern   Not on file  Social History Narrative   Not on file   Social Determinants of Health   Financial Resource Strain: Not on file  Food Insecurity: Not on file  Transportation Needs: Not on file  Physical Activity: Not on file  Stress: Not on file  Social Connections: Not on file  Intimate Partner Violence: Not on file    Review of Systems: As per HPI  Physical Exam: Vital signs in last 24 hours: Temp:  [99.2 F (37.3 C)-99.3 F (37.4 C)] 99.2 F (37.3 C) (11/22 0436) Pulse Rate:  [77-100] 78 (11/22 0700) Resp:  [17-23] 19 (11/22 0700) BP: (110-145)/(48-86) 119/49 (11/22 0700) SpO2:  [86 %-99 %] 99 % (11/22 0700) FiO2 (%):  [100 %] 100 % (11/22 0158) Weight:  [66.2 kg] 66.2 kg (11/22 0003)    General:   Hard of hearing  Head:   Normocephalic and atraumatic. Eyes:  Sclera clear, no icterus.  Prominent pallor. Ears:  Normal auditory acuity. Nose:  No deformity, discharge,  or lesions. Mouth:  No deformity or lesions.  Oropharynx pink & moist. Neck:  Supple; no masses or thyromegaly. Lungs:  Clear throughout to auscultation.   No wheezes, crackles, or rhonchi. No acute distress. Heart:  Regular rate and rhythm; no murmurs, clicks, rubs,  or gallops. Extremities:  Without clubbing or edema. Neurologic:  Alert and  oriented x4;  grossly normal neurologically. Skin:  Intact without significant lesions or rashes. Psych:  Alert and cooperative. Normal mood and affect. Abdomen:  Soft, nontender and nondistended. No masses, hepatosplenomegaly or hernias noted. Normal bowel sounds, without guarding, and without rebound.     Rectal exam by ER physician: Guaiac positive stool reported     Lab Results: Recent Labs    01/07/23 0004 01/07/23 0453  WBC 8.9 8.8  HGB 5.9* 5.0*  HCT 21.2* 18.0*  PLT 235 206   BMET Recent Labs    01/07/23 0004  NA 135  K 4.0  CL 103  CO2 24  GLUCOSE 143*  BUN 24*  CREATININE 1.05*  CALCIUM 8.9   LFT Recent Labs    01/07/23 0004  PROT 5.8*  ALBUMIN 3.3*  AST 17  ALT 12  ALKPHOS 90  BILITOT 0.8   PT/INR Recent Labs    01/07/23 0739  LABPROT 14.9  INR 1.2    Studies/Results: CT ABDOMEN PELVIS W CONTRAST  Result Date: 01/07/2023 CLINICAL DATA:  Abdominal pain, acute, nonlocalized. EXAM: CT ABDOMEN AND PELVIS WITH CONTRAST TECHNIQUE: Multidetector CT imaging of the abdomen and pelvis was performed using the standard protocol following bolus administration of intravenous contrast. RADIATION DOSE REDUCTION: This exam was performed according to the departmental dose-optimization program which includes automated exposure control, adjustment of the mA and/or kV according to patient size and/or use of iterative reconstruction technique. CONTRAST:  OMNIPAQUE IOHEXOL 300  MG/ML  SOLN COMPARISON:  10/15/2016. FINDINGS: Lower chest: Atelectasis is  present at the lung bases. There is a 6 mm nodule in the right lower lobe, axial image 3. Hepatobiliary: A 9 mm hypodensity is noted in the left lobe of the liver, likely cyst or hemangioma. No biliary ductal dilatation. Gallbladder is without stones. Pancreas: Unremarkable. No pancreatic ductal dilatation or surrounding inflammatory changes. Spleen: Normal in size without focal abnormality. Adrenals/Urinary Tract: The adrenal glands are within normal limits. Right renal atrophy with cortical scarring is noted. Renal calculi are noted on the left. Parapelvic cysts are present on the left. No ureteral calculus or obstructive uropathy bilaterally. The bladder is unremarkable. Stomach/Bowel: There is a large hiatal hernia in the stomach is intrathoracic in location. No bowel obstruction, free air, or pneumatosis. Scattered diverticula are present along the colon without evidence of diverticulitis. Normal appendix is seen in the right lower quadrant. Vascular/Lymphatic: Aortic atherosclerosis. No enlarged abdominal or pelvic lymph nodes. Reproductive: Status post hysterectomy. No adnexal masses. Other: No abdominopelvic ascites. Musculoskeletal: Degenerative changes are present in the thoracolumbar spine. Pectus excavatum deformity is noted at the chest. No acute osseous abnormality. IMPRESSION: 1. No acute intra-abdominal process. 2. Large hiatal hernia with the stomach intrathoracic in location. 3. Diverticulosis without diverticulitis. 4. 6 mm right lower lobe pulmonary nodule. Non-contrast chest CT at 6-12 months is recommended. If the nodule is stable at time of repeat CT, then future CT at 18-24 months (from today's scan) is considered optional for low-risk patients, but is recommended for high-risk patients. This recommendation follows the consensus statement: Guidelines for Management of Incidental Pulmonary Nodules Detected on CT Images:  From the Fleischner Society 2017; Radiology 2017; 284:228-243. 5. Aortic atherosclerosis. Electronically Signed   By: Thornell Sartorius M.D.   On: 01/07/2023 02:04   DG Chest Portable 1 View  Result Date: 01/07/2023 CLINICAL DATA:  Nausea EXAM: PORTABLE CHEST 1 VIEW COMPARISON:  09/28/2021 FINDINGS: Cardiac shadow is within normal limits. Large hiatal hernia is noted. Aortic calcifications are seen. Lungs are well aerated bilaterally. No bony abnormality is noted. IMPRESSION: No acute abnormality noted. Electronically Signed   By: Alcide Clever M.D.   On: 01/07/2023 00:54    Impression: Symptomatic anemia, black stools Hemoglobin 5/5.9 Orders have been placed for 2 unit PRBC transfusion  CT abdomen and pelvis with contrast: Large hiatal hernia with the stomach intrathoracic in location, diverticulosis, 6 mm right lower pulmonary nodule, no acute intra-abdominal process   Severe microcytosis, MCV 67.4 signifying chronic blood loss BUN 24/creatinine 1.05 with a GFR of 49 Low albumin 3.3, low total protein 5.8  Atrial fibrillation, on Eliquis, last dose yesterday  Plan: Discussed with patient and her son at bedside that black stools usually signify upper GI bleed. Patient does not want endoscopic procedures. I discussed with the patient and her son that I cannot resume Eliquis without identifying a source of bleeding and without Eliquis she remains at risk of stroke. Patient understands and does not want to proceed with any endoscopic procedures. It is possible that because of large hiatal hernia, patient has had chronic, occult blood loss, worsened by anticoagulation and always remains at risk of anemia. I will start the patient on clear liquid diet with the intention to advance her diet if there is no signs of hemodynamic instability. Patient and her son are agreeable to holding Eliquis completely and managing patient conservatively. She is on pantoprazole 40 mg every 12 hours, when she is  ready for discharge I recommend that patient stays on pantoprazole 40 mg once a day  indefinitely.   LOS: 0 days   Kerin Salen, MD  01/07/2023, 8:44 AM

## 2023-01-07 NOTE — ED Notes (Signed)
Gold, Dark green, and light blue tubes being held in the lab

## 2023-01-07 NOTE — ED Notes (Signed)
Report called to [place], Consulting civil engineer @[email or web address].

## 2023-01-07 NOTE — ED Notes (Signed)
Lab called and informed the troponin will not result for 1 hour and  30 minutes due to maintenance. MD notified.

## 2023-01-07 NOTE — ED Notes (Signed)
ED TO INPATIENT HANDOFF REPORT  Name/Age/Gender Stephanie Foster 89 y.o. female  Code Status    Code Status Orders  (From admission, onward)           Start     Ordered   01/07/23 0725  Full code  Continuous       Question:  By:  Answer:  Consent: discussion documented in EHR   01/07/23 0725           Code Status History     Date Active Date Inactive Code Status Order ID Comments User Context   02/22/2020 1427 02/24/2020 2346 Full Code [id number]  Jaci Standard, DO ED   03/24/2012 1444 03/25/2012 1757 Full Code [id number]  Cristal Ford, MD Inpatient       Home/SNF/Other Home  Chief Complaint Acute GI bleeding [K92.2]  Level of Care/Admitting Diagnosis ED Disposition     ED Disposition  Admit   Condition  --   Comment  Hospital Area: [facility] [[id number]]  Level of Care: Progressive [102]  Admit to Progressive based on following criteria: GI, ENDOCRINE disease patients with GI bleeding, acute liver failure or pancreatitis, stable with diabetic ketoacidosis or thyrotoxicosis (hypothyroid) state.  Interfacility transfer: Yes  May place patient in observation at [facility] or [facility] if equivalent level of care is available:: Yes  Covid Evaluation: Asymptomatic - no recent exposure (last 10 days) testing not required  Diagnosis: Acute GI bleeding [253168]  Admitting Physician: John Giovanni [[id number]]  Attending Physician: John Giovanni [[id number]]          Medical History Past Medical History:  Diagnosis Date   Cataract    Chest pain 03/24/2012   Diaphoresis 03/24/2012   Diverticulosis    Dry eye syndrome of both lacrimal glands 11/16/2017   Dyslipidemia 02/28/2020   Dyspnea on exertion 02/28/2020   Essential hypertension 02/28/2020   GERD (gastroesophageal reflux disease)    H/O hiatal hernia    Hernia    Hypertension    Late effect of cerebrovascular accident (CVA) 02/28/2020   Nausea 03/24/2012   Nonexudative age-related  macular degeneration, bilateral, early dry stage 11/16/2017   Pseudophakia of both eyes 11/16/2017   Stroke (HCC) 02/22/2020    Allergies Allergies  Allergen Reactions   Ciprofloxacin Hives and Itching   Dextromethorphan-Guaifenesin Other (See Comments)    Muscle Weakness   Aspartame And Phenylalanine Other (See Comments)    "heart felt like swelling, top of head felt like blood pressure coming out of top"   Codeine Nausea Only   Diovan [Valsartan] Other (See Comments)    "makes me weak"   Flagyl [Metronidazole] Diarrhea and Nausea And Vomiting   Macrobid [Nitrofurantoin Macrocrystal] Nausea Only   Phenylalanine Other (See Comments)    "heart felt like swelling, top of head felt like blood pressure coming out of top"   Prednisone Other (See Comments)    "makes me feel so weak that I can't walk across the floor"   Sulfasalazine Nausea Only   Vitasana [Actical] Other (See Comments)    "tired, arms and legs felt like going down"   Aspirin Palpitations   Doxycycline Other (See Comments)   Sulfa Antibiotics Nausea Only    IV Location/Drains/Wounds Patient Lines/Drains/Airways Status     Active Line/Drains/Airways     Name Placement date Placement time Site Days   Peripheral IV 01/07/23 20 G Anterior;Proximal;Right Antecubital 01/07/23  0038  Antecubital  less than 1   Peripheral IV 01/07/23  20 G Anterior;Left;Proximal Antecubital 01/07/23  0157  Antecubital  less than 1   Urethral Catheter Louann Liv, RN Non-latex 14 Fr. 01/07/23  0218  Non-latex  less than 1            Labs/Imaging Results for orders placed or performed during the hospital encounter of 01/07/23 (from the past 48 hour(s))  Lipase, blood     Status: None   Collection Time: 01/07/23 12:04 AM  Result Value Ref Range   Lipase 32 11 - 51 U/L    Comment: Performed at [facility], [address] [place], [place] [address]  Comprehensive metabolic panel     Status: Abnormal   Collection Time:  01/07/23 12:04 AM  Result Value Ref Range   Sodium 135 135 - 145 mmol/L   Potassium 4.0 3.5 - 5.1 mmol/L   Chloride 103 98 - 111 mmol/L   CO2 24 22 - 32 mmol/L   Glucose, Bld 143 (H) 70 - 99 mg/dL    Comment: Glucose reference range applies only to samples taken after fasting for at least 8 hours.   BUN 24 (H) 8 - 23 mg/dL   Creatinine, Ser 6.04 (H) 0.44 - 1.00 mg/dL   Calcium 8.9 8.9 - 54.0 mg/dL   Total Protein 5.8 (L) 6.5 - 8.1 g/dL   Albumin 3.3 (L) 3.5 - 5.0 g/dL   AST 17 15 - 41 U/L   ALT 12 0 - 44 U/L   Alkaline Phosphatase 90 38 - 126 U/L   Total Bilirubin 0.8 <1.2 mg/dL   GFR, Estimated 49 (L) >60 mL/min    Comment: (NOTE) Calculated using the CKD-EPI Creatinine Equation (2021)    Anion gap 8 5 - 15    Comment: Performed at [facility], [address] [place], [place] [address]  CBC     Status: Abnormal   Collection Time: 01/07/23 12:04 AM  Result Value Ref Range   WBC 8.9 4.0 - 10.5 K/uL   RBC 3.11 (L) 3.87 - 5.11 MIL/uL   Hemoglobin 5.9 (LL) 12.0 - 15.0 g/dL    Comment: REPEATED TO VERIFY Reticulocyte Hemoglobin testing may be clinically indicated, consider ordering this additional test [id number] THIS CRITICAL RESULT HAS VERIFIED AND BEEN CALLED TO ALFRED DILLARD,RN BY [employer] ON 11 22 2024 AT 0134, AND HAS BEEN READ BACK. RBV    HCT 21.2 (L) 36.0 - 46.0 %   MCV 68.2 (L) 80.0 - 100.0 fL   MCH 19.0 (L) 26.0 - 34.0 pg   MCHC 27.8 (L) 30.0 - 36.0 g/dL   RDW 21.3 (H) 08.6 - 57.8 %   Platelets 235 150 - 400 K/uL   nRBC 0.0 0.0 - 0.2 %    Comment: Performed at [facility], [address] [place], [place] [address]  Troponin I (High Sensitivity)     Status: None   Collection Time: 01/07/23 12:32 AM  Result Value Ref Range   Troponin I (High Sensitivity) 9 <18 ng/L    Comment: (NOTE) Elevated high sensitivity troponin I (hsTnI) values and significant  changes across serial measurements may suggest ACS but many other  chronic and  acute conditions are known to elevate hsTnI results.  Refer to the "Links" section for chest pain algorithms and additional  guidance. Performed at [facility], [address] [place], [place] [address]   Occult blood card to lab, stool     Status: Abnormal  Collection Time: 01/07/23  1:51 AM  Result Value Ref Range   Fecal Occult Bld POSITIVE (A) NEGATIVE    Comment: Performed at [facility], [address] [place], [place] [address]  Urinalysis, Routine w reflex microscopic -Urine, Clean Catch     Status: Abnormal   Collection Time: 01/07/23  2:13 AM  Result Value Ref Range   Color, Urine YELLOW YELLOW   APPearance CLEAR CLEAR   Specific Gravity, Urine 1.015 1.005 - 1.030   pH 7.0 5.0 - 8.0   Glucose, UA NEGATIVE NEGATIVE mg/dL   Hgb urine dipstick TRACE (A) NEGATIVE   Bilirubin Urine NEGATIVE NEGATIVE   Ketones, ur NEGATIVE NEGATIVE mg/dL   Protein, ur NEGATIVE NEGATIVE mg/dL   Nitrite NEGATIVE NEGATIVE   Leukocytes,Ua NEGATIVE NEGATIVE    Comment: Performed at [facility], [address] [place], [place] [address]  Urinalysis, Microscopic (reflex)     Status: Abnormal   Collection Time: 01/07/23  2:13 AM  Result Value Ref Range   RBC / HPF 0-5 0 - 5 RBC/hpf   WBC, UA 0-5 0 - 5 WBC/hpf   Bacteria, UA FEW (A) NONE SEEN   Squamous Epithelial / HPF 0-5 0 - 5 /HPF   Amorphous Crystal PRESENT     Comment: Performed at [facility], [address] [place], [place] [address]  Troponin I (High Sensitivity)     Status: None   Collection Time: 01/07/23  2:53 AM  Result Value Ref Range   Troponin I (High Sensitivity) 12 <18 ng/L    Comment: (NOTE) Elevated high sensitivity troponin I (hsTnI) values and significant  changes across serial measurements may suggest ACS but many other  chronic and acute conditions are known to elevate hsTnI results.  Refer to the "Links" section for chest pain algorithms and additional   guidance. Performed at [facility], [address] [place], [place] [address]   CBC with Differential     Status: Abnormal   Collection Time: 01/07/23  4:53 AM  Result Value Ref Range   WBC 8.8 4.0 - 10.5 K/uL   RBC 2.67 (L) 3.87 - 5.11 MIL/uL   Hemoglobin 5.0 (LL) 12.0 - 15.0 g/dL    Comment: CRITICAL VALUE NOTED.  VALUE IS CONSISTENT WITH PREVIOUSLY REPORTED AND CALLED VALUE. REPEATED TO VERIFY Reticulocyte Hemoglobin testing may be clinically indicated, consider ordering this additional test [id number] THIS CRITICAL RESULT HAS VERIFIED AND BEEN CALLED TO ALICIA JOHNSON,RN BY [employer] ON 11 22 2024 AT 0509, AND HAS BEEN READ BACK.     HCT 18.0 (L) 36.0 - 46.0 %   MCV 67.4 (L) 80.0 - 100.0 fL   MCH 18.7 (L) 26.0 - 34.0 pg   MCHC 27.8 (L) 30.0 - 36.0 g/dL   RDW 35.3 (H) 29.9 - 24.2 %   Platelets 206 150 - 400 K/uL   nRBC 0.3 (H) 0.0 - 0.2 %   Neutrophils Relative % 85 %   Neutro Abs 7.5 1.7 - 7.7 K/uL   Lymphocytes Relative 6 %   Lymphs Abs 0.5 (L) 0.7 - 4.0 K/uL   Monocytes Relative 8 %   Monocytes Absolute 0.7 0.1 - 1.0 K/uL   Eosinophils Relative 0 %   Eosinophils Absolute 0.0 0.0 - 0.5 K/uL   Basophils Relative 0 %   Basophils Absolute 0.0 0.0 - 0.1 K/uL   Immature Granulocytes 1 %   Abs Immature Granulocytes 0.04 0.00 -  0.07 K/uL    Comment: Performed at [facility], [address] [place], [place] [address]  Type and screen [facility]     Status: None (Preliminary result)   Collection Time: 01/07/23  6:30 AM  Result Value Ref Range   ABO/RH(D) O POS    Antibody Screen NEG    Sample Expiration 01/10/2023,2359    Unit Number [id number]    Blood Component Type RED CELLS,LR    Unit division 00    Status of Unit ISSUED    Transfusion Status OK TO TRANSFUSE    Crossmatch Result      Compatible Performed at [facility], [address] [place], [place] [address]    Unit Number  [id number]    Blood Component Type RED CELLS,LR    Unit division 00    Status of Unit ISSUED    Transfusion Status OK TO TRANSFUSE    Crossmatch Result Compatible    Unit Number [id number]    Blood Component Type RED CELLS,LR    Unit division 00    Status of Unit ALLOCATED    Transfusion Status OK TO TRANSFUSE    Crossmatch Result Compatible    Unit Number [id number]    Blood Component Type RBC LR PHER2    Unit division 00    Status of Unit ALLOCATED    Transfusion Status OK TO TRANSFUSE    Crossmatch Result Compatible   Prepare RBC (crossmatch)     Status: None   Collection Time: 01/07/23  6:30 AM  Result Value Ref Range   Order Confirmation      ORDER PROCESSED BY BLOOD BANK Performed at [facility], [address] [place], [place] [address]   Protime-INR     Status: None   Collection Time: 01/07/23  7:39 AM  Result Value Ref Range   Prothrombin Time 14.9 11.4 - 15.2 seconds   INR 1.2 0.8 - 1.2    Comment: (NOTE) INR goal varies based on device and disease states. Performed at [facility], [address], [place], [place] [address]   ABO/Rh     Status: None   Collection Time: 01/07/23  7:39 AM  Result Value Ref Range   ABO/RH(D)      O POS Performed at [facility], [address], [place], [place] [address]   Hemoglobin and hematocrit, blood     Status: Abnormal   Collection Time: 01/07/23  8:37 AM  Result Value Ref Range   Hemoglobin 5.2 (LL) 12.0 - 15.0 g/dL    Comment: REPEATED TO VERIFY THIS CRITICAL RESULT HAS VERIFIED AND BEEN CALLED TO DOWD, P BY LINDSAY,CELESTE ON 11 22 2024 AT 0925, AND HAS BEEN READ BACK.     HCT 18.9 (L) 36.0 - 46.0 %    Comment: Performed at [facility], [address], [place], [place] [address]  Hemoglobin and hematocrit, blood     Status: Abnormal   Collection Time: 01/07/23  4:00 PM  Result Value Ref Range   Hemoglobin 8.1 (L) 12.0 - 15.0  g/dL    Comment: POST TRANSFUSION SPECIMEN DELTA CHECK NOTED    HCT 26.5 (L) 36.0 - 46.0 %    Comment: Performed at [facility], [address] [place], [place] [address]   CT ABDOMEN PELVIS W CONTRAST  Result Date: 01/07/2023 CLINICAL DATA:  Abdominal pain, acute, nonlocalized. EXAM: CT ABDOMEN AND PELVIS WITH  CONTRAST TECHNIQUE: Multidetector CT imaging of the abdomen and pelvis was performed using the standard protocol following bolus administration of intravenous contrast. RADIATION DOSE REDUCTION: This exam was performed according to the departmental dose-optimization program which includes automated exposure control, adjustment of the mA and/or kV according to patient size and/or use of iterative reconstruction technique. CONTRAST:  OMNIPAQUE IOHEXOL 300 MG/ML  SOLN COMPARISON:  10/15/2016. FINDINGS: Lower chest: Atelectasis is present at the lung bases. There is a 6 mm nodule in the right lower lobe, axial image 3. Hepatobiliary: A 9 mm hypodensity is noted in the left lobe of the liver, likely cyst or hemangioma. No biliary ductal dilatation. Gallbladder is without stones. Pancreas: Unremarkable. No pancreatic ductal dilatation or surrounding inflammatory changes. Spleen: Normal in size without focal abnormality. Adrenals/Urinary Tract: The adrenal glands are within normal limits. Right renal atrophy with cortical scarring is noted. Renal calculi are noted on the left. Parapelvic cysts are present on the left. No ureteral calculus or obstructive uropathy bilaterally. The bladder is unremarkable. Stomach/Bowel: There is a large hiatal hernia in the stomach is intrathoracic in location. No bowel obstruction, free air, or pneumatosis. Scattered diverticula are present along the colon without evidence of diverticulitis. Normal appendix is seen in the right lower quadrant. Vascular/Lymphatic: Aortic atherosclerosis. No enlarged abdominal or pelvic lymph nodes. Reproductive:  Status post hysterectomy. No adnexal masses. Other: No abdominopelvic ascites. Musculoskeletal: Degenerative changes are present in the thoracolumbar spine. Pectus excavatum deformity is noted at the chest. No acute osseous abnormality. IMPRESSION: 1. No acute intra-abdominal process. 2. Large hiatal hernia with the stomach intrathoracic in location. 3. Diverticulosis without diverticulitis. 4. 6 mm right lower lobe pulmonary nodule. Non-contrast chest CT at 6-12 months is recommended. If the nodule is stable at time of repeat CT, then future CT at 18-24 months (from today's scan) is considered optional for low-risk patients, but is recommended for high-risk patients. This recommendation follows the consensus statement: Guidelines for Management of Incidental Pulmonary Nodules Detected on CT Images: From the Fleischner Society 2017; Radiology 2017; 284:228-243. 5. Aortic atherosclerosis. Electronically Signed   By: Thornell Sartorius M.D.   On: 01/07/2023 02:04   DG Chest Portable 1 View  Result Date: 01/07/2023 CLINICAL DATA:  Nausea EXAM: PORTABLE CHEST 1 VIEW COMPARISON:  09/28/2021 FINDINGS: Cardiac shadow is within normal limits. Large hiatal hernia is noted. Aortic calcifications are seen. Lungs are well aerated bilaterally. No bony abnormality is noted. IMPRESSION: No acute abnormality noted. Electronically Signed   By: Alcide Clever M.D.   On: 01/07/2023 00:54    Pending Labs Unresulted Labs (From admission, onward)     Start     Ordered   01/08/23 0500  Basic metabolic panel  Tomorrow morning,   R        01/07/23 0725   01/08/23 0500  CBC  Tomorrow morning,   R        01/07/23 0725   01/07/23 0726  Hemoglobin and hematocrit, blood  Now then every 8 hours,   R (with TIMED occurrences)      01/07/23 0725            Vitals/Pain Today's Vitals   01/07/23 1407 01/07/23 1700 01/07/23 1800 01/07/23 1900  BP: (!) 105/49 96/86 (!) 101/55 114/79  Pulse: 74 69 70 67  Resp: 16 (!) 21 19 (!) 21   Temp: 99.1 F (37.3 C)  99.4 F (37.4 C)   TempSrc: Oral  Oral   SpO2: 98% 98% 98%  97%  Weight:      Height:      PainSc:        Isolation Precautions No active isolations  Medications Medications  pantoprazole (PROTONIX) injection 40 mg (40 mg Intravenous Given 01/07/23 0212)    Followed by  pantoprazole (PROTONIX) injection 40 mg (40 mg Intravenous Given 01/07/23 1419)  prothrombin complex conc human (KCENTRA) injection (  See Procedure Record 01/07/23 0248)  prothrombin complex conc human (KCENTRA) injection (  See Procedure Record 01/07/23 0249)  ondansetron (ZOFRAN) 4 MG/2ML injection (  Not Given 01/07/23 0551)  atorvastatin (LIPITOR) tablet 20 mg (20 mg Oral Given 01/07/23 1050)  acetaminophen (TYLENOL) tablet 650 mg (has no administration in time range)    Or  acetaminophen (TYLENOL) suppository 650 mg (has no administration in time range)  traZODone (DESYREL) tablet 25 mg (has no administration in time range)  ondansetron (ZOFRAN) tablet 4 mg ( Oral See Alternative 01/07/23 1022)    Or  ondansetron (ZOFRAN) injection 4 mg (4 mg Intravenous Given 01/07/23 1022)  albuterol (PROVENTIL) (2.5 MG/3ML) 0.083% nebulizer solution 2.5 mg (has no administration in time range)  polyethylene glycol (MIRALAX / GLYCOLAX) packet 17 g (17 g Oral Given 01/07/23 1247)  ondansetron (ZOFRAN) injection 4 mg (0 mg Intravenous Not Given 01/07/23 0047)  famotidine (PEPCID) IVPB 20 mg premix (0 mg Intravenous Stopped 01/07/23 0217)  iohexol (OMNIPAQUE) 300 MG/ML solution 100 mL (100 mLs Intravenous Contrast Given 01/07/23 0133)  prothrombin complex conc human (KCENTRA) IVPB 3,097 Units (0 Units Intravenous Stopped 01/07/23 0321)  ondansetron (ZOFRAN) injection 4 mg (4 mg Intravenous Given 01/07/23 0551)  0.9 %  sodium chloride infusion (Manually program via Guardrails IV Fluids) (0 mLs Intravenous Stopped 01/07/23 1406)    Mobility walks with device

## 2023-01-07 NOTE — ED Provider Notes (Addendum)
[facility] Provider Note   CSN: [id number] Arrival date & time: 01/06/23  2353     History  Chief Complaint  Patient presents with   Abdominal Pain   Nausea    Stephanie Foster is a 89 y.o. female.  The history is provided by the patient and the spouse.  Abdominal Pain Pain location:  Epigastric (into the chest) Pain quality comment:  "indigestion" with nausea Pain radiates to:  Chest Pain severity:  Severe Onset quality:  Gradual Duration:  1 day Timing:  Constant Progression:  Worsening Chronicity:  New (has had a week of "indigestion" and has had black stools that she was told were from Eliquis) Context: eating   Context: not trauma   Associated symptoms: constipation and nausea   Associated symptoms: no dysuria and no fever   Patient with hiatal hernia, gerd and HTN on Eliquis (last dose in am) presents with 1 week "ingestion".  She ate part of a taco salad on Wednesday and then another part on Thursday and then had worsening pain and nausea.  Has had black stools but states she was told that is a side effect of the Eliquis.  No diarrhea or constipation.  No urinary symptoms.      Past Medical History:  Diagnosis Date   Cataract    Chest pain 03/24/2012   Diaphoresis 03/24/2012   Diverticulosis    Dry eye syndrome of both lacrimal glands 11/16/2017   Dyslipidemia 02/28/2020   Dyspnea on exertion 02/28/2020   Essential hypertension 02/28/2020   GERD (gastroesophageal reflux disease)    H/O hiatal hernia    Hernia    Hypertension    Late effect of cerebrovascular accident (CVA) 02/28/2020   Nausea 03/24/2012   Nonexudative age-related macular degeneration, bilateral, early dry stage 11/16/2017   Pseudophakia of both eyes 11/16/2017   Stroke (HCC) 02/22/2020     Home Medications Prior to Admission medications   Medication Sig Start Date End Date Taking? Authorizing Provider  atorvastatin (LIPITOR) 20 MG tablet Take 20 mg by  mouth daily.    [provider]  Cranberry-Vitamin C-Vitamin E 4200-20-3 MG-MG-UNIT CAPS Take 1 tablet by mouth 2 (two) times daily.    [provider]  diltiazem (CARDIZEM CD) 120 MG 24 hr capsule Take 1 capsule (120 mg total) by mouth 2 (two) times daily. Patient taking differently: Take 120 mg by mouth once a week. On Saturday's 05/24/22   [name], MD  ELIQUIS 5 MG TABS tablet TAKE 1 TABLET(5 MG) BY MOUTH TWICE DAILY Patient taking differently: Take 5 mg by mouth 2 (two) times daily. 10/15/22   [name], MD  Ferrous Fumarate (HEMOCYTE - 106 MG FE) 324 (106 Fe) MG TABS tablet Take 3 tablets (318 mg of iron total) by mouth daily with breakfast. Patient taking differently: Take 324 mg of iron by mouth every other day. 05/24/22   [name], MD      Allergies    Ciprofloxacin, Dextromethorphan-guaifenesin, Aspartame and phenylalanine, Codeine, Diovan [valsartan], Flagyl [metronidazole], Macrobid [nitrofurantoin macrocrystal], Phenylalanine, Prednisone, Sulfasalazine, Vitasana [actical], Aspirin, Doxycycline, and Sulfa antibiotics    Review of Systems   Review of Systems  Unable to perform ROS: Acuity of condition  Constitutional:  Negative for fever.  HENT:  Negative for facial swelling.   Gastrointestinal:  Positive for abdominal distention, abdominal pain, constipation and nausea.       Black stool   Genitourinary:  Negative for  dysuria.    Physical Exam Updated Vital Signs BP (!) 145/86   Pulse 88   Temp 99.3 F (37.4 C) (Oral)   Resp 20   Ht 5' (1.524 m)   Wt 66.2 kg   SpO2 97%   BMI 28.50 kg/m  Physical Exam Vitals and nursing note reviewed. Exam conducted with a chaperone present.  Constitutional:      General: She is not in acute distress.    Appearance: Normal appearance. She is well-developed.  HENT:     Head: Normocephalic and atraumatic.     Nose: Nose normal.  Eyes:     Pupils: Pupils are equal, round, and reactive to  light.  Cardiovascular:     Rate and Rhythm: Normal rate and regular rhythm.     Pulses: Normal pulses.     Heart sounds: Normal heart sounds.  Pulmonary:     Effort: Pulmonary effort is normal. No respiratory distress.     Breath sounds: Normal breath sounds.  Abdominal:     General: Bowel sounds are normal. There is distension.     Palpations: Abdomen is soft.     Tenderness: There is no abdominal tenderness. There is no guarding or rebound.  Genitourinary:    Rectum: Guaiac result positive.  Musculoskeletal:        General: Normal range of motion.     Cervical back: Normal range of motion and neck supple.  Skin:    General: Skin is warm and dry.     Capillary Refill: Capillary refill takes less than 2 seconds.     Findings: No erythema or rash.  Neurological:     General: No focal deficit present.     Mental Status: She is alert and oriented to person, place, and time.     Deep Tendon Reflexes: Reflexes normal.  Psychiatric:        Mood and Affect: Mood normal.     ED Results / Procedures / Treatments   Labs (all labs ordered are listed, but only abnormal results are displayed) Results for orders placed or performed during the hospital encounter of 01/07/23  Lipase, blood  Result Value Ref Range   Lipase 32 11 - 51 U/L  Comprehensive metabolic panel  Result Value Ref Range   Sodium 135 135 - 145 mmol/L   Potassium 4.0 3.5 - 5.1 mmol/L   Chloride 103 98 - 111 mmol/L   CO2 24 22 - 32 mmol/L   Glucose, Bld 143 (H) 70 - 99 mg/dL   BUN 24 (H) 8 - 23 mg/dL   Creatinine, Ser 4.40 (H) 0.44 - 1.00 mg/dL   Calcium 8.9 8.9 - 34.7 mg/dL   Total Protein 5.8 (L) 6.5 - 8.1 g/dL   Albumin 3.3 (L) 3.5 - 5.0 g/dL   AST 17 15 - 41 U/L   ALT 12 0 - 44 U/L   Alkaline Phosphatase 90 38 - 126 U/L   Total Bilirubin 0.8 <1.2 mg/dL   GFR, Estimated 49 (L) >60 mL/min   Anion gap 8 5 - 15  CBC  Result Value Ref Range   WBC 8.9 4.0 - 10.5 K/uL   RBC 3.11 (L) 3.87 - 5.11 MIL/uL    Hemoglobin 5.9 (LL) 12.0 - 15.0 g/dL   HCT 42.5 (L) 95.6 - 38.7 %   MCV 68.2 (L) 80.0 - 100.0 fL   MCH 19.0 (L) 26.0 - 34.0 pg   MCHC 27.8 (L) 30.0 - 36.0 g/dL   RDW 56.4 (H)  11.5 - 15.5 %   Platelets 235 150 - 400 K/uL   nRBC 0.0 0.0 - 0.2 %  Urinalysis, Routine w reflex microscopic -Urine, Clean Catch  Result Value Ref Range   Color, Urine YELLOW YELLOW   APPearance CLEAR CLEAR   Specific Gravity, Urine 1.015 1.005 - 1.030   pH 7.0 5.0 - 8.0   Glucose, UA NEGATIVE NEGATIVE mg/dL   Hgb urine dipstick TRACE (A) NEGATIVE   Bilirubin Urine NEGATIVE NEGATIVE   Ketones, ur NEGATIVE NEGATIVE mg/dL   Protein, ur NEGATIVE NEGATIVE mg/dL   Nitrite NEGATIVE NEGATIVE   Leukocytes,Ua NEGATIVE NEGATIVE  Occult blood card to lab, stool  Result Value Ref Range   Fecal Occult Bld POSITIVE (A) NEGATIVE  Urinalysis, Microscopic (reflex)  Result Value Ref Range   RBC / HPF 0-5 0 - 5 RBC/hpf   WBC, UA 0-5 0 - 5 WBC/hpf   Bacteria, UA FEW (A) NONE SEEN   Squamous Epithelial / HPF 0-5 0 - 5 /HPF   Amorphous Crystal PRESENT   Troponin I (High Sensitivity)  Result Value Ref Range   Troponin I (High Sensitivity) 9 <18 ng/L   CT ABDOMEN PELVIS W CONTRAST  Result Date: 01/07/2023 CLINICAL DATA:  Abdominal pain, acute, nonlocalized. EXAM: CT ABDOMEN AND PELVIS WITH CONTRAST TECHNIQUE: Multidetector CT imaging of the abdomen and pelvis was performed using the standard protocol following bolus administration of intravenous contrast. RADIATION DOSE REDUCTION: This exam was performed according to the departmental dose-optimization program which includes automated exposure control, adjustment of the mA and/or kV according to patient size and/or use of iterative reconstruction technique. CONTRAST:  OMNIPAQUE IOHEXOL 300 MG/ML  SOLN COMPARISON:  10/15/2016. FINDINGS: Lower chest: Atelectasis is present at the lung bases. There is a 6 mm nodule in the right lower lobe, axial image 3. Hepatobiliary: A 9 mm  hypodensity is noted in the left lobe of the liver, likely cyst or hemangioma. No biliary ductal dilatation. Gallbladder is without stones. Pancreas: Unremarkable. No pancreatic ductal dilatation or surrounding inflammatory changes. Spleen: Normal in size without focal abnormality. Adrenals/Urinary Tract: The adrenal glands are within normal limits. Right renal atrophy with cortical scarring is noted. Renal calculi are noted on the left. Parapelvic cysts are present on the left. No ureteral calculus or obstructive uropathy bilaterally. The bladder is unremarkable. Stomach/Bowel: There is a large hiatal hernia in the stomach is intrathoracic in location. No bowel obstruction, free air, or pneumatosis. Scattered diverticula are present along the colon without evidence of diverticulitis. Normal appendix is seen in the right lower quadrant. Vascular/Lymphatic: Aortic atherosclerosis. No enlarged abdominal or pelvic lymph nodes. Reproductive: Status post hysterectomy. No adnexal masses. Other: No abdominopelvic ascites. Musculoskeletal: Degenerative changes are present in the thoracolumbar spine. Pectus excavatum deformity is noted at the chest. No acute osseous abnormality. IMPRESSION: 1. No acute intra-abdominal process. 2. Large hiatal hernia with the stomach intrathoracic in location. 3. Diverticulosis without diverticulitis. 4. 6 mm right lower lobe pulmonary nodule. Non-contrast chest CT at 6-12 months is recommended. If the nodule is stable at time of repeat CT, then future CT at 18-24 months (from today's scan) is considered optional for low-risk patients, but is recommended for high-risk patients. This recommendation follows the consensus statement: Guidelines for Management of Incidental Pulmonary Nodules Detected on CT Images: From the Fleischner Society 2017; Radiology 2017; 284:228-243. 5. Aortic atherosclerosis. Electronically Signed   By: Thornell Sartorius M.D.   On: 01/07/2023 02:04   DG Chest Portable 1  View  Result Date: 01/07/2023 CLINICAL DATA:  Nausea EXAM: PORTABLE CHEST 1 VIEW COMPARISON:  09/28/2021 FINDINGS: Cardiac shadow is within normal limits. Large hiatal hernia is noted. Aortic calcifications are seen. Lungs are well aerated bilaterally. No bony abnormality is noted. IMPRESSION: No acute abnormality noted. Electronically Signed   By: Alcide Clever M.D.   On: 01/07/2023 00:54     EKG EKG Interpretation Date/Time:  Friday January 07 2023 00:11:51 EST Ventricular Rate:  96 PR Interval:  145 QRS Duration:  78 QT Interval:  367 QTC Calculation: 464 R Axis:   -38  Text Interpretation: Sinus tachycardia Multiple premature complexes, vent & supraven Left axis deviation Low voltage, precordial leads Confirmed by Nicanor Alcon, Juletta Berhe ([id number]) on 01/07/2023 12:59:20 AM  Radiology CT ABDOMEN PELVIS W CONTRAST  Result Date: 01/07/2023 CLINICAL DATA:  Abdominal pain, acute, nonlocalized. EXAM: CT ABDOMEN AND PELVIS WITH CONTRAST TECHNIQUE: Multidetector CT imaging of the abdomen and pelvis was performed using the standard protocol following bolus administration of intravenous contrast. RADIATION DOSE REDUCTION: This exam was performed according to the departmental dose-optimization program which includes automated exposure control, adjustment of the mA and/or kV according to patient size and/or use of iterative reconstruction technique. CONTRAST:  OMNIPAQUE IOHEXOL 300 MG/ML  SOLN COMPARISON:  10/15/2016. FINDINGS: Lower chest: Atelectasis is present at the lung bases. There is a 6 mm nodule in the right lower lobe, axial image 3. Hepatobiliary: A 9 mm hypodensity is noted in the left lobe of the liver, likely cyst or hemangioma. No biliary ductal dilatation. Gallbladder is without stones. Pancreas: Unremarkable. No pancreatic ductal dilatation or surrounding inflammatory changes. Spleen: Normal in size without focal abnormality. Adrenals/Urinary Tract: The adrenal glands are within normal  limits. Right renal atrophy with cortical scarring is noted. Renal calculi are noted on the left. Parapelvic cysts are present on the left. No ureteral calculus or obstructive uropathy bilaterally. The bladder is unremarkable. Stomach/Bowel: There is a large hiatal hernia in the stomach is intrathoracic in location. No bowel obstruction, free air, or pneumatosis. Scattered diverticula are present along the colon without evidence of diverticulitis. Normal appendix is seen in the right lower quadrant. Vascular/Lymphatic: Aortic atherosclerosis. No enlarged abdominal or pelvic lymph nodes. Reproductive: Status post hysterectomy. No adnexal masses. Other: No abdominopelvic ascites. Musculoskeletal: Degenerative changes are present in the thoracolumbar spine. Pectus excavatum deformity is noted at the chest. No acute osseous abnormality. IMPRESSION: 1. No acute intra-abdominal process. 2. Large hiatal hernia with the stomach intrathoracic in location. 3. Diverticulosis without diverticulitis. 4. 6 mm right lower lobe pulmonary nodule. Non-contrast chest CT at 6-12 months is recommended. If the nodule is stable at time of repeat CT, then future CT at 18-24 months (from today's scan) is considered optional for low-risk patients, but is recommended for high-risk patients. This recommendation follows the consensus statement: Guidelines for Management of Incidental Pulmonary Nodules Detected on CT Images: From the Fleischner Society 2017; Radiology 2017; 284:228-243. 5. Aortic atherosclerosis. Electronically Signed   By: Thornell Sartorius M.D.   On: 01/07/2023 02:04   DG Chest Portable 1 View  Result Date: 01/07/2023 CLINICAL DATA:  Nausea EXAM: PORTABLE CHEST 1 VIEW COMPARISON:  09/28/2021 FINDINGS: Cardiac shadow is within normal limits. Large hiatal hernia is noted. Aortic calcifications are seen. Lungs are well aerated bilaterally. No bony abnormality is noted. IMPRESSION: No acute abnormality noted. Electronically  Signed   By: Alcide Clever M.D.   On: 01/07/2023 00:54    Procedures .Critical Care E&M  Performed by: Nicanor Alcon  Aeneas Longsworth, MD Critical care provider statement:    Critical care time (minutes):  90   Critical care end time:  01/07/2023 2:50 AM   Critical care was necessary to treat or prevent imminent or life-threatening deterioration of the following conditions: GI bleed.   Critical care was time spent personally by me on the following activities:  Discussions with consultants, evaluation of patient's response to treatment, examination of patient, ordering and performing treatments and interventions, ordering and review of laboratory studies, ordering and review of radiographic studies, pulse oximetry, review of old charts, re-evaluation of patient's condition and obtaining history from patient or surrogate   Care discussed with: admitting provider   After initial E/M assessment, critical care services were subsequently performed that were exclusive of separately billable procedures or treatment.   Comments:     Kcentra given for GI bleed on Eliquis.  I have ordered the type and cross and first units of blood for the patient secondary to decreasing hemoglobin in the ED     Medications Ordered in ED Medications  prothrombin complex conc human (KCENTRA) IVPB 3,097 Units (has no administration in time range)  pantoprazole (PROTONIX) injection 40 mg (40 mg Intravenous Given 01/07/23 0212)    Followed by  pantoprazole (PROTONIX) injection 40 mg (has no administration in time range)  prothrombin complex conc human (KCENTRA) injection (has no administration in time range)  prothrombin complex conc human (KCENTRA) injection (has no administration in time range)  ondansetron (ZOFRAN) injection 4 mg (0 mg Intravenous Not Given 01/07/23 0047)  famotidine (PEPCID) IVPB 20 mg premix (0 mg Intravenous Stopped 01/07/23 0217)  iohexol (OMNIPAQUE) 300 MG/ML solution 100 mL (100 mLs Intravenous Contrast Given  01/07/23 0133)    ED Course/ Medical Decision Making/ A&P                                 Medical Decision Making Patient with abdominal pain, indigestion and nausea since eating part of a taco salad on Wednesday and then the other part on Thursday   Amount and/or Complexity of Data Reviewed Independent Historian: spouse    Details: See above  External Data Reviewed: labs and notes.    Details: Baseline hemoglobin 12.22 September 2022; previous notes reviewed  Labs: ordered.    Details: Normal lipase 32, hemoccult is positive. normal white count 6.8, critically low hemoglobin 5.9, normal platelets 235. Urine is negative for UTI.  Normal sodium 135, normal potassium 4, creatinine slight elevation 1.05. Repeat hemoglobin 5 Radiology: ordered and independent interpretation performed.    Details: Hiatal hernia, no free air markedly enlarged bladder on CT. CXR with hiatal hernia by me  ECG/medicine tests: ordered and independent interpretation performed. Decision-making details documented in ED Course.  Risk Prescription drug management. Decision regarding hospitalization. Risk Details: Patient given Zofran for nausea.  Indigestion could be GERD in the setting of a patient with GERD and hiatal hernia but have started cardiac work up as well. Pepcid given.   Patient now stating stools have been very dark on Eliquis.  Hemoccult is positive.  Indigestion could also be ulcer vs.  Gastritis in the setting of melena and 7 gram drop of hemoglobin.  GI consulted via secure chat.  Decision made to start Protonix bolus and drip.  Patient has not had Eliquis since am so decision made to start Kcentra in the setting of acute GI bleed.  Patient also in urinary retention and bladder  decompressed with foley catheter.  Patient remained hemodynamically stable in the ED.  Patient was admitted to step down at [facility] but no beds available and upon repeat H/H hemoglobin drop 0.9 grams more during ED stay.  Decision  made to transfer ED to ED so patient can start blood administration and hold in ED where Hospitalists can see the patient.  [facility] transferred patient ED to ED.      Final Clinical Impression(s) / ED Diagnoses Final diagnoses:  Gastrointestinal hemorrhage, unspecified gastrointestinal hemorrhage type  Acute urinary retention   The patient appears reasonably stabilized for admission considering the current resources, flow, and capabilities available in the ED at this time, and I doubt any other [facility] requiring further screening and/or treatment in the ED prior to admission.        Ailed Defibaugh, MD 01/07/23 [phone]

## 2023-01-07 NOTE — Plan of Care (Signed)
Plan of Care Note for accepted transfer   [facility] will assume care on arrival to accepting facility. Until arrival, care as per EDP. However, [facility] available 24/7 for questions and assistance.  Check [email or web address] for on-call coverage.  Nursing staff, please call [facility] Admits & Consults System-Wide number under Amion on patient's arrival so appropriate admitting provider can evaluate the pt.

## 2023-01-07 NOTE — ED Notes (Signed)
Pt transferred to CT via stretcher.

## 2023-01-07 NOTE — H&P (Signed)
History and Physical  Stephanie Foster VQQ:[id number] DOB: [birth year] DOA: 01/07/2023  PCP: Shellia Cleverly, PA   Chief Complaint: Weakness, dizziness  HPI: Stephanie Foster is a 89 y.o. female with medical history significant for hypertension, dyslipidemia, paroxysmal atrial fibrillation on Eliquis being admitted to the hospital for symptomatic blood loss anemia.  Patient is resting comfortably, tells me that over the last few days she had some nausea/abdominal discomfort, but no vomiting.  She thought that maybe she had eaten something bad.  But over the last several days she also noticed that he was feeling very weak, dizzy and lightheaded.  She thought maybe she had eaten something bad.  In retrospect, she tells me that for many months probably since she has started taking Eliquis, she has noticed black stools.  She said that she discussed this with her doctors, but they told her not to worry about it too much unless she sees any bright red blood which she never did.  On evaluation at the outlying emergency department, she was noted to have severe anemia, melanotic stools, and large hiatal hernia.  She was also noted to be hypoxic.  Gastroenterology was consulted, she was given Kcentra to reverse her Eliquis.  Repeat hemoglobin this morning was 5.0, blood transfusion has been ordered.  Review of Systems: Please see HPI for pertinent positives and negatives. A complete 10 system review of systems are otherwise negative.  Past Medical History:  Diagnosis Date   Cataract    Chest pain 03/24/2012   Diaphoresis 03/24/2012   Diverticulosis    Dry eye syndrome of both lacrimal glands 11/16/2017   Dyslipidemia 02/28/2020   Dyspnea on exertion 02/28/2020   Essential hypertension 02/28/2020   GERD (gastroesophageal reflux disease)    H/O hiatal hernia    Hernia    Hypertension    Late effect of cerebrovascular accident (CVA) 02/28/2020   Nausea 03/24/2012   Nonexudative age-related macular degeneration, bilateral,  early dry stage 11/16/2017   Pseudophakia of both eyes 11/16/2017   Stroke (HCC) 02/22/2020   Past Surgical History:  Procedure Laterality Date   ABDOMINAL HYSTERECTOMY     BLADDER SUSPENSION     EYE SURGERY     TONSILLECTOMY      Social History:  reports that she has never smoked. She has never used smokeless tobacco. She reports that she does not drink alcohol and does not use drugs.   Allergies  Allergen Reactions   Ciprofloxacin Hives and Itching   Dextromethorphan-Guaifenesin Other (See Comments)    Muscle Weakness   Aspartame And Phenylalanine Other (See Comments)    "heart felt like swelling, top of head felt like blood pressure coming out of top"   Codeine Nausea Only   Diovan [Valsartan] Other (See Comments)    "makes me weak"   Flagyl [Metronidazole] Diarrhea and Nausea And Vomiting   Macrobid [Nitrofurantoin Macrocrystal] Nausea Only   Phenylalanine Other (See Comments)    "heart felt like swelling, top of head felt like blood pressure coming out of top"   Prednisone Other (See Comments)    "makes me feel so weak that I can't walk across the floor"   Sulfasalazine Other (See Comments)   Vitasana [Actical]     "tired, arms and legs felt like going down"   Aspirin Palpitations   Doxycycline Other (See Comments)   Sulfa Antibiotics Nausea Only and Other (See Comments)    Other reaction(s): NAUSEA    Family History  Problem Relation Age of  Onset   Coronary artery disease Father    Coronary artery disease Brother      Prior to Admission medications   Medication Sig Start Date End Date Taking? Authorizing Provider  atorvastatin (LIPITOR) 20 MG tablet Take 20 mg by mouth daily.    [provider]  Cranberry-Vitamin C-Vitamin E 4200-20-3 MG-MG-UNIT CAPS Take 1 tablet by mouth 2 (two) times daily.    [provider]  diltiazem (CARDIZEM CD) 120 MG 24 hr capsule Take 1 capsule (120 mg total) by mouth 2 (two) times daily. Patient taking differently:  Take 120 mg by mouth once a week. On Saturday's 05/24/22   [name], MD  ELIQUIS 5 MG TABS tablet TAKE 1 TABLET(5 MG) BY MOUTH TWICE DAILY Patient taking differently: Take 5 mg by mouth 2 (two) times daily. 10/15/22   [name], MD  Ferrous Fumarate (HEMOCYTE - 106 MG FE) 324 (106 Fe) MG TABS tablet Take 3 tablets (318 mg of iron total) by mouth daily with breakfast. Patient taking differently: Take 324 mg of iron by mouth every other day. 05/24/22   [name], MD    Physical Exam: BP (!) 114/51   Pulse 84   Temp 99.2 F (37.3 C) (Oral)   Resp (!) 22   Ht 5' (1.524 m)   Wt 66.2 kg   SpO2 95%   BMI 28.50 kg/m   General:  Alert, oriented, calm, in no acute distress, elderly female appearing her stated age, looks a little pale. Cardiovascular: RRR, no murmurs or rubs, no peripheral edema  Respiratory: clear to auscultation bilaterally, no wheezes, no crackles  Abdomen: soft, nontender, nondistended, normal bowel tones heard  Skin: dry, no rashes  Musculoskeletal: no joint effusions, normal range of motion  Psychiatric: appropriate affect, normal speech  Neurologic: extraocular muscles intact, clear speech, moving all extremities with intact sensorium         Labs on Admission:  Basic Metabolic Panel: Recent Labs  Lab 01/07/23 0004  NA 135  K 4.0  CL 103  CO2 24  GLUCOSE 143*  BUN 24*  CREATININE 1.05*  CALCIUM 8.9   Liver Function Tests: Recent Labs  Lab 01/07/23 0004  AST 17  ALT 12  ALKPHOS 90  BILITOT 0.8  PROT 5.8*  ALBUMIN 3.3*   Recent Labs  Lab 01/07/23 0004  LIPASE 32   No results for input(s): "AMMONIA" in the last 168 hours. CBC: Recent Labs  Lab 01/07/23 0004 01/07/23 0453  WBC 8.9 8.8  NEUTROABS  --  7.5  HGB 5.9* 5.0*  HCT 21.2* 18.0*  MCV 68.2* 67.4*  PLT 235 206   Cardiac Enzymes: No results for input(s): "CKTOTAL", "CKMB", "CKMBINDEX", "TROPONINI" in the last 168 hours.  BNP (last 3 results) No results  for input(s): "BNP" in the last 8760 hours.  ProBNP (last 3 results) No results for input(s): "PROBNP" in the last 8760 hours.  CBG: No results for input(s): "GLUCAP" in the last 168 hours.  Radiological Exams on Admission: CT ABDOMEN PELVIS W CONTRAST  Result Date: 01/07/2023 CLINICAL DATA:  Abdominal pain, acute, nonlocalized. EXAM: CT ABDOMEN AND PELVIS WITH CONTRAST TECHNIQUE: Multidetector CT imaging of the abdomen and pelvis was performed using the standard protocol following bolus administration of intravenous contrast. RADIATION DOSE REDUCTION: This exam was performed according to the departmental dose-optimization program which includes automated exposure control, adjustment of the mA and/or kV according to patient size and/or use of iterative reconstruction technique. CONTRAST:  OMNIPAQUE IOHEXOL 300 MG/ML  SOLN COMPARISON:  10/15/2016. FINDINGS: Lower chest: Atelectasis is present at the lung bases. There is a 6 mm nodule in the right lower lobe, axial image 3. Hepatobiliary: A 9 mm hypodensity is noted in the left lobe of the liver, likely cyst or hemangioma. No biliary ductal dilatation. Gallbladder is without stones. Pancreas: Unremarkable. No pancreatic ductal dilatation or surrounding inflammatory changes. Spleen: Normal in size without focal abnormality. Adrenals/Urinary Tract: The adrenal glands are within normal limits. Right renal atrophy with cortical scarring is noted. Renal calculi are noted on the left. Parapelvic cysts are present on the left. No ureteral calculus or obstructive uropathy bilaterally. The bladder is unremarkable. Stomach/Bowel: There is a large hiatal hernia in the stomach is intrathoracic in location. No bowel obstruction, free air, or pneumatosis. Scattered diverticula are present along the colon without evidence of diverticulitis. Normal appendix is seen in the right lower quadrant. Vascular/Lymphatic: Aortic atherosclerosis. No enlarged abdominal or  pelvic lymph nodes. Reproductive: Status post hysterectomy. No adnexal masses. Other: No abdominopelvic ascites. Musculoskeletal: Degenerative changes are present in the thoracolumbar spine. Pectus excavatum deformity is noted at the chest. No acute osseous abnormality. IMPRESSION: 1. No acute intra-abdominal process. 2. Large hiatal hernia with the stomach intrathoracic in location. 3. Diverticulosis without diverticulitis. 4. 6 mm right lower lobe pulmonary nodule. Non-contrast chest CT at 6-12 months is recommended. If the nodule is stable at time of repeat CT, then future CT at 18-24 months (from today's scan) is considered optional for low-risk patients, but is recommended for high-risk patients. This recommendation follows the consensus statement: Guidelines for Management of Incidental Pulmonary Nodules Detected on CT Images: From the Fleischner Society 2017; Radiology 2017; 284:228-243. 5. Aortic atherosclerosis. Electronically Signed   By: Thornell Sartorius M.D.   On: 01/07/2023 02:04   DG Chest Portable 1 View  Result Date: 01/07/2023 CLINICAL DATA:  Nausea EXAM: PORTABLE CHEST 1 VIEW COMPARISON:  09/28/2021 FINDINGS: Cardiac shadow is within normal limits. Large hiatal hernia is noted. Aortic calcifications are seen. Lungs are well aerated bilaterally. No bony abnormality is noted. IMPRESSION: No acute abnormality noted. Electronically Signed   By: Alcide Clever M.D.   On: 01/07/2023 00:54    Assessment/Plan Stephanie Foster is a 89 y.o. female with medical history significant for hypertension, dyslipidemia, paroxysmal atrial fibrillation on Eliquis being admitted to the hospital for symptomatic blood loss anemia.    Blood loss anemia-presumably due to upper GI bleed, given melanotic stools, and large hiatal hernia.  Likely exacerbated by Eliquis. -Observation admission to progressive -Continuous telemetry -Given Kcentra, avoid blood thinners -Transfuse to keep hemoglobin greater than 7 -IV PPI  twice daily -Trend hemoglobin every 8 hours  Upper GI bleed-suspected as above -IV PPI -Keep n.p.o. -GI consulted  Acute hypoxic respiratory failure-presumably due to her severe anemia, continue oxygen supplementation  CKD stage III-appears to be at baseline  Hypertension and paroxysmal A-fib-currently blood pressure is acceptable and she is in sinus rhythm, in the setting of severe anemia will likely hold off on antihypertensives for the time being  Hyperlipidemia-Lipitor  DVT prophylaxis: SCDs only    Code Status: Full Code  Consults called: Gastroenterology  Admission status: Observation  Time spent: 56 minutes  Lyle Niblett Sharlette Dense MD [facility] Pager [phone]  If 7PM-7AM, please contact night-coverage [email or web address] Password [facility]  01/07/2023, 7:26 AM

## 2023-01-07 NOTE — ED Notes (Signed)
Hgb 5.2 Cassandra RN aware of results

## 2023-01-07 NOTE — ED Notes (Signed)
[facility] called for transport, Patient going ED to ED to [facility]. No Current ETA... ED Nurse will call ED at  [facility] to give report Called @ 05:17 am

## 2023-01-08 DIAGNOSIS — H9190 Unspecified hearing loss, unspecified ear: Secondary | ICD-10-CM | POA: Diagnosis present

## 2023-01-08 DIAGNOSIS — Z8249 Family history of ischemic heart disease and other diseases of the circulatory system: Secondary | ICD-10-CM | POA: Diagnosis not present

## 2023-01-08 DIAGNOSIS — D62 Acute posthemorrhagic anemia: Secondary | ICD-10-CM | POA: Diagnosis present

## 2023-01-08 DIAGNOSIS — Z961 Presence of intraocular lens: Secondary | ICD-10-CM | POA: Diagnosis present

## 2023-01-08 DIAGNOSIS — Z90710 Acquired absence of both cervix and uterus: Secondary | ICD-10-CM | POA: Diagnosis not present

## 2023-01-08 DIAGNOSIS — J9601 Acute respiratory failure with hypoxia: Secondary | ICD-10-CM | POA: Diagnosis present

## 2023-01-08 DIAGNOSIS — E785 Hyperlipidemia, unspecified: Secondary | ICD-10-CM | POA: Diagnosis present

## 2023-01-08 DIAGNOSIS — I480 Paroxysmal atrial fibrillation: Secondary | ICD-10-CM | POA: Diagnosis present

## 2023-01-08 DIAGNOSIS — I6930 Unspecified sequelae of cerebral infarction: Secondary | ICD-10-CM | POA: Diagnosis not present

## 2023-01-08 DIAGNOSIS — I129 Hypertensive chronic kidney disease with stage 1 through stage 4 chronic kidney disease, or unspecified chronic kidney disease: Secondary | ICD-10-CM | POA: Diagnosis present

## 2023-01-08 DIAGNOSIS — K219 Gastro-esophageal reflux disease without esophagitis: Secondary | ICD-10-CM | POA: Diagnosis present

## 2023-01-08 DIAGNOSIS — Z7901 Long term (current) use of anticoagulants: Secondary | ICD-10-CM | POA: Diagnosis not present

## 2023-01-08 DIAGNOSIS — H353131 Nonexudative age-related macular degeneration, bilateral, early dry stage: Secondary | ICD-10-CM | POA: Diagnosis present

## 2023-01-08 DIAGNOSIS — N1831 Chronic kidney disease, stage 3a: Secondary | ICD-10-CM | POA: Diagnosis present

## 2023-01-08 DIAGNOSIS — Z79899 Other long term (current) drug therapy: Secondary | ICD-10-CM | POA: Diagnosis not present

## 2023-01-08 DIAGNOSIS — E039 Hypothyroidism, unspecified: Secondary | ICD-10-CM | POA: Diagnosis present

## 2023-01-08 DIAGNOSIS — K449 Diaphragmatic hernia without obstruction or gangrene: Secondary | ICD-10-CM | POA: Diagnosis present

## 2023-01-08 DIAGNOSIS — Z9849 Cataract extraction status, unspecified eye: Secondary | ICD-10-CM | POA: Diagnosis not present

## 2023-01-08 DIAGNOSIS — K922 Gastrointestinal hemorrhage, unspecified: Secondary | ICD-10-CM | POA: Diagnosis present

## 2023-01-08 DIAGNOSIS — K921 Melena: Secondary | ICD-10-CM | POA: Diagnosis present

## 2023-01-08 DIAGNOSIS — Z91128 Patient's intentional underdosing of medication regimen for other reason: Secondary | ICD-10-CM | POA: Diagnosis not present

## 2023-01-08 DIAGNOSIS — Z8719 Personal history of other diseases of the digestive system: Secondary | ICD-10-CM | POA: Diagnosis not present

## 2023-01-08 DIAGNOSIS — R911 Solitary pulmonary nodule: Secondary | ICD-10-CM | POA: Diagnosis present

## 2023-01-08 DIAGNOSIS — R339 Retention of urine, unspecified: Secondary | ICD-10-CM | POA: Diagnosis present

## 2023-01-08 DIAGNOSIS — T381X6A Underdosing of thyroid hormones and substitutes, initial encounter: Secondary | ICD-10-CM | POA: Diagnosis present

## 2023-01-08 LAB — BASIC METABOLIC PANEL
Anion gap: 6 (ref 5–15)
BUN: 16 mg/dL (ref 8–23)
CO2: 25 mmol/L (ref 22–32)
Calcium: 8.1 mg/dL — ABNORMAL LOW (ref 8.9–10.3)
Chloride: 104 mmol/L (ref 98–111)
Creatinine, Ser: 1.14 mg/dL — ABNORMAL HIGH (ref 0.44–1.00)
GFR, Estimated: 44 mL/min — ABNORMAL LOW (ref >60)
Glucose, Bld: 100 mg/dL — ABNORMAL HIGH (ref 70–99)
Potassium: 4.2 mmol/L (ref 3.5–5.1)
Sodium: 135 mmol/L (ref 135–145)

## 2023-01-08 LAB — CBC
HCT: 27.5 % — ABNORMAL LOW (ref 36.0–46.0)
Hemoglobin: 8.3 g/dL — ABNORMAL LOW (ref 12.0–15.0)
MCH: 22.6 pg — ABNORMAL LOW (ref 26.0–34.0)
MCHC: 30.2 g/dL (ref 30.0–36.0)
MCV: 74.7 fL — ABNORMAL LOW (ref 80.0–100.0)
Platelets: 171 10*3/uL (ref 150–400)
RBC: 3.68 MIL/uL — ABNORMAL LOW (ref 3.87–5.11)
RDW: 22.5 % — ABNORMAL HIGH (ref 11.5–15.5)
WBC: 6.2 10*3/uL (ref 4.0–10.5)
nRBC: 0.3 % — ABNORMAL HIGH (ref 0.0–0.2)

## 2023-01-08 LAB — TSH: TSH: 5.14 u[IU]/mL — ABNORMAL HIGH (ref 0.350–4.500)

## 2023-01-08 MED ORDER — CHLORHEXIDINE GLUCONATE CLOTH 2 % EX PADS: 6.0000 | MEDICATED_PAD | Freq: Every day | CUTANEOUS | Status: DC

## 2023-01-08 MED ORDER — DILTIAZEM HCL ER COATED BEADS 120 MG PO CP24
120.0000 mg | ORAL_CAPSULE | Freq: Two times a day (BID) | ORAL | Status: DC
  Administered 2023-01-08: 120 mg via ORAL
  Filled 2023-01-08: qty 1

## 2023-01-08 NOTE — Progress Notes (Addendum)
PROGRESS NOTE    Stephanie Foster  MWN:[id number] DOB: 03/07/1924 DOA: 01/07/2023 PCP: Shellia Cleverly, PA   Brief Narrative: Stephanie Foster is a 89 y.o. female with a history of primary pretension, hyperlipidemia, paroxysmal atrial fibrillation, hypothyroidism, CKD stage IIIa.  Patient presented secondary to weakness and dizziness and was found to have evidence of acute anemia with hemoglobin of 5.0 g/dL in setting of melanotic stools concerning for upper GI bleeding.  Patient received a transfusion of 2 units of PRBC.  GI was consulted with recommendations for upper endoscopy evaluation, which was declined by patient/family.   Assessment and Plan:  Acute blood loss anemia Secondary to presumed upper GI bleeding. Hemoglobin of 5.0 g/dL on admission. Patient transfused 2 units of PRBC on admission with post-transfusion hemoglobin up to 8.1 g.dL. Hemoglobin stable overnight and this morning. -Follow-up CBC in AM  Upper GI bleed Presumed etiology for blood loss and melena. GI consulted and recommended upper GI endoscopy, however patient has declined. Plan to treat conservatively with Protonix and discontinuing Eliquis. -Continue Protonix with plan to discharge on Protonix 40 mg daily at discharge  Acute respiratory failure with hypoxia Presumed secondary to anemia. Patient with associated dyspnea and documented SpO2 down to 86%.  Patient requiring up to 3 L/min overnight. -Wean to room air as able  Acute urinary retention Unclear etiology. Foley catheter placed while in the ED. -Voiding trial  CKD stage IIIa Stable.  Hypothyroidism Patient prescribed levothyroxine as an outpatient but listed as not taking.  Primary hypertension Patient is on Cardizem CD -Restart Cardizem CD 120 mg BID  Paroxysmal atrial fibrillation Patient is on diltiazem and Eliquis as an outpatient. Eliquis held secondary to GI bleeding with plan to discontinue on discharge. -Continue Cardizem CD 120 mg  BID  Hyperlipidemia -Continue Lipitor  Lung nodule 6 mm right lower lobe pulmonary nodule. Repeat non-contrast CT recommended in 6-12 months.   DVT prophylaxis: SCDs Code Status:   Code Status: Full Code Family Communication: Son on telephone Disposition Plan: Discharge home likely in 24 hours pending stable hemoglobin and following results of voiding trial   Consultants:  [facility]  Procedures:  None  Antimicrobials: None    Subjective: Patient states no bowel movement overnight. Feels well. No issues this morning or from overnight.  Objective: BP 118/60 (BP Location: Left Arm)   Pulse 70   Temp 97.6 F (36.4 C) (Oral)   Resp (!) 21   Ht 5' (1.524 m)   Wt 66.2 kg   SpO2 95%   BMI 28.50 kg/m   Examination:  General exam: Appears calm and comfortable Respiratory system: Clear to auscultation. Respiratory effort normal. Cardiovascular system: S1 & S2 heard Gastrointestinal system: Abdomen is nondistended, soft and nontender. Normal bowel sounds heard. Central nervous system: Alert and oriented. No focal neurological deficits. Musculoskeletal: No edema. No calf tenderness Psychiatry: Judgement and insight appear normal. Mood & affect appropriate.    Data Reviewed: I have personally reviewed following labs and imaging studies  CBC Lab Results  Component Value Date   WBC 8.8 01/07/2023   RBC 2.67 (L) 01/07/2023   HGB 8.2 (L) 01/07/2023   HCT 27.4 (L) 01/07/2023   MCV 67.4 (L) 01/07/2023   MCH 18.7 (L) 01/07/2023   PLT 206 01/07/2023   MCHC 27.8 (L) 01/07/2023   RDW 20.2 (H) 01/07/2023   LYMPHSABS 0.5 (L) 01/07/2023   MONOABS 0.7 01/07/2023   EOSABS 0.0 01/07/2023   BASOSABS 0.0 01/07/2023  Last metabolic panel Lab Results  Component Value Date   NA 135 01/07/2023   K 4.0 01/07/2023   CL 103 01/07/2023   CO2 24 01/07/2023   BUN 24 (H) 01/07/2023   CREATININE 1.05 (H) 01/07/2023   GLUCOSE 143 (H) 01/07/2023   GFRNONAA 49 (L) 01/07/2023    GFRAA 61 04/02/2020   CALCIUM 8.9 01/07/2023   PROT 5.8 (L) 01/07/2023   ALBUMIN 3.3 (L) 01/07/2023   LABGLOB 2.3 12/15/2021   AGRATIO 1.8 12/15/2021   BILITOT 0.8 01/07/2023   ALKPHOS 90 01/07/2023   AST 17 01/07/2023   ALT 12 01/07/2023   ANIONGAP 8 01/07/2023    GFR: Estimated Creatinine Clearance: 26.6 mL/min (A) (by C-G formula based on SCr of 1.05 mg/dL (H)).  No results found for this or any previous visit (from the past 240 hour(s)).    Radiology Studies: CT ABDOMEN PELVIS W CONTRAST  Result Date: 01/07/2023 CLINICAL DATA:  Abdominal pain, acute, nonlocalized. EXAM: CT ABDOMEN AND PELVIS WITH CONTRAST TECHNIQUE: Multidetector CT imaging of the abdomen and pelvis was performed using the standard protocol following bolus administration of intravenous contrast. RADIATION DOSE REDUCTION: This exam was performed according to the departmental dose-optimization program which includes automated exposure control, adjustment of the mA and/or kV according to patient size and/or use of iterative reconstruction technique. CONTRAST:  OMNIPAQUE IOHEXOL 300 MG/ML  SOLN COMPARISON:  10/15/2016. FINDINGS: Lower chest: Atelectasis is present at the lung bases. There is a 6 mm nodule in the right lower lobe, axial image 3. Hepatobiliary: A 9 mm hypodensity is noted in the left lobe of the liver, likely cyst or hemangioma. No biliary ductal dilatation. Gallbladder is without stones. Pancreas: Unremarkable. No pancreatic ductal dilatation or surrounding inflammatory changes. Spleen: Normal in size without focal abnormality. Adrenals/Urinary Tract: The adrenal glands are within normal limits. Right renal atrophy with cortical scarring is noted. Renal calculi are noted on the left. Parapelvic cysts are present on the left. No ureteral calculus or obstructive uropathy bilaterally. The bladder is unremarkable. Stomach/Bowel: There is a large hiatal hernia in the stomach is intrathoracic in location. No  bowel obstruction, free air, or pneumatosis. Scattered diverticula are present along the colon without evidence of diverticulitis. Normal appendix is seen in the right lower quadrant. Vascular/Lymphatic: Aortic atherosclerosis. No enlarged abdominal or pelvic lymph nodes. Reproductive: Status post hysterectomy. No adnexal masses. Other: No abdominopelvic ascites. Musculoskeletal: Degenerative changes are present in the thoracolumbar spine. Pectus excavatum deformity is noted at the chest. No acute osseous abnormality. IMPRESSION: 1. No acute intra-abdominal process. 2. Large hiatal hernia with the stomach intrathoracic in location. 3. Diverticulosis without diverticulitis. 4. 6 mm right lower lobe pulmonary nodule. Non-contrast chest CT at 6-12 months is recommended. If the nodule is stable at time of repeat CT, then future CT at 18-24 months (from today's scan) is considered optional for low-risk patients, but is recommended for high-risk patients. This recommendation follows the consensus statement: Guidelines for Management of Incidental Pulmonary Nodules Detected on CT Images: From the Fleischner Society 2017; Radiology 2017; 284:228-243. 5. Aortic atherosclerosis. Electronically Signed   By: Thornell Sartorius M.D.   On: 01/07/2023 02:04   DG Chest Portable 1 View  Result Date: 01/07/2023 CLINICAL DATA:  Nausea EXAM: PORTABLE CHEST 1 VIEW COMPARISON:  09/28/2021 FINDINGS: Cardiac shadow is within normal limits. Large hiatal hernia is noted. Aortic calcifications are seen. Lungs are well aerated bilaterally. No bony abnormality is noted. IMPRESSION: No acute abnormality noted. Electronically Signed  By: Alcide Clever M.D.   On: 01/07/2023 00:54      LOS: 0 days    Jacquelin Hawking, MD [facility] 01/08/2023, 7:43 AM   If 7PM-7AM, please contact night-coverage [email or web address]

## 2023-01-08 NOTE — Progress Notes (Signed)
[facility] Gastroenterology Progress Note  Stephanie Foster 89 y.o. [birth year]  CC: GI bleed/melena   Subjective: Patient seen and examined at bedside.  Very hard of hearing.  Denies any bleeding.  ROS : Afebrile, denies nausea or vomiting.   Objective: Vital signs in last 24 hours: Vitals:   01/08/23 0525 01/08/23 0845  BP: 118/60 (!) 105/53  Pulse: 70 75  Resp:  17  Temp: 97.6 F (36.4 C) 98.6 F (37 C)  SpO2: 95% 97%    Physical Exam: Elderly patient, very hard of hearing.  No apparent distress.  Abdominal exam benign.  Lab Results: Recent Labs    01/07/23 0004 01/08/23 0745  NA 135 135  K 4.0 4.2  CL 103 104  CO2 24 25  GLUCOSE 143* 100*  BUN 24* 16  CREATININE 1.05* 1.14*  CALCIUM 8.9 8.1*   Recent Labs    01/07/23 0004  AST 17  ALT 12  ALKPHOS 90  BILITOT 0.8  PROT 5.8*  ALBUMIN 3.3*   Recent Labs    01/07/23 0453 01/07/23 0837 01/07/23 2320 01/08/23 0745  WBC 8.8  --   --  6.2  NEUTROABS 7.5  --   --   --   HGB 5.0*   < > 8.2* 8.3*  HCT 18.0*   < > 27.4* 27.5*  MCV 67.4*  --   --  74.7*  PLT 206  --   --  171   < > = values in this interval not displayed.   Recent Labs    01/07/23 0739  LABPROT 14.9  INR 1.2      Assessment/Plan: -Melena in a patient with large hiatal hernia intrathoracic stomach.  Melena likely from Cameron's ulcers/erosions -Symptomatic anemia.  Hemoglobin was down to 5 on admission.  Hemoglobin improved to 8.3 with blood transfusion. -Paroxysmal atrial fibrillation.  Was on Eliquis.  Currently on hold.  Patient and family may not resume anticoagulation.  Recommendations -------------------------- -Patient declined EGD yesterday. -Hemoglobin stable today. -No further inpatient GI workup planned.  Recommend pantoprazole or omeprazole 40 mg once a day indefinitely because of large hiatal hernia which may cause ongoing GI blood loss from erosions and inflammation. -Avoid NSAIDs.  GI will sign off.  Call us back if  needed.  Kathi Der MD, FACP 01/08/2023, 11:20 AM  Contact #  [phone]

## 2023-01-08 NOTE — Plan of Care (Signed)
  Problem: Clinical Measurements: Goal: Complications related to the disease process, condition or treatment will be avoided or minimized Outcome: Progressing   Problem: Safety: Goal: Ability to remain free from injury will improve 01/08/2023 0510 by Thea Gist, RN Outcome: Progressing 01/08/2023 0509 by Thea Gist, RN Outcome: Progressing   Problem: Skin Integrity: Goal: Risk for impaired skin integrity will decrease 01/08/2023 0510 by Thea Gist, RN Outcome: Progressing 01/08/2023 0509 by Thea Gist, RN Outcome: Progressing   Problem: Bowel/Gastric: Goal: Will show no signs and symptoms of gastrointestinal bleeding Outcome: Progressing   Problem: Coping: Goal: Level of anxiety will decrease 01/08/2023 0510 by Thea Gist, RN Outcome: Progressing 01/08/2023 0509 by Thea Gist, RN Outcome: Progressing   Problem: Education: Goal: Knowledge of General Education information will improve Description: Including pain rating scale, medication(s)/side effects and non-pharmacologic comfort measures 01/08/2023 0510 by Thea Gist, RN Outcome: Progressing 01/08/2023 0509 by Thea Gist, RN Outcome: Progressing   Problem: Clinical Measurements: Goal: Ability to maintain clinical measurements within normal limits will improve 01/08/2023 0510 by Thea Gist, RN Outcome: Progressing 01/08/2023 0509 by Thea Gist, RN Outcome: Progressing

## 2023-01-08 NOTE — Progress Notes (Signed)
Mobility Specialist - Progress Note   01/08/23 1327  Mobility  Activity Transferred from bed to chair  Level of Assistance Minimal assist, patient does 75% or more  Assistive Device Front wheel walker  Distance Ambulated (ft) 3 ft  Range of Motion/Exercises Active Assistive  Activity Response Tolerated well  Mobility Referral Yes  $Mobility charge 1 Mobility  Mobility Specialist Start Time (ACUTE ONLY) 1315  Mobility Specialist Stop Time (ACUTE ONLY) 1325  Mobility Specialist Time Calculation (min) (ACUTE ONLY) 10 min   Pt was found in bed and agreeable to transfer to recliner chair. Pt was min-A for bed mobility. Was found to be soiled and notified RN. Was left on recliner chair with all needs met. Call bell in reach and chair alarm on.  Billey Chang Mobility Specialist

## 2023-01-08 NOTE — Hospital Course (Signed)
Stephanie Foster is a 89 y.o. female with a history of primary pretension, hyperlipidemia, paroxysmal atrial fibrillation, hypothyroidism, CKD stage IIIa.  Patient presented secondary to weakness and dizziness and was found to have evidence of acute anemia with hemoglobin of 5.0 g/dL in setting of melanotic stools concerning for upper GI bleeding.  Patient received a transfusion of 2 units of PRBC.  GI was consulted with recommendations for upper endoscopy evaluation, which was declined by patient/family.

## 2023-01-09 DIAGNOSIS — J9601 Acute respiratory failure with hypoxia: Secondary | ICD-10-CM

## 2023-01-09 DIAGNOSIS — K922 Gastrointestinal hemorrhage, unspecified: Secondary | ICD-10-CM | POA: Diagnosis not present

## 2023-01-09 LAB — CBC
HCT: 28.9 % — ABNORMAL LOW (ref 36.0–46.0)
Hemoglobin: 8.3 g/dL — ABNORMAL LOW (ref 12.0–15.0)
MCH: 22.1 pg — ABNORMAL LOW (ref 26.0–34.0)
MCHC: 28.7 g/dL — ABNORMAL LOW (ref 30.0–36.0)
MCV: 76.9 fL — ABNORMAL LOW (ref 80.0–100.0)
Platelets: 164 10*3/uL (ref 150–400)
RBC: 3.76 MIL/uL — ABNORMAL LOW (ref 3.87–5.11)
RDW: 23.1 % — ABNORMAL HIGH (ref 11.5–15.5)
WBC: 4.8 10*3/uL (ref 4.0–10.5)
nRBC: 0 % (ref 0.0–0.2)

## 2023-01-09 MED ORDER — LEVOTHYROXINE SODIUM 25 MCG PO TABS: 12.5000 ug | ORAL_TABLET | Freq: Every day | ORAL | 2 refills | Status: DC

## 2023-01-09 MED ORDER — PANTOPRAZOLE SODIUM 40 MG PO TBEC: 40.0000 mg | DELAYED_RELEASE_TABLET | Freq: Every day | ORAL | 0 refills | Status: DC

## 2023-01-09 NOTE — Progress Notes (Signed)
OT Cancellation Note  Patient Details Name: Stephanie Foster MRN: [id number] DOB: 05-27-1924   Cancelled Treatment:    Reason Eval/Treat Not Completed: OT screened, no needs identified, will sign off Patient reporting being at her baseline and not needing therapy. Patient reported " I am going home today". OT to sign off.  Rosalio Loud, MS Acute Rehabilitation Department Office# [phone]  01/09/2023, 8:08 AM

## 2023-01-09 NOTE — Plan of Care (Signed)
  Problem: Education: Goal: Knowledge of General Education information will improve Description Including pain rating scale, medication(s)/side effects and non-pharmacologic comfort measures Outcome: Progressing   Problem: Health Behavior/Discharge Planning: Goal: Ability to manage health-related needs will improve Outcome: Progressing   

## 2023-01-09 NOTE — Progress Notes (Signed)
AVS and discharge instructions reviewed w/ patient and son at the bedside. All parties verbalized understanding. The son is patient's transportation to home.

## 2023-01-09 NOTE — Plan of Care (Signed)

## 2023-01-09 NOTE — Discharge Summary (Signed)
Physician Discharge Summary   Patient: Stephanie Foster MRN: [id number] DOB: 1924-04-20  Admit date:     01/07/2023  Discharge date: 01/09/23  Discharge Physician: Jacquelin Hawking, MD   PCP: Shellia Cleverly, PA   Recommendations at discharge:  PCP visit for hospital follow-up Repeat CT chest in 6-12 months for follow-up of lung nodule per goals of care Repeat TSH in 4-6 weeks  Discharge Diagnoses: Principal Problem:   Acute GI bleeding Active Problems:   Essential hypertension   GERD (gastroesophageal reflux disease)   Paroxysmal atrial fibrillation (HCC)   Stage 3 chronic kidney disease (HCC)  Resolved Problems:   Acute respiratory failure with hypoxia [facility])  Hospital Course: Stephanie Foster is a 89 y.o. female with a history of primary pretension, hyperlipidemia, paroxysmal atrial fibrillation, hypothyroidism, CKD stage IIIa.  Patient presented secondary to weakness and dizziness and was found to have evidence of acute anemia with hemoglobin of 5.0 g/dL in setting of melanotic stools concerning for upper GI bleeding.  Patient received a transfusion of 2 units of PRBC.  GI was consulted with recommendations for upper endoscopy evaluation, which was declined by patient/family.  Assessment and Plan:  Acute blood loss anemia Secondary to presumed upper GI bleeding. Hemoglobin of 5.0 g/dL on admission. Patient transfused 2 units of PRBC on admission with post-transfusion hemoglobin up to 8.1 g.dL. Hemoglobin stable at 8.3 g/dL prior to discharge   Upper GI bleed Presumed etiology for blood loss and melena. GI consulted and recommended upper GI endoscopy, however patient has declined. Plan to treat conservatively with Protonix and discontinuing Eliquis. Discharge on Protonix 40 mg daily.   Acute respiratory failure with hypoxia Presumed secondary to anemia. Patient with associated dyspnea and documented SpO2 down to 86%.  Patient requiring up to 3 L/min overnight and weaned successfully to room  air.   Acute urinary retention Unclear etiology. Foley catheter placed while in the ED. Foley catheter removed the day after admission and patient successfully completed a voiding trial.   CKD stage IIIa Stable.   Hypothyroidism Patient prescribed levothyroxine as an outpatient but patient/son states she stopped taking it secondary to agitation. TSH elevated at 5.140. Will restart Synthroid at reduced dose of 12.5 mcg per day and recommend PCP follow-up.   Primary hypertension Patient is on Cardizem CD. Resume on discharge.   Paroxysmal atrial fibrillation Patient is on diltiazem and Eliquis as an outpatient. Eliquis held secondary to GI bleeding and discontinued on discharge. Continue Cardizem CD.   Hyperlipidemia Continue Lipitor.   Lung nodule 6 mm right lower lobe pulmonary nodule. Repeat non-contrast CT recommended in 6-12 months.   Consultants: Debor[facility] Sprang [facility] Procedures performed: None  Disposition: Home Diet recommendation: Cardiac diet   DISCHARGE MEDICATION: Allergies as of 01/09/2023       Reactions   Ciprofloxacin Hives, Itching   Dextromethorphan-guaifenesin Other (See Comments)   Muscle Weakness   Aspartame And Phenylalanine Other (See Comments)   "heart felt like swelling, top of head felt like blood pressure coming out of top"   Codeine Nausea Only   Diovan [valsartan] Other (See Comments)   "makes me weak"   Flagyl [metronidazole] Diarrhea, Nausea And Vomiting   Macrobid [nitrofurantoin Macrocrystal] Nausea Only   Phenylalanine Other (See Comments)   "heart felt like swelling, top of head felt like blood pressure coming out of top"   Prednisone Other (See Comments)   "makes me feel so weak that I can't walk across the floor"   Sulfasalazine Nausea Only  Vitasana [actical] Other (See Comments)   "tired, arms and legs felt like going down"   Aspirin Palpitations   Doxycycline Other (See Comments)   Sulfa Antibiotics Nausea Only        Medication List      STOP taking these medications    Eliquis 5 MG Tabs tablet Generic drug: apixaban   Ferrous Fumarate 324 (106 Fe) MG Tabs tablet Commonly known as: HEMOCYTE - 106 mg FE       TAKE these medications    atorvastatin 20 MG tablet Commonly known as: LIPITOR Take 20 mg by mouth daily.   Cranberry-Vitamin C-Vitamin E 4200-20-3 MG-MG-UNIT Caps Take 1 tablet by mouth daily.   diltiazem 120 MG 24 hr capsule Commonly known as: CARDIZEM CD Take 1 capsule (120 mg total) by mouth 2 (two) times daily. What changed: when to take this   iron polysaccharides 150 MG capsule Commonly known as: NIFEREX Take 150 mg by mouth every other day.   levothyroxine 25 MCG tablet Commonly known as: Synthroid Take 0.5 tablets (12.5 mcg total) by mouth daily before breakfast. What changed:  medication strength how much to take   pantoprazole 40 MG tablet Commonly known as: Protonix Take 1 tablet (40 mg total) by mouth daily.   Vitamin D (Ergocalciferol) 1.25 MG (50000 UNIT) Caps capsule Commonly known as: DRISDOL Take 50,000 Units by mouth once a week.        Follow-up Information     Shellia Cleverly, [place]. Schedule an appointment as soon as possible for a visit in 1 week(s).   Specialty: General Practice Why: For hospital follow-up Contact information: [address] [place] [address] [phone]                Discharge Exam: BP 126/82 (BP Location: Left Arm)   Pulse 73   Temp 98 F (36.7 C) (Oral)   Resp 18   Ht 5' (1.524 m)   Wt 66.2 kg   SpO2 96%   BMI 28.50 kg/m   General exam: Appears calm and comfortable  Respiratory system: Respiratory effort normal. Central nervous system: Alert and oriented. Psychiatry: Judgement and insight appear normal. Mood & affect appropriate.   Condition at discharge: stable  The results of significant diagnostics from this hospitalization (including imaging, microbiology, ancillary and laboratory) are listed below for  reference.   Imaging Studies: CT ABDOMEN PELVIS W CONTRAST  Result Date: 01/07/2023 CLINICAL DATA:  Abdominal pain, acute, nonlocalized. EXAM: CT ABDOMEN AND PELVIS WITH CONTRAST TECHNIQUE: Multidetector CT imaging of the abdomen and pelvis was performed using the standard protocol following bolus administration of intravenous contrast. RADIATION DOSE REDUCTION: This exam was performed according to the departmental dose-optimization program which includes automated exposure control, adjustment of the mA and/or kV according to patient size and/or use of iterative reconstruction technique. CONTRAST:  OMNIPAQUE IOHEXOL 300 MG/ML  SOLN COMPARISON:  10/15/2016. FINDINGS: Lower chest: Atelectasis is present at the lung bases. There is a 6 mm nodule in the right lower lobe, axial image 3. Hepatobiliary: A 9 mm hypodensity is noted in the left lobe of the liver, likely cyst or hemangioma. No biliary ductal dilatation. Gallbladder is without stones. Pancreas: Unremarkable. No pancreatic ductal dilatation or surrounding inflammatory changes. Spleen: Normal in size without focal abnormality. Adrenals/Urinary Tract: The adrenal glands are within normal limits. Right renal atrophy with cortical scarring is noted. Renal calculi are noted on the left. Parapelvic cysts are present on the left. No ureteral calculus  or obstructive uropathy bilaterally. The bladder is unremarkable. Stomach/Bowel: There is a large hiatal hernia in the stomach is intrathoracic in location. No bowel obstruction, free air, or pneumatosis. Scattered diverticula are present along the colon without evidence of diverticulitis. Normal appendix is seen in the right lower quadrant. Vascular/Lymphatic: Aortic atherosclerosis. No enlarged abdominal or pelvic lymph nodes. Reproductive: Status post hysterectomy. No adnexal masses. Other: No abdominopelvic ascites. Musculoskeletal: Degenerative changes are present in the thoracolumbar spine. Pectus  excavatum deformity is noted at the chest. No acute osseous abnormality. IMPRESSION: 1. No acute intra-abdominal process. 2. Large hiatal hernia with the stomach intrathoracic in location. 3. Diverticulosis without diverticulitis. 4. 6 mm right lower lobe pulmonary nodule. Non-contrast chest CT at 6-12 months is recommended. If the nodule is stable at time of repeat CT, then future CT at 18-24 months (from today's scan) is considered optional for low-risk patients, but is recommended for high-risk patients. This recommendation follows the consensus statement: Guidelines for Management of Incidental Pulmonary Nodules Detected on CT Images: From the Fleischner Society 2017; Radiology 2017; 284:228-243. 5. Aortic atherosclerosis. Electronically Signed   By: Thornell Sartorius M.D.   On: 01/07/2023 02:04   DG Chest Portable 1 View  Result Date: 01/07/2023 CLINICAL DATA:  Nausea EXAM: PORTABLE CHEST 1 VIEW COMPARISON:  09/28/2021 FINDINGS: Cardiac shadow is within normal limits. Large hiatal hernia is noted. Aortic calcifications are seen. Lungs are well aerated bilaterally. No bony abnormality is noted. IMPRESSION: No acute abnormality noted. Electronically Signed   By: Alcide Clever M.D.   On: 01/07/2023 00:54    Microbiology: Results for orders placed or performed in visit on 03/31/20  Fecal occult blood, imunochemical     Status: Abnormal   Collection Time: 04/03/20  1:46 PM   ST  Result Value Ref Range Status   Fecal Occult Bld Positive (A) Negative Final    Labs: CBC: Recent Labs  Lab 01/07/23 0004 01/07/23 0453 01/07/23 0837 01/07/23 1600 01/07/23 2320 01/08/23 0745 01/09/23 0406  WBC 8.9 8.8  --   --   --  6.2 4.8  NEUTROABS  --  7.5  --   --   --   --   --   HGB 5.9* 5.0* 5.2* 8.1* 8.2* 8.3* 8.3*  HCT 21.2* 18.0* 18.9* 26.5* 27.4* 27.5* 28.9*  MCV 68.2* 67.4*  --   --   --  74.7* 76.9*  PLT 235 206  --   --   --  171 164   Basic Metabolic Panel: Recent Labs  Lab 01/07/23 0004  01/08/23 0745  NA 135 135  K 4.0 4.2  CL 103 104  CO2 24 25  GLUCOSE 143* 100*  BUN 24* 16  CREATININE 1.05* 1.14*  CALCIUM 8.9 8.1*   Liver Function Tests: Recent Labs  Lab 01/07/23 0004  AST 17  ALT 12  ALKPHOS 90  BILITOT 0.8  PROT 5.8*  ALBUMIN 3.3*    Discharge time spent: 35 minutes.  Signed: Jacquelin Hawking, MD [facility] 01/09/2023

## 2023-01-09 NOTE — Discharge Instructions (Addendum)
Stephanie Foster,  You were in the hospital with low blood counts from bleeding from your GI system. You received a blood transfusion with stable hemoglobin. You decided to not have an upper endoscopy performed. Please stop your Eliquis on discharge and discuss with your PCP and cardiologist. Regarding your thyroid, your TSH was high. I recommend you starting back on Synthroid. I have prescribed a very low dose for you on discharge. This can be further discussed with your PCP if you disagree with this recommendation. You also were found to have a lung nodule with recommendation for a repeat CT scan in about 6-12 months; please discuss this with your PCP.

## 2023-01-11 LAB — BPAM RBC
Blood Product Expiration Date: 202412232359
Blood Product Expiration Date: 202412232359
Blood Product Expiration Date: 202412232359
Blood Product Expiration Date: 202412232359
ISSUE DATE / TIME: 202411220844
ISSUE DATE / TIME: 202411221126
Unit Type and Rh: 5100
Unit Type and Rh: 5100
Unit Type and Rh: 5100
Unit Type and Rh: 5100

## 2023-01-11 LAB — TYPE AND SCREEN
ABO/RH(D): O POS
Antibody Screen: NEGATIVE
Unit division: 0
Unit division: 0
Unit division: 0
Unit division: 0

## 2023-01-26 ENCOUNTER — Emergency Department (HOSPITAL_BASED_OUTPATIENT_CLINIC_OR_DEPARTMENT_OTHER): Payer: Medicare Other

## 2023-01-26 ENCOUNTER — Encounter (HOSPITAL_BASED_OUTPATIENT_CLINIC_OR_DEPARTMENT_OTHER): Payer: Self-pay | Admitting: Urology

## 2023-01-26 ENCOUNTER — Other Ambulatory Visit: Payer: Self-pay

## 2023-01-26 ENCOUNTER — Emergency Department (HOSPITAL_BASED_OUTPATIENT_CLINIC_OR_DEPARTMENT_OTHER)
Admission: EM | Admit: 2023-01-26 | Discharge: 2023-01-26 | Disposition: A | Discharge status: Home / Self Care | Payer: Medicare Other | Attending: Emergency Medicine | Admitting: Emergency Medicine

## 2023-01-26 DIAGNOSIS — I129 Hypertensive chronic kidney disease with stage 1 through stage 4 chronic kidney disease, or unspecified chronic kidney disease: Secondary | ICD-10-CM | POA: Diagnosis not present

## 2023-01-26 DIAGNOSIS — Z79899 Other long term (current) drug therapy: Secondary | ICD-10-CM | POA: Insufficient documentation

## 2023-01-26 DIAGNOSIS — R300 Dysuria: Secondary | ICD-10-CM | POA: Diagnosis present

## 2023-01-26 DIAGNOSIS — N183 Chronic kidney disease, stage 3 unspecified: Secondary | ICD-10-CM | POA: Insufficient documentation

## 2023-01-26 DIAGNOSIS — N3001 Acute cystitis with hematuria: Secondary | ICD-10-CM | POA: Insufficient documentation

## 2023-01-26 LAB — CBC WITH DIFFERENTIAL/PLATELET
Abs Immature Granulocytes: 0.02 10*3/uL (ref 0.00–0.07)
Basophils Absolute: 0 10*3/uL (ref 0.0–0.1)
Basophils Relative: 1 %
Eosinophils Absolute: 0 10*3/uL (ref 0.0–0.5)
Eosinophils Relative: 0 %
HCT: 31.7 % — ABNORMAL LOW (ref 36.0–46.0)
Hemoglobin: 9.1 g/dL — ABNORMAL LOW (ref 12.0–15.0)
Immature Granulocytes: 0 %
Lymphocytes Relative: 27 %
Lymphs Abs: 1.3 10*3/uL (ref 0.7–4.0)
MCH: 20.5 pg — ABNORMAL LOW (ref 26.0–34.0)
MCHC: 28.7 g/dL — ABNORMAL LOW (ref 30.0–36.0)
MCV: 71.4 fL — ABNORMAL LOW (ref 80.0–100.0)
Monocytes Absolute: 0.6 10*3/uL (ref 0.1–1.0)
Monocytes Relative: 12 %
Neutro Abs: 2.9 10*3/uL (ref 1.7–7.7)
Neutrophils Relative %: 60 %
Platelets: 281 10*3/uL (ref 150–400)
RBC: 4.44 MIL/uL (ref 3.87–5.11)
RDW: 24.6 % — ABNORMAL HIGH (ref 11.5–15.5)
Smear Review: NORMAL
WBC: 4.8 10*3/uL (ref 4.0–10.5)
nRBC: 0 % (ref 0.0–0.2)

## 2023-01-26 LAB — URINALYSIS, ROUTINE W REFLEX MICROSCOPIC
Bilirubin Urine: NEGATIVE
Glucose, UA: NEGATIVE mg/dL
Ketones, ur: NEGATIVE mg/dL
Nitrite: NEGATIVE
Protein, ur: NEGATIVE mg/dL
Specific Gravity, Urine: 1.02 (ref 1.005–1.030)
pH: 7 (ref 5.0–8.0)

## 2023-01-26 LAB — URINALYSIS, MICROSCOPIC (REFLEX): WBC, UA: >50 WBC/hpf (ref 0–5)

## 2023-01-26 LAB — BASIC METABOLIC PANEL
Anion gap: 9 (ref 5–15)
BUN: 21 mg/dL (ref 8–23)
CO2: 23 mmol/L (ref 22–32)
Calcium: 8.6 mg/dL — ABNORMAL LOW (ref 8.9–10.3)
Chloride: 107 mmol/L (ref 98–111)
Creatinine, Ser: 1.09 mg/dL — ABNORMAL HIGH (ref 0.44–1.00)
GFR, Estimated: 46 mL/min — ABNORMAL LOW (ref >60)
Glucose, Bld: 100 mg/dL — ABNORMAL HIGH (ref 70–99)
Potassium: 4 mmol/L (ref 3.5–5.1)
Sodium: 139 mmol/L (ref 135–145)

## 2023-01-26 MED ORDER — CEFTRIAXONE SODIUM 1 G IJ SOLR
1.0000 g | Freq: Once | INTRAMUSCULAR | Status: AC
  Administered 2023-01-26: 1 g via INTRAMUSCULAR

## 2023-01-26 MED ORDER — SODIUM CHLORIDE 0.9 % IV SOLN
1.0000 g | Freq: Once | INTRAVENOUS | Status: DC
  Filled 2023-01-26 (×2): qty 10

## 2023-01-26 MED ORDER — CEPHALEXIN 500 MG PO CAPS: 500.0000 mg | ORAL_CAPSULE | Freq: Two times a day (BID) | ORAL | 0 refills | Status: AC

## 2023-01-26 MED ORDER — LIDOCAINE HCL (PF) 1 % IJ SOLN
INTRAMUSCULAR | Status: AC
  Administered 2023-01-26: 1 mL
  Filled 2023-01-26: qty 5

## 2023-01-26 NOTE — ED Notes (Signed)
Patient transported to CT 

## 2023-01-26 NOTE — Discharge Instructions (Addendum)
Please follow-up with your primary care provider regards recent ER visit.  Today your labs and imaging show that you have a UTI and I have given you some antibiotics.  Please take these antibiotics as prescribed and follow-up with your primary care provider.  If symptoms worsen please return to the ER.

## 2023-01-26 NOTE — ED Provider Notes (Signed)
[place] EM[facility] DEPARTMENT AT [facility] Provider Note   CSN: [id number] Arrival date & time: 01/26/23  1313     History  Chief Complaint  Patient presents with   Dysuria    Stephanie Foster is a 89 y.o. female with recent hospitalization for GI bleed, history of stroke, hypertension, GERD, paroxysmal A-fib, CKD stage III presented for suprapubic pain along with right flank pain that began last night.  Patient is feels similar to previous stones however she only had the flank pain for a few moments and has resolved.  Patient notes that she is having burning when she urinates but denies hematuria.  Patient has abdominal pain, nausea/vomiting.  Patient took 1 ciprofloxacin from her daughter who had a leftover and states that did not help.  Patient denies fevers.  Home Medications Prior to Admission medications   Medication Sig Start Date End Date Taking? Authorizing Provider  cephALEXin (KEFLEX) 500 MG capsule Take 1 capsule (500 mg total) by mouth 2 (two) times daily for 7 days. 01/26/23 02/02/23 Yes Ikeisha Blumberg, Beverly Gust, PA-C  pantoprazole (PROTONIX) 40 MG tablet Take by mouth. 01/12/23 01/12/24 Yes [provider]  atorvastatin (LIPITOR) 20 MG tablet Take 20 mg by mouth daily.    [provider]  Cranberry-Vitamin C-Vitamin E 4200-20-3 MG-MG-UNIT CAPS Take 1 tablet by mouth daily.    [provider]  diltiazem (CARDIZEM CD) 120 MG 24 hr capsule Take 1 capsule (120 mg total) by mouth 2 (two) times daily. Patient taking differently: Take 120 mg by mouth in the morning and at bedtime. 05/24/22   [name], MD  iron polysaccharides (NIFEREX) 150 MG capsule Take 150 mg by mouth every other day.    [provider]  levothyroxine (SYNTHROID) 25 MCG tablet Take 0.5 tablets (12.5 mcg total) by mouth daily before breakfast. 01/09/23 04/09/23  Narda Bonds, MD  pantoprazole (PROTONIX) 40 MG tablet Take 1 tablet (40 mg total) by mouth daily.  01/09/23 04/09/23  Narda Bonds, MD  Vitamin D, Ergocalciferol, (DRISDOL) 1.25 MG (50000 UNIT) CAPS capsule Take 50,000 Units by mouth once a week. 01/03/23   [provider]      Allergies    Ciprofloxacin, Dextromethorphan-guaifenesin, Aspartame and phenylalanine, Codeine, Diovan [valsartan], Flagyl [metronidazole], Macrobid [nitrofurantoin macrocrystal], Phenylalanine, Prednisone, Sulfasalazine, Vitasana [actical], Aspirin, Doxycycline, and Sulfa antibiotics    Review of Systems   Review of Systems  Genitourinary:  Positive for dysuria.    Physical Exam Updated Vital Signs BP (!) 151/76   Pulse 92   Temp 98.1 F (36.7 C) (Oral)   Resp 20   Ht 5' (1.524 m)   Wt 66.2 kg   SpO2 97%   BMI 28.50 kg/m  Physical Exam Vitals reviewed.  Constitutional:      General: She is not in acute distress. HENT:     Head: Normocephalic and atraumatic.  Eyes:     Extraocular Movements: Extraocular movements intact.     Conjunctiva/sclera: Conjunctivae normal.     Pupils: Pupils are equal, round, and reactive to light.  Cardiovascular:     Rate and Rhythm: Normal rate and regular rhythm.     Pulses: Normal pulses.     Heart sounds: Normal heart sounds.     Comments: 2+ bilateral radial/dorsalis pedis pulses with regular rate Pulmonary:     Effort: Pulmonary effort is normal. No respiratory distress.     Breath sounds: Normal breath sounds.  Abdominal:     Palpations:  Abdomen is soft.     Tenderness: There is no abdominal tenderness. There is no right CVA tenderness, left CVA tenderness, guarding or rebound.  Musculoskeletal:        General: Normal range of motion.     Cervical back: Normal range of motion and neck supple.     Comments: 5 out of 5 bilateral grip/leg extension strength  Skin:    General: Skin is warm and dry.     Capillary Refill: Capillary refill takes less than 2 seconds.  Neurological:     General: No focal deficit present.     Mental Status: She is  alert and oriented to person, place, and time.     Comments: Sensation intact in all 4 limbs  Psychiatric:        Mood and Affect: Mood normal.     ED Results / Procedures / Treatments   Labs (all labs ordered are listed, but only abnormal results are displayed) Labs Reviewed  URINALYSIS, ROUTINE W REFLEX MICROSCOPIC - Abnormal; Notable for the following components:      Result Value   APPearance CLOUDY (*)    Hgb urine dipstick TRACE (*)    Leukocytes,Ua LARGE (*)    All other components within normal limits  URINALYSIS, MICROSCOPIC (REFLEX) - Abnormal; Notable for the following components:   Bacteria, UA FEW (*)    All other components within normal limits  CBC WITH DIFFERENTIAL/PLATELET - Abnormal; Notable for the following components:   Hemoglobin 9.1 (*)    HCT 31.7 (*)    MCV 71.4 (*)    MCH 20.5 (*)    MCHC 28.7 (*)    RDW 24.6 (*)    All other components within normal limits  BASIC METABOLIC PANEL - Abnormal; Notable for the following components:   Glucose, Bld 100 (*)    Creatinine, Ser 1.09 (*)    Calcium 8.6 (*)    GFR, Estimated 46 (*)    All other components within normal limits  URINE CULTURE    EKG None  Radiology CT Renal Stone Study  Result Date: 01/26/2023 CLINICAL DATA:  One day history of right flank pain and incomplete bladder emptying EXAM: CT ABDOMEN AND PELVIS WITHOUT CONTRAST TECHNIQUE: Multidetector CT imaging of the abdomen and pelvis was performed following the standard protocol without IV contrast. RADIATION DOSE REDUCTION: This exam was performed according to the departmental dose-optimization program which includes automated exposure control, adjustment of the mA and/or kV according to patient size and/or use of iterative reconstruction technique. COMPARISON:  CT abdomen and pelvis dated 01/07/2023, 10/15/2016 FINDINGS: Lower chest: Unchanged 6 x 3 mm right lower lobe nodule (303:12). Follow-up per recent CT chest. Right lower lobe traction  bronchiectasis. No pleural effusion or pneumothorax demonstrated. Left atrial enlargement. Coronary artery calcifications. Hepatobiliary: 1.1 cm segment 2/3 (301:27) and 1.4 x 0.9 cm peripheral segment 2 (601:94) hypodensities are unchanged dating back to 2018, likely cysts or hemangiomas. No intra or extrahepatic biliary ductal dilation. Normal gallbladder. Pancreas: No focal lesions or main ductal dilation. Spleen: Normal in size without focal abnormality. Adrenals/Urinary Tract: No adrenal nodules. No suspicious renal masses by noncontrast technique. No hydronephrosis. Punctate nonobstructing left renal stones. Simple-appearing left parapelvic cysts. Right renal cortical scarring. No focal bladder wall thickening. Mild pericystic stranding. Stomach/Bowel: Similar large hiatal hernia containing nearly the entire stomach, which appears mesenteroaxially rotated. No evidence of bowel wall thickening, distention, or inflammatory changes. Extensive colonic diverticulosis without acute diverticulitis. Normal appendix. Vascular/Lymphatic: Aortic atherosclerosis. No  enlarged abdominal or pelvic lymph nodes. Reproductive: No adnexal masses. Other: No free fluid, fluid collection, or free air. Musculoskeletal: No acute or abnormal lytic or blastic osseous lesions. Partially imaged pectus excavatum. Multilevel degenerative changes of the partially imaged thoracic and lumbar spine. Unchanged grade 1 anterolisthesis at L4-5. IMPRESSION: 1. Mild pericystic stranding, which may be seen in the setting of cystitis. 2. Punctate nonobstructing left renal stones. No hydronephrosis. 3. Similar large hiatal hernia containing nearly the entire stomach. 4. Extensive colonic diverticulosis without acute diverticulitis. 5.  Aortic Atherosclerosis (ICD10-I70.0). Electronically Signed   By: Agustin Cree M.D.   On: 01/26/2023 16:15    Procedures Procedures    Medications Ordered in ED Medications  cefTRIAXone (ROCEPHIN) 1 g in sodium  chloride 0.9 % 100 mL IVPB (has no administration in time range)  lidocaine (PF) (XYLOCAINE) 1 % injection (has no administration in time range)    ED Course/ Medical Decision Making/ A&P                                 Medical Decision Making Amount and/or Complexity of Data Reviewed Labs: ordered. Radiology: ordered.  Risk Prescription drug management.   Lynnae Prude 89 y.o. presented today for dysuria. Working DDx that I considered at this time includes, but not limited to, UTI, pyelonephritis, urosepsis.  R/o DDx: Pyelonephritis, urosepsis, septic stone: These are considered less likely due to history of present illness, physical exam, labs/imaging findings  Review of prior external notes: 01/07/2023 discharge summary  Unique Tests and My Interpretation:  UA: Pyuria noted CBC: Unremarkable, improved from previous reading Urine culture: Pending BMP: Unremarkable, improved from previous reading CT renal stone study:  Social Determinants of Health: none  Discussion with Independent Historian:  Husband  Discussion of Management of Tests: None  Risk: Medium: prescription drug management  Risk Stratification Score: None  Staffed with Wallace Cullens, DO  Plan: On exam patient was in no acute distress with stable vitals.  Patient's physical exam did not show any remarkable findings however patient does endorse right flank pain that was similar to previous kidney stones and does have a UTI and so we will get labs and imaging to rule out UTI with stone.  Patient not requiring pain meds at this time.  Patient's labs and imaging are all reassuring.  At this time patient does have a UTI and will give 1 dose of Rocephin here and prescribe Keflex and follow-up with her primary care provider.  Patient is in agreement with this plan and is asking to be discharged at this time.  Patient was given return precautions. Patient stable for discharge at this time.  Patient verbalized understanding  of plan.  This chart was dictated using voice recognition software.  Despite best efforts to proofread,  errors can occur which can change the documentation meaning.         Final Clinical Impression(s) / ED Diagnoses Final diagnoses:  Acute cystitis with hematuria    Rx / DC Orders ED Discharge Orders          Ordered    cephALEXin (KEFLEX) 500 MG capsule  2 times daily        01/26/23 1631              Netta Corrigan, PA-C 01/26/23 1635    Sloan Leiter, DO 01/29/23 [phone]

## 2023-01-26 NOTE — ED Triage Notes (Signed)
Pt states right sided flank pain and bladder not emptying well since yesterda, concern for UTI or kidney stone  Took Cipro yesterday but it didn't get better

## 2023-01-27 LAB — URINE CULTURE: Culture: NO GROWTH

## 2023-02-24 ENCOUNTER — Other Ambulatory Visit: Payer: Self-pay | Admitting: Cardiology

## 2023-03-07 ENCOUNTER — Encounter (HOSPITAL_BASED_OUTPATIENT_CLINIC_OR_DEPARTMENT_OTHER): Payer: Self-pay | Admitting: Radiology

## 2023-03-07 ENCOUNTER — Emergency Department (HOSPITAL_BASED_OUTPATIENT_CLINIC_OR_DEPARTMENT_OTHER): Payer: Medicare Other

## 2023-03-07 ENCOUNTER — Emergency Department (HOSPITAL_BASED_OUTPATIENT_CLINIC_OR_DEPARTMENT_OTHER)
Admission: EM | Admit: 2023-03-07 | Discharge: 2023-03-07 | Disposition: A | Discharge status: Home / Self Care | Payer: Medicare Other

## 2023-03-07 ENCOUNTER — Other Ambulatory Visit: Payer: Self-pay

## 2023-03-07 DIAGNOSIS — S0083XA Contusion of other part of head, initial encounter: Secondary | ICD-10-CM | POA: Insufficient documentation

## 2023-03-07 DIAGNOSIS — M25561 Pain in right knee: Secondary | ICD-10-CM | POA: Insufficient documentation

## 2023-03-07 DIAGNOSIS — W01198A Fall on same level from slipping, tripping and stumbling with subsequent striking against other object, initial encounter: Secondary | ICD-10-CM | POA: Diagnosis not present

## 2023-03-07 DIAGNOSIS — M542 Cervicalgia: Secondary | ICD-10-CM | POA: Diagnosis not present

## 2023-03-07 DIAGNOSIS — S60052A Contusion of left little finger without damage to nail, initial encounter: Secondary | ICD-10-CM | POA: Diagnosis not present

## 2023-03-07 DIAGNOSIS — R519 Headache, unspecified: Secondary | ICD-10-CM | POA: Diagnosis present

## 2023-03-07 NOTE — ED Triage Notes (Signed)
Pt was reaching for something and lost her balance falling onto a ceramic floor. Pt fell face first onto the floor and has significant swelling to her forehead. PT complaining of right knee pain and left hand pain and head pain. No LOC and denies use of blood thinners. Pt walks with a cane.

## 2023-03-07 NOTE — Discharge Instructions (Signed)
Please follow-up with your primary doctor.  Turn to emergency department immediately if develop sudden onset headache, vision changes, facial droop, seizures, lethargy, chest pain, shortness of breath, abdominal pain or any new or worsening symptoms that are concerning to you.

## 2023-03-07 NOTE — ED Provider Notes (Signed)
[facility] Provider Note   CSN: [id number] Arrival date & time: 03/07/23  2132     History  Chief Complaint  Patient presents with   Stephanie Foster is a 89 y.o. female.  This is a 89 year old female presenting emergency department after a fall.  Was mechanical.  She notes that she lost her balance fell as she was ambulating without her cane.  Fell into couch and then could not catch herself and hit her forehead on floor.  Complains of pain to the forehead involves her left hand.  No chest pain or shortness of breath no abdominal pain.  Also complains of chronic pain to her right knee which is largely unchanged from her baseline.  Son is at bedside reports mother is at her baseline mentation.   Fall       Home Medications Prior to Admission medications   Medication Sig Start Date End Date Taking? Authorizing Provider  atorvastatin (LIPITOR) 20 MG tablet Take 20 mg by mouth daily.    [provider]  Cranberry-Vitamin C-Vitamin E 4200-20-3 MG-MG-UNIT CAPS Take 1 tablet by mouth daily.    [provider]  diltiazem (CARDIZEM CD) 120 MG 24 hr capsule TAKE 1 CAPSULE(120 MG) BY MOUTH TWICE DAILY 02/24/23   [name], MD  iron polysaccharides (NIFEREX) 150 MG capsule Take 150 mg by mouth every other day.    [provider]  levothyroxine (SYNTHROID) 25 MCG tablet Take 0.5 tablets (12.5 mcg total) by mouth daily before breakfast. 01/09/23 04/09/23  Narda Bonds, MD  pantoprazole (PROTONIX) 40 MG tablet Take 1 tablet (40 mg total) by mouth daily. 01/09/23 04/09/23  Narda Bonds, MD  pantoprazole (PROTONIX) 40 MG tablet Take by mouth. 01/12/23 01/12/24  [provider]  Vitamin D, Ergocalciferol, (DRISDOL) 1.25 MG (50000 UNIT) CAPS capsule Take 50,000 Units by mouth once a week. 01/03/23   [provider]      Allergies    Ciprofloxacin, Dextromethorphan-guaifenesin, Aspartame  and phenylalanine, Codeine, Diovan [valsartan], Flagyl [metronidazole], Macrobid [nitrofurantoin macrocrystal], Phenylalanine, Prednisone, Sulfasalazine, Vitasana [actical], Aspirin, Doxycycline, and Sulfa antibiotics    Review of Systems   Review of Systems  Physical Exam Updated Vital Signs BP (!) 142/63 (BP Location: Right Arm)   Pulse 91   Temp 97.8 F (36.6 C)   Resp 20   Ht 5' (1.524 m)   Wt 66.2 kg   SpO2 98%   BMI 28.51 kg/m  Physical Exam Vitals and nursing note reviewed.  Constitutional:      General: She is not in acute distress.    Appearance: She is not toxic-appearing.  HENT:     Head: Normocephalic.     Comments: Large 6 cm hematoma to forehead.  Pupils equal round reactive to light.  No other injury.  No septal hematoma.      Nose: Nose normal.     Mouth/Throat:     Mouth: Mucous membranes are moist.  Eyes:     Conjunctiva/sclera: Conjunctivae normal.  Neck:     Comments: Diffuse tenderness, does not appear to localize to midline. Cardiovascular:     Rate and Rhythm: Normal rate and regular rhythm.     Pulses: Normal pulses.  Pulmonary:     Effort: Pulmonary effort is normal.     Breath sounds: Normal breath sounds.  Abdominal:     General: Abdomen is flat. There is no distension.  Palpations: Abdomen is soft.     Tenderness: There is no abdominal tenderness. There is no guarding or rebound.  Musculoskeletal:     Cervical back: Normal range of motion. Tenderness present.     Comments: Chest wall stable nontender.  Pelvis stable nontender.  No midline spinal tenderness.  Does have a small skin tear to her left hand and some bruising to her left pinky finger.  No numbness tingling changes in sensation.  Neurovascular intact in her upper and lower extremities.  Has some diffuse tenderness to her right knee with palpation.  Skin:    General: Skin is warm and dry.     Capillary Refill: Capillary refill takes less than 2 seconds.  Neurological:     Mental  Status: She is alert. Mental status is at baseline.  Psychiatric:        Mood and Affect: Mood normal.        Behavior: Behavior normal.     ED Results / Procedures / Treatments   Labs (all labs ordered are listed, but only abnormal results are displayed) Labs Reviewed - No data to display  EKG None  Radiology CT Cervical Spine Wo Contrast Result Date: 03/07/2023 CLINICAL DATA:  Larey Seat, head and neck trauma EXAM: CT CERVICAL SPINE WITHOUT CONTRAST TECHNIQUE: Multidetector CT imaging of the cervical spine was performed without intravenous contrast. Multiplanar CT image reconstructions were also generated. RADIATION DOSE REDUCTION: This exam was performed according to the departmental dose-optimization program which includes automated exposure control, adjustment of the mA and/or kV according to patient size and/or use of iterative reconstruction technique. COMPARISON:  None Available. FINDINGS: Alignment: Alignment is grossly anatomic. Skull base and vertebrae: No acute fracture. No primary bone lesion or focal pathologic process. Soft tissues and spinal canal: No prevertebral fluid or swelling. No visible canal hematoma. Disc levels: Severe hypertrophic changes at the C1-C2 interface. There is diffuse facet hypertrophy greatest from C2-3 through C4-5. Mild multilevel spondylosis greatest at the C5-6 level. Upper chest: Central airway is patent.  Lung apices are clear. Other: Reconstructed images demonstrate no additional findings. IMPRESSION: 1. No acute cervical spine fracture. 2. Extensive multilevel cervical degenerative changes. Electronically Signed   By: Sharlet Salina M.D.   On: 03/07/2023 22:32   CT Head Wo Contrast Result Date: 03/07/2023 CLINICAL DATA:  Larey Seat, head and neck trauma EXAM: CT HEAD WITHOUT CONTRAST TECHNIQUE: Contiguous axial images were obtained from the base of the skull through the vertex without intravenous contrast. RADIATION DOSE REDUCTION: This exam was performed  according to the departmental dose-optimization program which includes automated exposure control, adjustment of the mA and/or kV according to patient size and/or use of iterative reconstruction technique. COMPARISON:  02/22/2020 FINDINGS: Brain: Chronic small vessel ischemic changes are seen throughout the periventricular white matter. No evidence of acute infarct or hemorrhage. Lateral ventricles and remaining midline structures are unremarkable. No acute extra-axial fluid collections. Stable calcified left frontal meningioma measuring up to 2.3 cm. No mass effect. Vascular: No hyperdense vessel or unexpected calcification. Skull: Midline frontal scalp hematoma. No underlying fracture. The remainder of the calvarium is unremarkable. Sinuses/Orbits: No acute finding. Other: None. IMPRESSION: 1. Midline frontal scalp hematoma.  No underlying fracture. 2. No acute intracranial process. 3. Stable calcified left frontal convexity meningioma. No mass effect. Electronically Signed   By: Sharlet Salina M.D.   On: 03/07/2023 22:29   DG Hand Complete Left Result Date: 03/07/2023 CLINICAL DATA:  Fall with hand pain EXAM: LEFT HAND - COMPLETE 3+  VIEW COMPARISON:  None Available. FINDINGS: No fracture or malalignment. Moderate degenerative changes at the first [facility] joint and STT interval. Mild joint space narrowing at the PIP and first through third MCP joints. IMPRESSION: No acute osseous abnormality. Degenerative changes as above. Electronically Signed   By: Jasmine Pang M.D.   On: 03/07/2023 22:14   DG Knee Complete 4 Views Right Result Date: 03/07/2023 CLINICAL DATA:  Larey Seat, right knee pain EXAM: RIGHT KNEE - COMPLETE 4+ VIEW COMPARISON:  None Available. FINDINGS: Frontal, bilateral oblique, lateral views of the right knee are obtained. No acute fracture, subluxation, or dislocation. Three compartmental osteoarthritis greatest in the medial compartment with severe joint space narrowing. No joint effusion. Soft tissues  are unremarkable. IMPRESSION: 1. Severe 3 compartmental osteoarthritis. 2. No acute fracture. Electronically Signed   By: Sharlet Salina M.D.   On: 03/07/2023 22:10    Procedures Procedures    Medications Ordered in ED Medications - No data to display  ED Course/ Medical Decision Making/ A&P Clinical Course as of 03/07/23 2258  Mon Mar 07, 2023  2252 CT scan and x-rays negative.  Stable for discharge. [TY]    Clinical Course User Index [TY] Coral Spikes, DO                                 Medical Decision Making Is a well-appearing 89 year old female presenting emergency department for evaluation after a fall.  Hit her head.  Per chart review does not appear to be taking any blood thinners.  She is at her neurologic baseline per family member.  Considered labs, however fall seemingly was mechanical.  Patient with stable vitals and reassuring exam.  Low suspicion for acute traumatic intra-abdominal pathology and given that fall was mechanical low suspicion for organic/metabolic cause that would be revealed by lab workup.  Given her advanced age we will get CT head as well as plain films.  Offered pain medications.  Patient declined.  If imaging negative will discharge home.  See ED course for final MDM/disposition.  Amount and/or Complexity of Data Reviewed Radiology: ordered.         Final Clinical Impression(s) / ED Diagnoses Final diagnoses:  Contusion of face, initial encounter    Rx / DC Orders ED Discharge Orders     None         Coral Spikes, DO 03/07/23 2258

## 2023-08-03 ENCOUNTER — Emergency Department (HOSPITAL_BASED_OUTPATIENT_CLINIC_OR_DEPARTMENT_OTHER)

## 2023-08-03 ENCOUNTER — Encounter (HOSPITAL_BASED_OUTPATIENT_CLINIC_OR_DEPARTMENT_OTHER): Payer: Self-pay

## 2023-08-03 ENCOUNTER — Emergency Department (HOSPITAL_BASED_OUTPATIENT_CLINIC_OR_DEPARTMENT_OTHER)
Admission: EM | Admit: 2023-08-03 | Discharge: 2023-08-03 | Disposition: A | Discharge status: Home / Self Care | Attending: Emergency Medicine | Admitting: Emergency Medicine

## 2023-08-03 ENCOUNTER — Other Ambulatory Visit: Payer: Self-pay

## 2023-08-03 DIAGNOSIS — R609 Edema, unspecified: Secondary | ICD-10-CM | POA: Diagnosis not present

## 2023-08-03 DIAGNOSIS — R0602 Shortness of breath: Secondary | ICD-10-CM | POA: Insufficient documentation

## 2023-08-03 DIAGNOSIS — M7989 Other specified soft tissue disorders: Secondary | ICD-10-CM | POA: Diagnosis present

## 2023-08-03 DIAGNOSIS — I10 Essential (primary) hypertension: Secondary | ICD-10-CM | POA: Insufficient documentation

## 2023-08-03 LAB — BASIC METABOLIC PANEL WITH GFR
Anion gap: 12 (ref 5–15)
BUN: 18 mg/dL (ref 8–23)
CO2: 25 mmol/L (ref 22–32)
Calcium: 9.3 mg/dL (ref 8.9–10.3)
Chloride: 105 mmol/L (ref 98–111)
Creatinine, Ser: 0.95 mg/dL (ref 0.44–1.00)
GFR, Estimated: 55 mL/min — ABNORMAL LOW (ref >60)
Glucose, Bld: 115 mg/dL — ABNORMAL HIGH (ref 70–99)
Potassium: 3.8 mmol/L (ref 3.5–5.1)
Sodium: 142 mmol/L (ref 135–145)

## 2023-08-03 LAB — CBC
HCT: 35.5 % — ABNORMAL LOW (ref 36.0–46.0)
Hemoglobin: 10.3 g/dL — ABNORMAL LOW (ref 12.0–15.0)
MCH: 20 pg — ABNORMAL LOW (ref 26.0–34.0)
MCHC: 29 g/dL — ABNORMAL LOW (ref 30.0–36.0)
MCV: 68.8 fL — ABNORMAL LOW (ref 80.0–100.0)
Platelets: 213 10*3/uL (ref 150–400)
RBC: 5.16 MIL/uL — ABNORMAL HIGH (ref 3.87–5.11)
RDW: 22.7 % — ABNORMAL HIGH (ref 11.5–15.5)
WBC: 3.8 10*3/uL — ABNORMAL LOW (ref 4.0–10.5)
nRBC: 0 % (ref 0.0–0.2)

## 2023-08-03 LAB — PRO BRAIN NATRIURETIC PEPTIDE: Pro Brain Natriuretic Peptide: 710 pg/mL — ABNORMAL HIGH (ref <300.0)

## 2023-08-03 MED ORDER — FUROSEMIDE 20 MG PO TABS: 20.0000 mg | ORAL_TABLET | Freq: Every day | ORAL | 0 refills | Status: DC

## 2023-08-03 MED ORDER — FUROSEMIDE 20 MG PO TABS
20.0000 mg | ORAL_TABLET | Freq: Once | ORAL | Status: AC
  Administered 2023-08-03: 20 mg via ORAL
  Filled 2023-08-03: qty 1

## 2023-08-03 NOTE — ED Notes (Signed)
 Discharge instructions reviewed with patient. Patient verbalizes understanding, no further questions at this time. Medications/prescriptions and follow up information provided. No acute distress noted at time of departure.

## 2023-08-03 NOTE — Discharge Instructions (Signed)
 You were seen in the emergency room today with leg swelling and some trouble breathing.  I am putting you on 3 days of Lasix .  We have given the first tablet today.  You will take the next dose tomorrow.  Please call your primary care doctor today and schedule a follow-up appointment in the next week.  They will need to repeat some blood work and see how you are doing.  If you develop worsening shortness of breath despite this treatment return to the emergency department for reevaluation.

## 2023-08-03 NOTE — ED Provider Notes (Signed)
 Emergency Department Provider Note   I have reviewed the triage vital signs and the nursing notes.   HISTORY  Chief Complaint Shortness of Breath   HPI Stephanie Foster is a 89 y.o. female presents to the emergency department for evaluation of intermittent leg swelling with orthopnea.  Patient is been sleeping in a recliner for years.  She has never been able to fully lay flat while sleeping.  She has noticed some swelling in her legs which has been intermittently worsening.  Last night she had particularly swollen legs but this resolved with laying down and elevating them.  They are much improved today.  She is also noted some shortness of breath worsening over the past several days.  No fever or cough.  No chest discomfort.  No heart palpitations.  She is currently feeling well and not short of breath.  Past Medical History:  Diagnosis Date   Cataract    Chest pain 03/24/2012   Diaphoresis 03/24/2012   Diverticulosis    Dry eye syndrome of both lacrimal glands 11/16/2017   Dyslipidemia 02/28/2020   Dyspnea on exertion 02/28/2020   Essential hypertension 02/28/2020   GERD (gastroesophageal reflux disease)    H/O hiatal hernia    Hernia    Hypertension    Late effect of cerebrovascular accident (CVA) 02/28/2020   Nausea 03/24/2012   Nonexudative age-related macular degeneration, bilateral, early dry stage 11/16/2017   Pseudophakia of both eyes 11/16/2017   Stroke (HCC) 02/22/2020    Review of Systems  Constitutional: No fever/chills Cardiovascular: Denies chest pain. Positive leg swelling.  Respiratory: Positive shortness of breath. Gastrointestinal: No abdominal pain.  No nausea, no vomiting.  No diarrhea.  Skin: Negative for rash. Neurological: Negative for headaches, focal weakness or numbness.  ____________________________________________   PHYSICAL EXAM:  VITAL SIGNS: ED Triage Vitals  Encounter Vitals Group     BP 08/03/23 0820 134/76     Pulse Rate 08/03/23 0820 (!) 105      Resp 08/03/23 0820 (!) 24     Temp 08/03/23 0820 97.8 F (36.6 C)     Temp Source 08/03/23 0820 Oral     SpO2 08/03/23 0820 97 %     Weight 08/03/23 0818 151 lb 7.3 oz (68.7 kg)     Height 08/03/23 0818 5' (1.524 m)   Constitutional: Alert and oriented. Well appearing and in no acute distress. Eyes: Conjunctivae are normal. Head: Atraumatic. Nose: No congestion/rhinnorhea. Mouth/Throat: Mucous membranes are moist.   Neck: No stridor.  Cardiovascular: Normal rate, regular rhythm. Good peripheral circulation. Grossly normal heart sounds.   Respiratory: Normal respiratory effort.  No retractions. Lungs CTAB. Gastrointestinal: Soft and nontender. No distention.  Musculoskeletal: No lower extremity tenderness with trace pitting edema bilaterally. No gross deformities of extremities. Neurologic:  Normal speech and language. No gross focal neurologic deficits are appreciated.  Skin:  Skin is warm, dry and intact. No rash noted.  ____________________________________________   LABS (all labs ordered are listed, but only abnormal results are displayed)  Labs Reviewed  BASIC METABOLIC PANEL WITH GFR - Abnormal; Notable for the following components:      Result Value   Glucose, Bld 115 (*)    GFR, Estimated 55 (*)    All other components within normal limits  CBC - Abnormal; Notable for the following components:   WBC 3.8 (*)    RBC 5.16 (*)    Hemoglobin 10.3 (*)    HCT 35.5 (*)    MCV 68.8 (*)  MCH 20.0 (*)    MCHC 29.0 (*)    RDW 22.7 (*)    All other components within normal limits  PRO BRAIN NATRIURETIC PEPTIDE - Abnormal; Notable for the following components:   Pro Brain Natriuretic Peptide 710.0 (*)    All other components within normal limits   ____________________________________________  EKG   EKG Interpretation Date/Time:  Wednesday August 03 2023 08:23:46 EDT Ventricular Rate:  106 PR Interval:    QRS Duration:  79 QT Interval:  411 QTC Calculation: 503 R  Axis:   -48  Text Interpretation: Atrial fibrillation Ventricular premature complex Left axis deviation Low voltage, extremity and precordial leads Anteroseptal infarct, old Prolonged QT interval Confirmed by Darra Chew [phone]) on 08/03/2023 8:47:33 AM        ____________________________________________   PROCEDURES  Procedure(s) performed:   Procedures  None ____________________________________________   INITIAL IMPRESSION / ASSESSMENT AND PLAN / ED COURSE  Pertinent labs & imaging results that were available during my care of the patient were reviewed by me and considered in my medical decision making (see chart for details).   This patient is Presenting for Evaluation of SOB, which does require a range of treatment options, and is a complaint that involves a high risk of morbidity and mortality.  The Differential Diagnoses include fluid retention, pulmonary edema, ACS, PE, CAP, viral process, etc.  Critical Interventions-    Medications  furosemide  (LASIX ) tablet 20 mg (20 mg Oral Given 08/03/23 1150)    Reassessment after intervention:  symptoms improve.d    I did obtain Additional Historical Information from husband at bedside.    Clinical Laboratory Tests Ordered, included BMP without AKI. Mild anemia. BNP elevated.   Radiologic Tests Ordered, included CXR. I independently interpreted the images and agree with radiology interpretation.   Cardiac Monitor Tracing which shows NSR.    Social Determinants of Health Risk patient is a non-smoker.   Medical Decision Making: Summary:  The patient presents to the emergency department for evaluation of shortness of breath and swelling of the legs intermittently.  Clinically seems well without hypoxemia or increased respiratory effort.  Lungs are clear.  Patient in A-fib.  She was taken off of anticoagulant due to GI bleeding.  No recent GI bleeding.  Patient looking well.  Reevaluation with update and discussion with  patient.  She is looking overall well with no hypoxemia.  No difficulty breathing or altered mental status.  She does appear to have some mild fluid overload.  Considered admission and discussed this with her but she would like to try Lasix  at home with close follow-up as opposed to admission at this time. Strict ED return precautions discussed.   Patient's presentation is most consistent with acute presentation with potential threat to life or bodily function.   Disposition: discharge  ____________________________________________  FINAL CLINICAL IMPRESSION(S) / ED DIAGNOSES  Final diagnoses:  Fluid retention     NEW OUTPATIENT MEDICATIONS STARTED DURING THIS VISIT:  Discharge Medication List as of 08/03/2023 11:50 AM     START taking these medications   Details  furosemide  (LASIX ) 20 MG tablet Take 1 tablet (20 mg total) by mouth daily for 2 days., Starting Thu 08/04/2023, Until Sat 08/06/2023, Normal        Note:  This document was prepared using Dragon voice recognition software and may include unintentional dictation errors.  Chew Darra, MD, [facility]    Valentino Saavedra, Chew MATSU, MD 08/12/23 [phone]

## 2023-08-03 NOTE — ED Triage Notes (Signed)
 Shortness of breath at night, increased BLE edema since yesterday. Family states her SpO2 was 93%.

## 2023-08-08 ENCOUNTER — Emergency Department (HOSPITAL_BASED_OUTPATIENT_CLINIC_OR_DEPARTMENT_OTHER)

## 2023-08-08 ENCOUNTER — Observation Stay (HOSPITAL_BASED_OUTPATIENT_CLINIC_OR_DEPARTMENT_OTHER)
Admission: EM | Admit: 2023-08-08 | Discharge: 2023-08-10 | Disposition: A | Discharge status: Home Health | Attending: Internal Medicine | Admitting: Internal Medicine

## 2023-08-08 ENCOUNTER — Encounter (HOSPITAL_BASED_OUTPATIENT_CLINIC_OR_DEPARTMENT_OTHER): Payer: Self-pay

## 2023-08-08 ENCOUNTER — Other Ambulatory Visit: Payer: Self-pay

## 2023-08-08 DIAGNOSIS — I5033 Acute on chronic diastolic (congestive) heart failure: Secondary | ICD-10-CM | POA: Diagnosis not present

## 2023-08-08 DIAGNOSIS — I480 Paroxysmal atrial fibrillation: Secondary | ICD-10-CM | POA: Diagnosis not present

## 2023-08-08 DIAGNOSIS — E039 Hypothyroidism, unspecified: Secondary | ICD-10-CM | POA: Insufficient documentation

## 2023-08-08 DIAGNOSIS — D649 Anemia, unspecified: Secondary | ICD-10-CM | POA: Insufficient documentation

## 2023-08-08 DIAGNOSIS — E873 Alkalosis: Secondary | ICD-10-CM

## 2023-08-08 DIAGNOSIS — K219 Gastro-esophageal reflux disease without esophagitis: Secondary | ICD-10-CM | POA: Diagnosis not present

## 2023-08-08 DIAGNOSIS — R531 Weakness: Secondary | ICD-10-CM

## 2023-08-08 DIAGNOSIS — I10 Essential (primary) hypertension: Secondary | ICD-10-CM | POA: Diagnosis present

## 2023-08-08 DIAGNOSIS — I130 Hypertensive heart and chronic kidney disease with heart failure and stage 1 through stage 4 chronic kidney disease, or unspecified chronic kidney disease: Principal | ICD-10-CM | POA: Insufficient documentation

## 2023-08-08 DIAGNOSIS — R110 Nausea: Secondary | ICD-10-CM | POA: Diagnosis not present

## 2023-08-08 DIAGNOSIS — I4819 Other persistent atrial fibrillation: Secondary | ICD-10-CM | POA: Diagnosis present

## 2023-08-08 DIAGNOSIS — H353131 Nonexudative age-related macular degeneration, bilateral, early dry stage: Secondary | ICD-10-CM | POA: Diagnosis present

## 2023-08-08 DIAGNOSIS — N183 Chronic kidney disease, stage 3 unspecified: Secondary | ICD-10-CM | POA: Diagnosis present

## 2023-08-08 DIAGNOSIS — R2689 Other abnormalities of gait and mobility: Secondary | ICD-10-CM | POA: Diagnosis not present

## 2023-08-08 DIAGNOSIS — I6930 Unspecified sequelae of cerebral infarction: Secondary | ICD-10-CM

## 2023-08-08 DIAGNOSIS — J810 Acute pulmonary edema: Principal | ICD-10-CM | POA: Insufficient documentation

## 2023-08-08 DIAGNOSIS — E876 Hypokalemia: Secondary | ICD-10-CM | POA: Diagnosis not present

## 2023-08-08 DIAGNOSIS — R0602 Shortness of breath: Secondary | ICD-10-CM | POA: Diagnosis present

## 2023-08-08 DIAGNOSIS — K449 Diaphragmatic hernia without obstruction or gangrene: Secondary | ICD-10-CM | POA: Insufficient documentation

## 2023-08-08 DIAGNOSIS — I509 Heart failure, unspecified: Secondary | ICD-10-CM | POA: Diagnosis not present

## 2023-08-08 LAB — COMPREHENSIVE METABOLIC PANEL WITH GFR
ALT: 11 U/L (ref 0–44)
AST: 19 U/L (ref 15–41)
Albumin: 3.9 g/dL (ref 3.5–5.0)
Alkaline Phosphatase: 150 U/L — ABNORMAL HIGH (ref 38–126)
Anion gap: 12 (ref 5–15)
BUN: 20 mg/dL (ref 8–23)
CO2: 25 mmol/L (ref 22–32)
Calcium: 9 mg/dL (ref 8.9–10.3)
Chloride: 105 mmol/L (ref 98–111)
Creatinine, Ser: 0.93 mg/dL (ref 0.44–1.00)
GFR, Estimated: 56 mL/min — ABNORMAL LOW (ref >60)
Glucose, Bld: 143 mg/dL — ABNORMAL HIGH (ref 70–99)
Potassium: 3.8 mmol/L (ref 3.5–5.1)
Sodium: 142 mmol/L (ref 135–145)
Total Bilirubin: 0.3 mg/dL (ref 0.0–1.2)
Total Protein: 6.3 g/dL — ABNORMAL LOW (ref 6.5–8.1)

## 2023-08-08 LAB — URINALYSIS, ROUTINE W REFLEX MICROSCOPIC
Bilirubin Urine: NEGATIVE
Glucose, UA: NEGATIVE mg/dL
Hgb urine dipstick: NEGATIVE
Ketones, ur: NEGATIVE mg/dL
Leukocytes,Ua: NEGATIVE
Nitrite: NEGATIVE
Protein, ur: NEGATIVE mg/dL
Specific Gravity, Urine: 1.025 (ref 1.005–1.030)
pH: 5.5 (ref 5.0–8.0)

## 2023-08-08 LAB — I-STAT VENOUS BLOOD GAS, ED
Acid-Base Excess: 4 mmol/L — ABNORMAL HIGH (ref 0.0–2.0)
Bicarbonate: 28.5 mmol/L — ABNORMAL HIGH (ref 20.0–28.0)
Calcium, Ion: 1.15 mmol/L (ref 1.15–1.40)
HCT: 32 % — ABNORMAL LOW (ref 36.0–46.0)
Hemoglobin: 10.9 g/dL — ABNORMAL LOW (ref 12.0–15.0)
O2 Saturation: 77 %
Potassium: 3.9 mmol/L (ref 3.5–5.1)
Sodium: 142 mmol/L (ref 135–145)
TCO2: 30 mmol/L (ref 22–32)
pCO2, Ven: 42.7 mmHg — ABNORMAL LOW (ref 44–60)
pH, Ven: 7.433 — ABNORMAL HIGH (ref 7.25–7.43)
pO2, Ven: 41 mmHg (ref 32–45)

## 2023-08-08 LAB — CBC
HCT: 33.8 % — ABNORMAL LOW (ref 36.0–46.0)
Hemoglobin: 9.8 g/dL — ABNORMAL LOW (ref 12.0–15.0)
MCH: 20 pg — ABNORMAL LOW (ref 26.0–34.0)
MCHC: 29 g/dL — ABNORMAL LOW (ref 30.0–36.0)
MCV: 68.8 fL — ABNORMAL LOW (ref 80.0–100.0)
Platelets: 217 10*3/uL (ref 150–400)
RBC: 4.91 MIL/uL (ref 3.87–5.11)
RDW: 22.5 % — ABNORMAL HIGH (ref 11.5–15.5)
WBC: 4.2 10*3/uL (ref 4.0–10.5)
nRBC: 0 % (ref 0.0–0.2)

## 2023-08-08 LAB — TSH: TSH: 3.99 u[IU]/mL (ref 0.350–4.500)

## 2023-08-08 LAB — TROPONIN T, HIGH SENSITIVITY
Troponin T High Sensitivity: 17 ng/L (ref <19)
Troponin T High Sensitivity: 18 ng/L (ref <19)

## 2023-08-08 LAB — PRO BRAIN NATRIURETIC PEPTIDE: Pro Brain Natriuretic Peptide: 512 pg/mL — ABNORMAL HIGH (ref <300.0)

## 2023-08-08 LAB — CBG MONITORING, ED: Glucose-Capillary: 157 mg/dL — ABNORMAL HIGH (ref 70–99)

## 2023-08-08 MED ORDER — PANTOPRAZOLE SODIUM 40 MG PO TBEC
40.0000 mg | DELAYED_RELEASE_TABLET | Freq: Every day | ORAL | Status: DC
  Administered 2023-08-09 – 2023-08-10 (×2): 40 mg via ORAL
  Filled 2023-08-08 (×2): qty 1

## 2023-08-08 MED ORDER — FUROSEMIDE 10 MG/ML IJ SOLN
40.0000 mg | Freq: Once | INTRAMUSCULAR | Status: AC
  Administered 2023-08-09: 40 mg via INTRAVENOUS
  Filled 2023-08-08: qty 4

## 2023-08-08 MED ORDER — ENOXAPARIN SODIUM 30 MG/0.3ML IJ SOSY
30.0000 mg | PREFILLED_SYRINGE | INTRAMUSCULAR | Status: DC
  Administered 2023-08-08: 30 mg via SUBCUTANEOUS
  Filled 2023-08-08: qty 0.3

## 2023-08-08 MED ORDER — FUROSEMIDE 10 MG/ML IJ SOLN
20.0000 mg | Freq: Once | INTRAMUSCULAR | Status: AC
  Administered 2023-08-08: 20 mg via INTRAVENOUS
  Filled 2023-08-08: qty 2

## 2023-08-08 MED ORDER — FUROSEMIDE 10 MG/ML IJ SOLN
40.0000 mg | Freq: Once | INTRAMUSCULAR | Status: AC
  Administered 2023-08-08: 40 mg via INTRAVENOUS
  Filled 2023-08-08: qty 4

## 2023-08-08 MED ORDER — ACETAMINOPHEN 650 MG RE SUPP: 650.0000 mg | Freq: Four times a day (QID) | RECTAL | Status: DC | PRN

## 2023-08-08 MED ORDER — LEVOTHYROXINE SODIUM 25 MCG PO TABS: 12.5000 ug | ORAL_TABLET | Freq: Every day | ORAL | Status: DC

## 2023-08-08 MED ORDER — ONDANSETRON HCL 4 MG/2ML IJ SOLN: 4.0000 mg | Freq: Four times a day (QID) | INTRAMUSCULAR | Status: DC | PRN

## 2023-08-08 MED ORDER — ACETAMINOPHEN 325 MG PO TABS: 650.0000 mg | ORAL_TABLET | Freq: Four times a day (QID) | ORAL | Status: DC | PRN

## 2023-08-08 MED ORDER — ONDANSETRON HCL 4 MG PO TABS: 4.0000 mg | ORAL_TABLET | Freq: Four times a day (QID) | ORAL | Status: DC | PRN

## 2023-08-08 NOTE — ED Notes (Signed)
 Troponin redraw due to hemolyzed specimen

## 2023-08-08 NOTE — ED Notes (Addendum)
 Called [facility] to see if any beds at [facility] for transfer.  Gave information for transfer.

## 2023-08-08 NOTE — ED Triage Notes (Signed)
 Pt was seen in the ED here on 6/18 for fluid overload and ShOB. Since pt was discharged from the ED she has had ongoing generalized weakness and unable to perform ADLs. Today pt started feeling a generalized sensation of tingling. Pt still has some ongoing ShOB.

## 2023-08-08 NOTE — ED Notes (Signed)
 [facility] called for transport @17 :30.  Spoke with Luke

## 2023-08-08 NOTE — ED Provider Notes (Signed)
 I provided a substantive portion of the care of this patient.  I personally made/approved the management plan for this patient and take responsibility for the patient management.  EKG Interpretation Date/Time:  Monday August 08 2023 13:53:46 EDT Ventricular Rate:  94 PR Interval:    QRS Duration:  76 QT Interval:  310 QTC Calculation: 387 R Axis:   -38  Text Interpretation: Atrial fibrillation with premature ventricular or aberrantly conducted complexes Left axis deviation Low voltage QRS Inferior infarct , age undetermined Cannot rule out Anterior infarct , age undetermined Abnormal ECG When compared with ECG of 03-Aug-2023 08:23, PREVIOUS ECG IS PRESENT when compared to prior, similar appearance with pvc No STEMI Confirmed by Ginger Barefoot ([id number]) on 08/08/2023 1:56:01 PM   Patient had progressively worsening shortness of breath despite an increased dose of Lasix  and feeling generally weak.  Patient is alert.  Mental status clear.  No respiratory distress at rest.  Some crackles in the left lung field.  Grossly clear on the right.  Abdomen soft without guarding.  Lower extremity 1+ edema.  Agree with plan of management.   Armenta Canning, MD 08/08/23 [phone]

## 2023-08-08 NOTE — ED Provider Notes (Signed)
 [place] [facility] DEPARTMENT AT [facility] Provider Note   CSN: [id number] Arrival date & time: 08/08/23  1337     Patient presents with: Weakness   Stephanie Foster is a 89 y.o. female the past medical history of chronic kidney disease, persistent atrial fibrillation not on anticoagulation due to history of severe GI bleed, history of CVA, hiatal hernia, hypertension who presents to the emergency department for generalized weakness and shortness of breath.  Patient was seen 5 days ago for shortness of breath.  She had associated peripheral edema.  She was treated with oral diuretic tablets and went home.  She reports that her legs decreased in size but her shortness of breath did not significantly diminish.  She also reports that she did not urinate a lot.  Patient is here with her family member who states that she was too weak to get out of bed today.  She continues to feel shortness of breath that way at rest which is far worse with any form of exertion.  She also complains of tingling all over her body.  She states I feel like I can't even left my head up off this bed.  Her family member states that she called him today complaining of weakness stating I think I may be near the end.  {Add pertinent medical, surgical, social history, OB history to HPI:32947}  Weakness      Prior to Admission medications   Medication Sig Start Date End Date Taking? Authorizing Provider  atorvastatin  (LIPITOR) 20 MG tablet Take 20 mg by mouth daily.    [provider]  Cranberry-Vitamin C-Vitamin E 4200-20-3 MG-MG-UNIT CAPS Take 1 tablet by mouth daily.    [provider]  diltiazem  (CARDIZEM  CD) 120 MG 24 hr capsule TAKE 1 CAPSULE(120 MG) BY MOUTH TWICE DAILY 02/24/23   Krasowski, Robert J, MD  furosemide  (LASIX ) 20 MG tablet Take 1 tablet (20 mg total) by mouth daily for 2 days. 08/04/23 08/06/23  Long, Fonda MATSU, MD  iron  polysaccharides (NIFEREX) 150 MG capsule Take 150 mg  by mouth every other day.    [provider]  levothyroxine  (SYNTHROID ) 25 MCG tablet Take 0.5 tablets (12.5 mcg total) by mouth daily before breakfast. 01/09/23 04/09/23  Briana Elgin LABOR, MD  pantoprazole  (PROTONIX ) 40 MG tablet Take 1 tablet (40 mg total) by mouth daily. 01/09/23 04/09/23  Briana Elgin LABOR, MD  pantoprazole  (PROTONIX ) 40 MG tablet Take by mouth. 01/12/23 01/12/24  [provider]  Vitamin D, Ergocalciferol, (DRISDOL) 1.25 MG (50000 UNIT) CAPS capsule Take 50,000 Units by mouth once a week. 01/03/23   [provider]    Allergies: Ciprofloxacin, Dextromethorphan-guaifenesin, Aspartame and phenylalanine, Codeine, Diovan [valsartan], Flagyl [metronidazole], Macrobid [nitrofurantoin macrocrystal], Phenylalanine, Prednisone, Sulfasalazine, Vitasana [actical], Aspirin , Doxycycline, and Sulfa antibiotics    Review of Systems  Neurological:  Positive for weakness.    Updated Vital Signs BP (!) 117/54 (BP Location: Left Arm)   Pulse (!) 122   Temp 98.2 F (36.8 C)   Resp 20   Ht 5' (1.524 m)   Wt 65.8 kg   SpO2 96%   BMI 28.32 kg/m   Physical Exam Vitals and nursing note reviewed.  Constitutional:      General: She is not in acute distress.    Appearance: She is well-developed. She is not diaphoretic.     Comments: Appears weak, able to speak in full sentences but breathing is labored  HENT:     Head: Normocephalic  and atraumatic.     Right Ear: External ear normal.     Left Ear: External ear normal.     Nose: Nose normal.     Mouth/Throat:     Mouth: Mucous membranes are moist.   Eyes:     General: No scleral icterus.    Conjunctiva/sclera: Conjunctivae normal.    Cardiovascular:     Rate and Rhythm: Normal rate and regular rhythm.     Heart sounds: Normal heart sounds. No murmur heard.    No friction rub. No gallop.  Pulmonary:     Effort: Pulmonary effort is normal. Tachypnea present. No prolonged expiration or respiratory  distress.     Breath sounds: Decreased air movement present. Examination of the right-middle field reveals decreased breath sounds. Examination of the left-middle field reveals decreased breath sounds. Examination of the right-lower field reveals rales. Examination of the left-lower field reveals rales. Decreased breath sounds and rales present.  Abdominal:     General: Bowel sounds are normal. There is no distension.     Palpations: Abdomen is soft. There is no mass.     Tenderness: There is no abdominal tenderness. There is no guarding.   Musculoskeletal:     Cervical back: Normal range of motion.     Right lower leg: Edema present.     Left lower leg: Edema present.   Skin:    General: Skin is warm and dry.   Neurological:     Mental Status: She is alert and oriented to person, place, and time.   Psychiatric:        Behavior: Behavior normal.     (all labs ordered are listed, but only abnormal results are displayed) Labs Reviewed  CBG MONITORING, ED - Abnormal; Notable for the following components:      Result Value   Glucose-Capillary 157 (*)    All other components within normal limits  COMPREHENSIVE METABOLIC PANEL WITH GFR  CBC  URINALYSIS, ROUTINE W REFLEX MICROSCOPIC    EKG: EKG Interpretation Date/Time:  Monday August 08 2023 13:53:46 EDT Ventricular Rate:  94 PR Interval:    QRS Duration:  76 QT Interval:  310 QTC Calculation: 387 R Axis:   -38  Text Interpretation: Atrial fibrillation with premature ventricular or aberrantly conducted complexes Left axis deviation Low voltage QRS Inferior infarct , age undetermined Cannot rule out Anterior infarct , age undetermined Abnormal ECG When compared with ECG of 03-Aug-2023 08:23, PREVIOUS ECG IS PRESENT when compared to prior, similar appearance with pvc No STEMI Confirmed by Ginger Barefoot ([id number]) on 08/08/2023 1:56:01 PM  Radiology: No results found.  {Document cardiac monitor, telemetry assessment procedure when  appropriate:32947} Procedures   Medications Ordered in the ED - No data to display  Clinical Course as of 08/08/23 1708  Mon Aug 08, 2023  1537 I-Stat venous blood gas, ([facility], [facility], [facility])(!) Vbg shows a respiratory alkalosis [AH]  1602 Patient has requested a bed at [facility]. I have the secretary calling for possible admission [AH]  1701 Currently in a queue to even be considered for call [facility] at [facility]- no time frame to recive a call per the PAL line. I discussed this with the patient and her son. Have agreed to let me call Cyril for admission  [AH]    Clinical Course User Index [AH] Naydene Kamrowski, PA-C   {Click here for ABCD2, HEART and other calculators REFRESH Note before signing:1}  Medical Decision Making This patient presents to the ED with chief complaint(s) of generalized weakness, tingling, shortness of breath with pertinent past medical history of atrial fibrillation without anticoagulation and chronic kidney disease and hypothyroidism which further complicates the presenting complaint. The complaint involves an extensive differential diagnosis and treatment options and also carries with it a high risk of complications and morbidity.    Differential diagnosis includes but is not limited to pulmonary edema, CHF exacerbation, ACS, acute on chronic kidney injury, hypokalemia and other electrolyte disturbance, hyperventilation, ascites, anasarca, infection, hemorrhage, myxedema.  The initial plan is to order CBC, CMP, BNP, VBG, TSH, troponin, chest x-ray  Additional Tests and treatment considered:  I have low suspicion for acute pulmonary embolus.  Additional history obtained: Additional history obtained from family Records reviewed previous admission documents  Reassessment and review (also see workup area): Lab Tests: I Ordered, and personally interpreted labs.  The pertinent results include:    As per ED course    Imaging Studies: I ordered and independently visualized and interpreted the following imaging X-ray chest   which showed pulmonary vascular congestion and CP angle blunting consistent with edema The interpretation of the imaging was limited to assessing for emergent pathology, for which purpose it was ordered.  Consultations Obtained: I requested consultation with the {consultation:26851}, and discussed  findings as well as pertinent plan - they recommend: ***  Medicines ordered and prescription drug management: I ordered the following medications lasix  for diuresis       Cardiac Monitoring: The patient was maintained on a cardiac monitor.  I personally viewed and interpreted the cardiac monitor which showed an underlying rhythm of: Rate controlled atrial fibrillation  Complexity of problems addressed: Patient's presentation is most consistent with  severe exacerbation of chronic illness     During patient's assessment  Disposition: After consideration of the diagnostic results and the patient's response to treatment,  I feel that the patent would benefit from admission .    Patient here with sob. Pulmonary edema. I suspect that her tingling and numbness are a result of her respiratory alkalosis.  Patient has kyphosis, pulmonary edema, hiatal hernia all of which I think are creating some condition of restrictive air movement which is likely causing her respiratory alkalosis.  She is not actively hyperventilating.   Amount and/or Complexity of Data Reviewed Labs: ordered. Decision-making details documented in ED Course. Radiology: ordered.  Risk Prescription drug management. Decision regarding hospitalization.     {Document critical care time when appropriate  Document review of labs and clinical decision tools ie CHADS2VASC2, etc  Document your independent review of radiology images and any outside records  Document your discussion with family members, caretakers and  with consultants  Document social determinants of health affecting pt's care  Document your decision making why or why not admission, treatments were needed:32947:::1}   Final diagnoses:  None    ED Discharge Orders     None

## 2023-08-08 NOTE — H&P (Signed)
 History and Physical    TIANNI ESCAMILLA FMW:[id number] DOB: [birth year] DOA: 08/08/2023  PCP: Duwayne Katheryn HERO, PA   Chief Complaint:  chf exacerbation  HPI: Stephanie Foster is a 89 y.o. female with medical history significant of GERD, prior stroke, hypertension, CHF who presented to the emergency department due to shortness of breath and weakness.  Patient's developed symptoms approximately 5 days ago.  She was given Lasix  in outpatient setting but had persistent symptoms.  Her family brought her to the ER because she was unable to get a bed due to her worsening weakness and shortness of breath.  On arrival she was afebrile hemodynamically stable.  Labs were obtained on presentation which showed creatinine 0.93, WBC 4.2, hemoglobin 9.8, platelets 217, urinalysis negative for infection, troponin 17, BNP 512, TSH 3.9.  Patient underwent chest x-ray which showed pulm vascular congestion.  Patient was given 20 mg of IV Lasix  admitted further workup.  On admission she had peripheral edema and JVD.  She was redosed her IV diuretic.    Review of Systems: Review of Systems  Constitutional: Negative.   HENT: Negative.    Eyes: Negative.   Respiratory:  Positive for shortness of breath.   Cardiovascular:  Positive for leg swelling and PND.  Gastrointestinal: Negative.   Genitourinary: Negative.   Musculoskeletal: Negative.   Skin: Negative.   Neurological: Negative.   Psychiatric/Behavioral: Negative.    All other systems reviewed and are negative.    As per HPI otherwise 10 point review of systems negative.   Allergies  Allergen Reactions   Ciprofloxacin Hives and Itching   Dextromethorphan-Guaifenesin Other (See Comments)    Muscle Weakness   Aspartame And Phenylalanine Other (See Comments)    heart felt like swelling, top of head felt like blood pressure coming out of top   Codeine Nausea Only   Diovan [Valsartan] Other (See Comments)    makes me weak   Flagyl [Metronidazole] Diarrhea and  Nausea And Vomiting   Macrobid [Nitrofurantoin Macrocrystal] Nausea Only   Phenylalanine Other (See Comments)    heart felt like swelling, top of head felt like blood pressure coming out of top   Prednisone Other (See Comments)    makes me feel so weak that I can't walk across the floor   Sulfasalazine Nausea Only   Vitasana [Actical] Other (See Comments)    tired, arms and legs felt like going down   Aspirin  Palpitations   Doxycycline Other (See Comments)   Sulfa Antibiotics Nausea Only    Past Medical History:  Diagnosis Date   Cataract    Chest pain 03/24/2012   Diaphoresis 03/24/2012   Diverticulosis    Dry eye syndrome of both lacrimal glands 11/16/2017   Dyslipidemia 02/28/2020   Dyspnea on exertion 02/28/2020   Essential hypertension 02/28/2020   GERD (gastroesophageal reflux disease)    H/O hiatal hernia    Hernia    Hypertension    Late effect of cerebrovascular accident (CVA) 02/28/2020   Nausea 03/24/2012   Nonexudative age-related macular degeneration, bilateral, early dry stage 11/16/2017   Pseudophakia of both eyes 11/16/2017   Stroke (HCC) 02/22/2020    Past Surgical History:  Procedure Laterality Date   ABDOMINAL HYSTERECTOMY     BLADDER SUSPENSION     EYE SURGERY     TONSILLECTOMY       reports that she has never smoked. She has never used smokeless tobacco. She reports that she does not drink alcohol and does not use  drugs.  Family History  Problem Relation Age of Onset   Coronary artery disease Father    Coronary artery disease Brother     Prior to Admission medications   Medication Sig Start Date End Date Taking? Authorizing Provider  atorvastatin  (LIPITOR) 20 MG tablet Take 20 mg by mouth daily.    [provider]  Cranberry-Vitamin C-Vitamin E 4200-20-3 MG-MG-UNIT CAPS Take 1 tablet by mouth daily.    [provider]  diltiazem  (CARDIZEM  CD) 120 MG 24 hr capsule TAKE 1 CAPSULE(120 MG) BY MOUTH TWICE DAILY 02/24/23   Krasowski, Kathyann Spaugh  J, MD  furosemide  (LASIX ) 20 MG tablet Take 1 tablet (20 mg total) by mouth daily for 2 days. 08/04/23 08/06/23  Long, Fonda MATSU, MD  iron  polysaccharides (NIFEREX) 150 MG capsule Take 150 mg by mouth every other day.    [provider]  levothyroxine  (SYNTHROID ) 25 MCG tablet Take 0.5 tablets (12.5 mcg total) by mouth daily before breakfast. 01/09/23 04/09/23  Briana Elgin LABOR, MD  pantoprazole  (PROTONIX ) 40 MG tablet Take 1 tablet (40 mg total) by mouth daily. 01/09/23 04/09/23  Briana Elgin LABOR, MD  pantoprazole  (PROTONIX ) 40 MG tablet Take by mouth. 01/12/23 01/12/24  [provider]  Vitamin D, Ergocalciferol, (DRISDOL) 1.25 MG (50000 UNIT) CAPS capsule Take 50,000 Units by mouth once a week. 01/03/23   [provider]    Physical Exam: Vitals:   08/08/23 1430 08/08/23 1500 08/08/23 1600 08/08/23 1850  BP: 121/64 109/72 (!) 116/55 112/76  Pulse: 98 (!) 44 90 94  Resp:  17 19 18   Temp:    98.1 F (36.7 C)  TempSrc:    Oral  SpO2: 95% 92% 96% 96%  Weight:      Height:       Physical Exam Constitutional:      Appearance: She is normal weight.  HENT:     Head: Normocephalic.     Nose: Nose normal.     Mouth/Throat:     Mouth: Mucous membranes are moist.     Pharynx: Oropharynx is clear.   Eyes:     Pupils: Pupils are equal, round, and reactive to light.    Cardiovascular:     Rate and Rhythm: Normal rate and regular rhythm.     Pulses: Normal pulses.  Pulmonary:     Effort: Respiratory distress present.  Abdominal:     General: Abdomen is flat.   Musculoskeletal:        General: Swelling present.     Cervical back: Normal range of motion.   Skin:    General: Skin is warm.     Capillary Refill: Capillary refill takes less than 2 seconds.   Neurological:     General: No focal deficit present.     Mental Status: She is alert.   Psychiatric:        Mood and Affect: Mood normal.       Labs on Admission: I have personally reviewed the  patients's labs and imaging studies.  Assessment/Plan Principal Problem:   CHF (congestive heart failure) (HCC) Active Problems:   CHF exacerbation (HCC)   # Shortness of breath most likely secondary to acute on chronic heart failure exacerbation - Patient had echocardiogram 2022 which demonstrated EF 55%. - Given IV Lasix   Plan: Redose diuretic Obtain echocardiogram Hold off starting goal-directed medical therapy pending echocardiogram results Hold diltiazem   # Hypothyroidism-continue levothyroxine   # GERD-continue Protonix   # Paroxysmal A-fib-patient is currently not on anticoagulation however  takes diltiazem  metoprolol.  Will hold beta-blockade agents in setting of possible heart failure exacerbation  # Chronic anemia-trend hemoglobin    Admission status: Inpatient Telemetry  Certification: The appropriate patient status for this patient is INPATIENT. Inpatient status is judged to be reasonable and necessary in order to provide the required intensity of service to ensure the patient's safety. The patient's presenting symptoms, physical exam findings, and initial radiographic and laboratory data in the context of their chronic comorbidities is felt to place them at high risk for further clinical deterioration. Furthermore, it is not anticipated that the patient will be medically stable for discharge from the hospital within 2 midnights of admission.   * I certify that at the point of admission it is my clinical judgment that the patient will require inpatient hospital care spanning beyond 2 midnights from the point of admission due to high intensity of service, high risk for further deterioration and high frequency of surveillance required.DEWAINE Lamar Dess MD [facility] If 7PM-7AM, please contact night-coverage [email or web address]  08/08/2023, 6:56 PM

## 2023-08-09 ENCOUNTER — Encounter: Payer: Self-pay | Admitting: Cardiology

## 2023-08-09 ENCOUNTER — Inpatient Hospital Stay (HOSPITAL_COMMUNITY)

## 2023-08-09 DIAGNOSIS — E876 Hypokalemia: Secondary | ICD-10-CM

## 2023-08-09 DIAGNOSIS — I130 Hypertensive heart and chronic kidney disease with heart failure and stage 1 through stage 4 chronic kidney disease, or unspecified chronic kidney disease: Secondary | ICD-10-CM | POA: Diagnosis not present

## 2023-08-09 DIAGNOSIS — I480 Paroxysmal atrial fibrillation: Secondary | ICD-10-CM | POA: Diagnosis not present

## 2023-08-09 DIAGNOSIS — R531 Weakness: Secondary | ICD-10-CM

## 2023-08-09 DIAGNOSIS — I5033 Acute on chronic diastolic (congestive) heart failure: Secondary | ICD-10-CM | POA: Diagnosis not present

## 2023-08-09 LAB — CBC
HCT: 37.3 % (ref 36.0–46.0)
Hemoglobin: 11 g/dL — ABNORMAL LOW (ref 12.0–15.0)
MCH: 20.1 pg — ABNORMAL LOW (ref 26.0–34.0)
MCHC: 29.5 g/dL — ABNORMAL LOW (ref 30.0–36.0)
MCV: 68.1 fL — ABNORMAL LOW (ref 80.0–100.0)
Platelets: 213 10*3/uL (ref 150–400)
RBC: 5.48 MIL/uL — ABNORMAL HIGH (ref 3.87–5.11)
RDW: 22.6 % — ABNORMAL HIGH (ref 11.5–15.5)
WBC: 5.3 10*3/uL (ref 4.0–10.5)
nRBC: 0 % (ref 0.0–0.2)

## 2023-08-09 LAB — BASIC METABOLIC PANEL WITH GFR
Anion gap: 12 (ref 5–15)
BUN: 22 mg/dL (ref 8–23)
CO2: 28 mmol/L (ref 22–32)
Calcium: 8.9 mg/dL (ref 8.9–10.3)
Chloride: 100 mmol/L (ref 98–111)
Creatinine, Ser: 0.88 mg/dL (ref 0.44–1.00)
GFR, Estimated: >60 mL/min (ref >60)
Glucose, Bld: 101 mg/dL — ABNORMAL HIGH (ref 70–99)
Potassium: 3.3 mmol/L — ABNORMAL LOW (ref 3.5–5.1)
Sodium: 140 mmol/L (ref 135–145)

## 2023-08-09 LAB — ECHOCARDIOGRAM COMPLETE
Calc EF: 55.9 %
Height: 60 in
S' Lateral: 2.3 cm
Single Plane A2C EF: 54.4 %
Single Plane A4C EF: 55.4 %
Weight: 2320 [oz_av]

## 2023-08-09 LAB — MAGNESIUM: Magnesium: 1.9 mg/dL (ref 1.7–2.4)

## 2023-08-09 MED ORDER — PERFLUTREN LIPID MICROSPHERE
1.0000 mL | INTRAVENOUS | Status: AC | PRN
  Administered 2023-08-09: 1 mL via INTRAVENOUS

## 2023-08-09 MED ORDER — POLYETHYLENE GLYCOL 3350 17 G PO PACK
17.0000 g | PACK | Freq: Every day | ORAL | Status: DC
  Administered 2023-08-09 – 2023-08-10 (×2): 17 g via ORAL
  Filled 2023-08-09 (×2): qty 1

## 2023-08-09 MED ORDER — DILTIAZEM HCL ER COATED BEADS 120 MG PO CP24
120.0000 mg | ORAL_CAPSULE | Freq: Every day | ORAL | Status: DC
  Administered 2023-08-09 – 2023-08-10 (×2): 120 mg via ORAL
  Filled 2023-08-09 (×2): qty 1

## 2023-08-09 MED ORDER — DILTIAZEM HCL ER COATED BEADS 120 MG PO CP24: 120.0000 mg | ORAL_CAPSULE | Freq: Every day | ORAL | Status: DC

## 2023-08-09 MED ORDER — FUROSEMIDE 20 MG PO TABS
20.0000 mg | ORAL_TABLET | Freq: Every day | ORAL | Status: DC
  Administered 2023-08-10: 20 mg via ORAL
  Filled 2023-08-09: qty 1

## 2023-08-09 MED ORDER — ENOXAPARIN SODIUM 40 MG/0.4ML IJ SOSY
40.0000 mg | PREFILLED_SYRINGE | INTRAMUSCULAR | Status: DC
  Administered 2023-08-09: 40 mg via SUBCUTANEOUS
  Filled 2023-08-09: qty 0.4

## 2023-08-09 MED ORDER — POTASSIUM CHLORIDE 20 MEQ PO PACK
40.0000 meq | PACK | Freq: Once | ORAL | Status: AC
  Administered 2023-08-09: 40 meq via ORAL
  Filled 2023-08-09: qty 2

## 2023-08-09 MED ORDER — ATORVASTATIN CALCIUM 20 MG PO TABS
20.0000 mg | ORAL_TABLET | Freq: Every day | ORAL | Status: DC
  Administered 2023-08-09 – 2023-08-10 (×2): 20 mg via ORAL
  Filled 2023-08-09 (×2): qty 1

## 2023-08-09 NOTE — TOC Initial Note (Signed)
 Transition of Care [facility]) - Initial/Assessment Note   Patient Details  Name: Stephanie Foster MRN: [id number] Date of Birth: [birth year]  Transition of [facility]) CM/SW Contact:    [name], LCSW Phone Number: 08/09/2023, 11:51 AM  Clinical Narrative: Patient is from home with family. TOC following for possible discharge needs.  Expected Discharge Plan:  (TBD) Barriers to Discharge: Continued Medical Work up  Expected Discharge Plan and Services In-house Referral: Clinical Social Work Living arrangements for the past 2 months: Single Family Home  Prior Living Arrangements/Services Living arrangements for the past 2 months: Single Family Home Lives with:: Adult Children Patient language and need for interpreter reviewed:: Yes Do you feel safe going back to the place where you live?: Yes      Need for Family Participation in Patient Care: Yes (Comment) Care giver support system in place?: Yes (comment) Criminal Activity/Legal Involvement Pertinent to Current Situation/Hospitalization: No - Comment as needed  Activities of Daily Living ADL Screening (condition at time of admission) Independently performs ADLs?: Yes (appropriate for developmental age) Is the patient deaf or have difficulty hearing?: Yes Does the patient have difficulty seeing, even when wearing glasses/contacts?: Yes Does the patient have difficulty concentrating, remembering, or making decisions?: No  Emotional Assessment Orientation: : Oriented to Self, Oriented to Place, Oriented to  Time, Oriented to Situation Alcohol / Substance Use: Not Applicable Psych Involvement: No (comment)  Admission diagnosis:  Respiratory alkalosis [E87.3] CHF (congestive heart failure) (HCC) [I50.9] Acute pulmonary edema (HCC) [J81.0] Generalized weakness [R53.1] CHF exacerbation (HCC) [I50.9] Patient Active Problem List   Diagnosis Date Noted   CHF (congestive heart failure) (HCC) 08/08/2023   CHF exacerbation (HCC)  08/08/2023   Acute GI bleeding 01/07/2023   Stage 3 chronic kidney disease (HCC) 10/07/2021   Persistent atrial fibrillation (HCC) 10/01/2021   H/O hiatal hernia    Cataract    Primary osteoarthritis of left knee 06/11/2020   Hiatal hernia with gastroesophageal reflux disease without esophagitis 04/17/2020   Paroxysmal atrial fibrillation (HCC) 04/07/2020   Hypertension    GERD (gastroesophageal reflux disease)    Diverticulosis    Late effect of cerebrovascular accident (CVA) 02/28/2020   Dyslipidemia 02/28/2020   Dyspnea on exertion 02/28/2020   Essential hypertension 02/28/2020   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 02/28/2020   Stroke (HCC) 02/22/2020   Pseudophakia of both eyes 11/16/2017   Dry eye syndrome of both lacrimal glands 11/16/2017   Nonexudative age-related macular degeneration, bilateral, early dry stage 11/16/2017   Chest pain 03/24/2012   Nausea 03/24/2012   Diaphoresis 03/24/2012   PCP:  Duwayne Katheryn HERO, PA Pharmacy:   [facility] [phone] - [place], [place] - [address] AT [facility] [address] [place] [address] Phone: [phone] Fax: [phone]  Social Drivers of Health (SDOH) Social History: SDOH Screenings   Food Insecurity: No Food Insecurity (08/08/2023)  Housing: Low Risk  (08/08/2023)  Transportation Needs: No Transportation Needs (08/08/2023)  Utilities: Not At Risk (08/08/2023)  Social Connections: Moderately Isolated (08/08/2023)  Tobacco Use: Low Risk  (08/08/2023)   SDOH Interventions:    Readmission Risk Interventions     No data to display

## 2023-08-09 NOTE — Plan of Care (Signed)

## 2023-08-09 NOTE — Care Management CC44 (Signed)
 Condition Code 44 Documentation Completed  Patient Details  Name: SHANIQUE ASLINGER MRN: [id number] Date of Birth: Jan 12, 1925   Condition Code 44 given:  Yes Patient signature on Condition Code 44 notice:  Yes Documentation of 2 MD's agreement:  Yes Code 44 added to claim:  Yes    [name], LCSW 08/09/2023, 3:41 PM

## 2023-08-09 NOTE — Care Management Obs Status (Signed)
 MEDICARE OBSERVATION STATUS NOTIFICATION   Patient Details  Name: Stephanie Foster MRN: [id number] Date of Birth: 1924-12-25   Medicare Observation Status Notification Given:  Yes    [name], LCSW 08/09/2023, 3:41 PM

## 2023-08-09 NOTE — Progress Notes (Signed)
 Patient heart rate spiked up to 130s, afib, while ambulating with PT. Patient fatigued.

## 2023-08-09 NOTE — Progress Notes (Signed)
 Progress Note   Patient: Stephanie Foster FMW:[id number] DOB: 1924-11-05 DOA: 08/08/2023     1 DOS: the patient was seen and examined on 08/09/2023   Brief hospital course: LAMYA LAUSCH is a 89 y.o. female with medical history significant of GERD, prior stroke, hypertension, CHF who presented to the emergency department due to shortness of breath and weakness.  She is admitted for further management and evaluation of CHF exacerbation.  Assessment and Plan: Acute on chronic diastolic CHF: Will continue gentle IV diuresis. Her symptoms improved, no sob, chest pain. Echo pending. Will advise her to resume low dose lasix  at home.  Hypokalemia- Potassium 3.3 due to diuresis. Will give oral potassium supplements. Monitor daily electrolytes.  Hypothyroidism-continue levothyroxine    GERD- continue Protonix    Paroxysmal A-fib- She is not on anticoagulation. Resume Diltiazem  120mg  daily.   Chronic anemia- hemoglobin stable. No active bleeding.      Out of bed to chair. Incentive spirometry. Nursing supportive care. Fall, aspiration precautions. Diet:  Diet Orders (From admission, onward)     Start     Ordered   08/08/23 1851  Diet 2 gram sodium Room service appropriate? Yes; Fluid consistency: Thin  Diet effective now       Question Answer Comment  Room service appropriate? Yes   Fluid consistency: Thin      08/08/23 1850           DVT prophylaxis: enoxaparin  (LOVENOX ) injection 40 mg Start: 08/09/23 2000 SCDs Start: 08/08/23 1850  Level of care: Telemetry   Code Status: Full Code  Subjective: Patient is seen and examined today morning. She feels better today. Denies shortness of breath. Did not get out of bed.   Physical Exam: Vitals:   08/08/23 2329 08/09/23 0221 08/09/23 0706 08/09/23 1223  BP: 124/70 118/72 111/65 113/72  Pulse: 93 100 (!) 59 (!) 106  Resp:    18  Temp: 97.7 F (36.5 C) 98.1 F (36.7 C) (!) 97.5 F (36.4 C) (!) 97.5 F (36.4 C)  TempSrc:  Oral Oral Oral Oral  SpO2: 95% 96% 94% 97%  Weight:      Height:        General - Elderly Caucasian female, no apparent distress HEENT - PERRLA, EOMI, atraumatic head, non tender sinuses. Lung - Clear, basal rales, rhonchi, wheezes. Heart - S1, S2 heard, no murmurs, rubs, trace pedal edema. Abdomen - Soft, non tender, bowel sounds good Neuro - Alert, awake and oriented, non focal exam. Skin - Warm and dry.  Data Reviewed:      Latest Ref Rng & Units 08/09/2023    3:49 AM 08/08/2023    2:45 PM 08/08/2023    1:53 PM  CBC  WBC 4.0 - 10.5 K/uL 5.3   4.2   Hemoglobin 12.0 - 15.0 g/dL 88.9  89.0  9.8   Hematocrit 36.0 - 46.0 % 37.3  32.0  33.8   Platelets 150 - 400 K/uL 213   217       Latest Ref Rng & Units 08/09/2023    3:49 AM 08/08/2023    2:45 PM 08/08/2023    1:53 PM  BMP  Glucose 70 - 99 mg/dL 898   856   BUN 8 - 23 mg/dL 22   20   Creatinine 9.55 - 1.00 mg/dL 9.11   9.06   Sodium 864 - 145 mmol/L 140  142  142   Potassium 3.5 - 5.1 mmol/L 3.3  3.9  3.8   Chloride  98 - 111 mmol/L 100   105   CO2 22 - 32 mmol/L 28   25   Calcium  8.9 - 10.3 mg/dL 8.9   9.0    ECHOCARDIOGRAM COMPLETE Result Date: 08/09/2023    ECHOCARDIOGRAM REPORT   Patient Name:   Stephanie Foster Date of Exam: 08/09/2023 Medical Rec #:  [id number]    Height:       60.0 in Accession #:    [id number]   Weight:       145.0 lb Date of Birth:  [birth year]    BSA:          1.628 m Patient Age:    89 years     BP:           111/65 mmHg Patient Gender: F            HR:           119 bpm. Exam Location:  Inpatient Procedure: 2D Echo, Cardiac Doppler, Color Doppler and Intracardiac            Opacification Agent (Both Spectral and Color Flow Doppler were            utilized during procedure). Indications:    CHF  History:        Patient has prior history of Echocardiogram examinations. CHF;                 Risk Factors:Hypertension.  Sonographer:    Vella Key Referring Phys: [id number] ROBERT DORRELL IMPRESSIONS  1. Left  ventricular ejection fraction, by estimation, is 55 to 60%. The left ventricle has normal function. The left ventricle has no regional wall motion abnormalities. There is mild concentric left ventricular hypertrophy. Left ventricular diastolic parameters are indeterminate.  2. Right ventricular systolic function is normal. The right ventricular size is normal.  3. Left atrial size was moderately dilated.  4. The mitral valve is grossly normal. Trivial mitral valve regurgitation. No evidence of mitral stenosis.  5. The aortic valve is tricuspid. Aortic valve regurgitation is not visualized. No aortic stenosis is present. Comparison(s): No significant change from prior study. Prior images reviewed side by side. FINDINGS  Left Ventricle: Left ventricular ejection fraction, by estimation, is 55 to 60%. The left ventricle has normal function. The left ventricle has no regional wall motion abnormalities. The left ventricular internal cavity size was small. There is mild concentric left ventricular hypertrophy. Left ventricular diastolic parameters are indeterminate. Right Ventricle: The right ventricular size is normal. No increase in right ventricular wall thickness. Right ventricular systolic function is normal. Left Atrium: Left atrial size was moderately dilated. Right Atrium: Right atrial size was normal in size. Pericardium: There is no evidence of pericardial effusion. Mitral Valve: The mitral valve is grossly normal. Trivial mitral valve regurgitation. No evidence of mitral valve stenosis. Tricuspid Valve: The tricuspid valve is normal in structure. Tricuspid valve regurgitation is not demonstrated. No evidence of tricuspid stenosis. Aortic Valve: The aortic valve is tricuspid. Aortic valve regurgitation is not visualized. No aortic stenosis is present. Pulmonic Valve: The pulmonic valve was not well visualized. Pulmonic valve regurgitation is not visualized. No evidence of pulmonic stenosis. Aorta: The aortic  root and ascending aorta are structurally normal, with no evidence of dilitation. IAS/Shunts: The atrial septum is grossly normal.  LEFT VENTRICLE PLAX 2D LVIDd:         3.20 cm LVIDs:         2.30 cm LV PW:  1.20 cm LV IVS:        1.20 cm LVOT diam:     1.70 cm LV SV:         16 LV SV Index:   10 LVOT Area:     2.27 cm  LV Volumes (MOD) LV vol d, MOD A2C: 33.6 ml LV vol d, MOD A4C: 52.6 ml LV vol s, MOD A2C: 15.3 ml LV vol s, MOD A4C: 23.5 ml LV SV MOD A2C:     18.3 ml LV SV MOD A4C:     52.6 ml LV SV MOD BP:      24.0 ml RIGHT VENTRICLE RV Basal diam:  2.60 cm RV S prime:     11.80 cm/s TAPSE (M-mode): 1.5 cm LEFT ATRIUM             Index        RIGHT ATRIUM           Index LA diam:        3.00 cm 1.84 cm/m   RA Area:     17.30 cm LA Vol (A2C):   80.1 ml 49.20 ml/m  RA Volume:   37.30 ml  22.91 ml/m LA Vol (A4C):   64.0 ml 39.31 ml/m LA Biplane Vol: 74.3 ml 45.63 ml/m  AORTIC VALVE LVOT Vmax:   54.80 cm/s LVOT Vmean:  38.600 cm/s LVOT VTI:    0.069 m  AORTA Ao Root diam: 3.50 cm Ao Asc diam:  3.50 cm TRICUSPID VALVE TV Peak grad:   23.4 mmHg TV Vmax:        2.42 m/s TR Peak grad:   23.4 mmHg TR Vmax:        242.00 cm/s  SHUNTS Systemic VTI:  0.07 m Systemic Diam: 1.70 cm Stanly Leavens MD Electronically signed by Stanly Leavens MD Signature Date/Time: 08/09/2023/11:26:24 AM    Final    DG Chest Port 1 View Result Date: 08/08/2023 CLINICAL DATA:  Shortness of breath. EXAM: PORTABLE CHEST 1 VIEW COMPARISON:  08/03/2023. FINDINGS: Low lung volumes. Cardiomediastinal silhouette is unchanged. Large hiatal hernia. Aortic atherosclerosis. Persistent central pulmonary vascular congestion with mild bilateral interstitial prominence, which could reflect bronchovascular crowding or interstitial edema. Similar blunting of the left costophrenic angle with left basilar opacity favored to reflect atelectasis. Visualized osseous structures are unchanged. IMPRESSION: 1. Low lung volumes. Persistent  central pulmonary vascular congestion with mild bilateral interstitial prominence, which could reflect bronchovascular crowding or interstitial edema. 2. Similar blunting of the left costophrenic angle with left basilar opacity favored to reflect atelectasis. 3. Large hiatal hernia. Electronically Signed   By: Harrietta Sherry M.D.   On: 08/08/2023 15:15    Family Communication: Discussed with patient, she understand and agree. All questions answered.  Disposition: Status is: Inpatient Remains inpatient appropriate because: diuresis, monitor electrolytes.  Planned Discharge Destination: Home with Home Health     Time spent: 37 minutes  Author: Concepcion Riser, MD 08/09/2023 1:37 PM Secure chat 7am to 7pm For on call review [email or web address].

## 2023-08-09 NOTE — Progress Notes (Signed)
 Mobility Specialist - Progress Note  During mobility: 150 bpm HR Post-mobility: 114 bpm HR   08/09/23 1520  Mobility  Activity Ambulated with assistance in hallway  Level of Assistance Contact guard assist, steadying assist  Assistive Device Front wheel walker  Distance Ambulated (ft) 90 ft  Range of Motion/Exercises Active  Activity Response Tolerated fair  Mobility Referral Yes  Mobility visit 1 Mobility  Mobility Specialist Start Time (ACUTE ONLY) 1500  Mobility Specialist Stop Time (ACUTE ONLY) 1520  Mobility Specialist Time Calculation (min) (ACUTE ONLY) 20 min   Pt was found in bed and agreeable to ambulate. Grew fatigued with session and c/o poping from R knee. Took x1 standing rest break due to increased HR. At EOS returned to recliner chair with all needs met. Call bell in reach and RN notified of session.   Erminio Leos,  Mobility Specialist Can be reached via Secure Chat

## 2023-08-10 ENCOUNTER — Other Ambulatory Visit (HOSPITAL_COMMUNITY): Payer: Self-pay

## 2023-08-10 DIAGNOSIS — R110 Nausea: Secondary | ICD-10-CM | POA: Diagnosis not present

## 2023-08-10 DIAGNOSIS — H353131 Nonexudative age-related macular degeneration, bilateral, early dry stage: Secondary | ICD-10-CM | POA: Diagnosis not present

## 2023-08-10 DIAGNOSIS — I10 Essential (primary) hypertension: Secondary | ICD-10-CM | POA: Diagnosis not present

## 2023-08-10 DIAGNOSIS — I5033 Acute on chronic diastolic (congestive) heart failure: Secondary | ICD-10-CM | POA: Diagnosis not present

## 2023-08-10 DIAGNOSIS — N1831 Chronic kidney disease, stage 3a: Secondary | ICD-10-CM

## 2023-08-10 DIAGNOSIS — I4819 Other persistent atrial fibrillation: Secondary | ICD-10-CM

## 2023-08-10 DIAGNOSIS — I130 Hypertensive heart and chronic kidney disease with heart failure and stage 1 through stage 4 chronic kidney disease, or unspecified chronic kidney disease: Secondary | ICD-10-CM | POA: Diagnosis not present

## 2023-08-10 LAB — BASIC METABOLIC PANEL WITH GFR
Anion gap: 11 (ref 5–15)
BUN: 23 mg/dL (ref 8–23)
CO2: 28 mmol/L (ref 22–32)
Calcium: 8.8 mg/dL — ABNORMAL LOW (ref 8.9–10.3)
Chloride: 96 mmol/L — ABNORMAL LOW (ref 98–111)
Creatinine, Ser: 1.17 mg/dL — ABNORMAL HIGH (ref 0.44–1.00)
GFR, Estimated: 43 mL/min — ABNORMAL LOW (ref >60)
Glucose, Bld: 102 mg/dL — ABNORMAL HIGH (ref 70–99)
Potassium: 3.8 mmol/L (ref 3.5–5.1)
Sodium: 135 mmol/L (ref 135–145)

## 2023-08-10 MED ORDER — ENOXAPARIN SODIUM 30 MG/0.3ML IJ SOSY: 30.0000 mg | PREFILLED_SYRINGE | INTRAMUSCULAR | Status: DC

## 2023-08-10 MED ORDER — FUROSEMIDE 20 MG PO TABS
20.0000 mg | ORAL_TABLET | Freq: Every day | ORAL | 0 refills | Status: DC
  Filled 2023-08-10: qty 10, 10d supply, fill #0

## 2023-08-10 MED ORDER — ONDANSETRON 4 MG PO TBDP
4.0000 mg | ORAL_TABLET | Freq: Three times a day (TID) | ORAL | 0 refills | Status: AC | PRN
  Filled 2023-08-10: qty 20, 7d supply, fill #0

## 2023-08-10 MED ORDER — LEVOTHYROXINE SODIUM 25 MCG PO TABS
12.5000 ug | ORAL_TABLET | Freq: Every day | ORAL | 2 refills | Status: AC
  Filled 2023-08-10: qty 15, 30d supply, fill #0

## 2023-08-10 MED ORDER — PANTOPRAZOLE SODIUM 40 MG PO TBEC
40.0000 mg | DELAYED_RELEASE_TABLET | Freq: Every day | ORAL | 0 refills | Status: AC
  Filled 2023-08-10: qty 90, 90d supply, fill #0

## 2023-08-10 NOTE — Progress Notes (Signed)
 PT Cancellation Note  Patient Details Name: Stephanie Foster MRN: [id number] DOB: [birth year]   Cancelled Treatment:    Reason Eval/Treat Not Completed: Fatigue/lethargy limiting ability to participate Patient declines, states that she has been up  a lot. States she does not want therapy because her HR goes up.  Will check back another time.  [name] PT Acute Rehabilitation Services Office [phone]  [name] 08/10/2023, 11:05 AM

## 2023-08-10 NOTE — Progress Notes (Signed)
 Pt discharged home with son in stable condition. DC instructions given. Pt verbalized understanding. No immediate questions or concerns at this time. Discharged from unit via wheelchair

## 2023-08-10 NOTE — Plan of Care (Signed)
  Problem: Education: Goal: Knowledge of General Education information will improve Description: Including pain rating scale, medication(s)/side effects and non-pharmacologic comfort measures Outcome: Completed/Met   Problem: Clinical Measurements: Goal: Ability to maintain clinical measurements within normal limits will improve Outcome: Completed/Met Goal: Will remain free from infection Outcome: Completed/Met Goal: Diagnostic test results will improve Outcome: Completed/Met Goal: Respiratory complications will improve Outcome: Completed/Met Goal: Cardiovascular complication will be avoided Outcome: Completed/Met   Problem: Activity: Goal: Risk for activity intolerance will decrease Outcome: Completed/Met   Problem: Coping: Goal: Level of anxiety will decrease Outcome: Completed/Met   Problem: Elimination: Goal: Will not experience complications related to bowel motility Outcome: Completed/Met Goal: Will not experience complications related to urinary retention Outcome: Completed/Met   Problem: Pain Managment: Goal: General experience of comfort will improve and/or be controlled Outcome: Completed/Met   Problem: Safety: Goal: Ability to remain free from injury will improve Outcome: Completed/Met   Problem: Skin Integrity: Goal: Risk for impaired skin integrity will decrease Outcome: Completed/Met

## 2023-08-10 NOTE — Evaluation (Signed)
 Occupational Therapy Evaluation Patient Details Name: Stephanie Foster MRN: [id number] DOB: 09-Jan-1925 Today's Date: 08/10/2023   History of Present Illness   Stephanie Foster is a 89 y.o. female with medical history significant of GERD, prior stroke, hypertension, CHF who presented to the emergency department due to shortness of breath and weakness.  Patient's developed symptoms approximately 5 days ago.  She was given Lasix  in outpatient setting but had persistent symptoms.  Her family brought her to the ER because she was unable to get a bed due to her worsening weakness and shortness of breath. At [facility] with CHF exacerbation.     Clinical Impressions PTA, patient lives with son and dil at home and uses AD at baseline for in home mobility and is mod I for BADL's and simple IADL's. Assist for groceries and transportation by son. Currently presents with decreased balance and activity tolerance impacting ADL's and mobility with VSR of increased HR. Began Energy conservation strategy training this session and will continue to follow while in Acute hospital setting. Patient reports she has all DME needed. Recommending family/caregiver assist and HHOT if patient agreeable upon discharge.     If plan is discharge home, recommend the following:   A little help with walking and/or transfers;A little help with bathing/dressing/bathroom;Assistance with cooking/housework;Assist for transportation;Help with stairs or ramp for entrance     Functional Status Assessment   Patient has had a recent decline in their functional status and demonstrates the ability to make significant improvements in function in a reasonable and predictable amount of time.     Equipment Recommendations   None recommended by OT      Precautions/Restrictions   Precautions Precautions: Fall Precaution/Restrictions Comments: watch HR Restrictions Weight Bearing Restrictions Per Provider Order: No     Mobility Bed  Mobility Overal bed mobility: Modified Independent             General bed mobility comments: HOB elevated    Transfers Overall transfer level: Needs assistance Equipment used: Rolling walker (2 wheels) Transfers: Sit to/from Stand, Bed to chair/wheelchair/BSC Sit to Stand: Supervision     Step pivot transfers: Supervision     General transfer comment: min cues for hand placement      Balance Overall balance assessment: Mild deficits observed, not formally tested (uses AD at baseline for steadying)                                         ADL either performed or assessed with clinical judgement   ADL Overall ADL's : Needs assistance/impaired Eating/Feeding: Independent   Grooming: Wash/dry hands;Wash/dry face;Oral care;Applying deodorant;Modified independent;Sitting   Upper Body Bathing: Modified independent;Sitting   Lower Body Bathing: Modified independent;Sit to/from stand   Upper Body Dressing : Modified independent;Sitting   Lower Body Dressing: Supervision/safety;Sit to/from stand   Toilet Transfer: Supervision/safety;Regular Toilet;Grab bars;Rolling walker (2 wheels)   Toileting- Clothing Manipulation and Hygiene: Modified independent;Sitting/lateral lean   Tub/ Shower Transfer: Contact guard assist;Rolling walker (2 wheels);Tub bench   Functional mobility during ADLs: Supervision/safety;Rolling walker (2 wheels) General ADL Comments: cues for pacing and breathing integration to allow rest d/t increased HR     Vision Baseline Vision/History: 2 Legally blind;6 Macular Degeneration Ability to See in Adequate Light: 1 Impaired Patient Visual Report: No change from baseline;Central vision impairment Vision Assessment?: Wears glasses for reading Additional Comments: able to read clock indep across  room with glasses            Pertinent Vitals/Pain Pain Assessment Pain Assessment: Faces Faces Pain Scale: No hurt      Extremity/Trunk Assessment Upper Extremity Assessment Upper Extremity Assessment: Left hand dominant;Generalized weakness (baseline B shoulder OA limitations)   Lower Extremity Assessment Lower Extremity Assessment: Generalized weakness   Cervical / Trunk Assessment Cervical / Trunk Assessment: Kyphotic   Communication Communication Communication: Impaired Factors Affecting Communication: Hearing impaired (hearing aides but not in hospital)   Cognition Arousal: Alert Behavior During Therapy: WFL for tasks assessed/performed Cognition: No apparent impairments                               Following commands: Intact       Cueing  General Comments   Cueing Techniques: Verbal cues  HR 96 at rest, SpO2 on RA 98%, HR 112 after light OT tasks with 2 min rest back to 96 bpm           Home Living Family/patient expects to be discharged to:: Private residence Living Arrangements: Children Available Help at Discharge: Available 24 hours/day;Family Type of Home: House Home Access: Stairs to enter Secretary/administrator of Steps: 2 Entrance Stairs-Rails: Left Home Layout: One level     Bathroom Shower/Tub: Tub/shower unit;Curtain   Bathroom Toilet: Handicapped height Bathroom Accessibility: Yes How Accessible: Accessible via walker Home Equipment: Rollator (4 wheels);Cane - single point;BSC/3in1;Tub bench;Hand held shower head;Adaptive equipment;Lift chair Adaptive Equipment: Reacher        Prior Functioning/Environment Prior Level of Function : Needs assist       Physical Assist : Mobility (physical);ADLs (physical) Mobility (physical): Stairs ADLs (physical): IADLs Mobility Comments: uses SPC and rollator in home, son assists with community amb ADLs Comments: does her own laundry and pet care, son assists with groceries and MD appt transport    OT Problem List: Decreased strength;Decreased activity tolerance;Impaired balance (sitting and/or  standing);Cardiopulmonary status limiting activity   OT Treatment/Interventions: Self-care/ADL training;Therapeutic exercise;Neuromuscular education;Energy conservation;DME and/or AE instruction;Therapeutic activities;Patient/family education;Balance training      OT Goals(Current goals can be found in the care plan section)   Acute Rehab OT Goals Patient Stated Goal: to go home OT Goal Formulation: With patient Time For Goal Achievement: 08/24/23 Potential to Achieve Goals: Good ADL Goals Pt Will Transfer to Toilet: with modified independence;ambulating;grab bars Pt Will Perform Tub/Shower Transfer: with supervision;ambulating;tub bench;Tub transfer Additional ADL Goal #1: Patient will teach back 3/4 ECTs with min cues for ADL's and mobility   OT Frequency:  Min 2X/week       AM-PAC OT 6 Clicks Daily Activity     Outcome Measure Help from another person eating meals?: None Help from another person taking care of personal grooming?: None Help from another person toileting, which includes using toliet, bedpan, or urinal?: A Little Help from another person bathing (including washing, rinsing, drying)?: A Little Help from another person to put on and taking off regular upper body clothing?: None Help from another person to put on and taking off regular lower body clothing?: A Little 6 Click Score: 21   End of Session Equipment Utilized During Treatment: Gait belt;Rolling walker (2 wheels) Nurse Communication: Mobility status  Activity Tolerance: Patient tolerated treatment well Patient left: in chair;with call bell/phone within reach;with chair alarm set  OT Visit Diagnosis: Unsteadiness on feet (R26.81)  Time: 9179-9151 OT Time Calculation (min): 28 min Charges:  OT General Charges $OT Visit: 1 Visit OT Evaluation $OT Eval Low Complexity: 1 Low OT Treatments $Self Care/Home Management : 8-22 mins Vallory Oetken OT/L Acute Rehabilitation Department  [phone]  08/10/2023, 11:03 AM

## 2023-08-10 NOTE — Discharge Summary (Signed)
 Physician Discharge Summary   Patient: Stephanie Foster MRN: [id number] DOB: 1924-04-21  Admit date:     08/08/2023  Discharge date: 08/10/23  Discharge Physician: Concepcion Riser   PCP: Duwayne Katheryn HERO, PA   Recommendations at discharge:    PCP follow up in 1 week.  Discharge Diagnoses: Principal Problem:   Acute on chronic diastolic CHF (congestive heart failure) (HCC) Active Problems:   Nausea   Late effect of cerebrovascular accident (CVA)   Hypertension   Nonexudative age-related macular degeneration, bilateral, early dry stage   Persistent atrial fibrillation (HCC)   Stage 3 chronic kidney disease (HCC)  Resolved Problems:   * No resolved hospital problems. *  Hospital Course: [name] is a 89 y.o. female with medical history significant of GERD, prior stroke, hypertension, CHF who presented to the emergency department due to shortness of breath and weakness.  She is admitted for further management and evaluation of CHF exacerbation.   Assessment and Plan: Acute on chronic diastolic CHF: She got IV lasix  2 doses with improvement of symptoms. Echo showed EF 60%, no significant change from prior study. She does feel nauseous, lightheaded which is not new. Has hearing impairment, legally blind. Advised Zofran  ODT PRN. Does not wish to stay in hospital, nor want HH for PT/ OT. Resumed low dose lasix  20mg  at home for next 10 days. Advised to follow with PCP regarding further management. Discussed with patient at bedside, son over phone. They understand and agree.   Hypokalemia- Potassium 3.3 due to diuresis. Got oral supplements. Monitor electrolytes and renal function check as outpatient.   Hypothyroidism-continue levothyroxine    GERD- Resumed Protonix    Paroxysmal A-fib- Her HR higher side mostly with exertion. She is not on anticoagulation due to her age, GI bleed per son. Resumed Diltiazem  120mg  daily.   Chronic anemia- Hemoglobin stable. No active  bleeding.        Consultants: none Procedures performed: none  Disposition: Home Diet recommendation:  Cardiac diet DISCHARGE MEDICATION: Allergies as of 08/10/2023       Reactions   Ciprofloxacin Hives, Itching   Codeine Nausea Only   Diovan [valsartan] Other (See Comments)   makes me weak   Flagyl [metronidazole] Diarrhea, Nausea And Vomiting   Aspirin  Palpitations   Dextromethorphan-guaifenesin Other (See Comments)   Muscle Weakness   Macrobid [nitrofurantoin Macrocrystal] Nausea Only   Phenylalanine Other (See Comments)   heart felt like swelling, top of head felt like blood pressure coming out of top   Prednisone Other (See Comments)   makes me feel so weak that I can't walk across the floor   Sulfa Antibiotics Nausea Only   Vitasana [actical] Other (See Comments)   tired, arms and legs felt like going down        Medication List     TAKE these medications    atorvastatin  20 MG tablet Commonly known as: LIPITOR Take 20 mg by mouth daily.   Cranberry-Vitamin C-Vitamin E 4200-20-3 MG-MG-UNIT Caps Take 1 tablet by mouth daily.   diltiazem  120 MG 24 hr capsule Commonly known as: CARDIZEM  CD TAKE 1 CAPSULE(120 MG) BY MOUTH TWICE DAILY What changed: See the new instructions.   furosemide  20 MG tablet Commonly known as: LASIX  Take 1 tablet (20 mg total) by mouth daily for 10 days.   iron  polysaccharides 150 MG capsule Commonly known as: NIFEREX Take 150 mg by mouth every other day.   ondansetron  4 MG disintegrating tablet Commonly known as: ZOFRAN -ODT Take 1  tablet (4 mg total) by mouth every 8 (eight) hours as needed for nausea or vomiting.   pantoprazole  40 MG tablet Commonly known as: Protonix  Take 1 tablet (40 mg total) by mouth daily.   Synthroid  25 MCG tablet Generic drug: levothyroxine  Take 0.5 tablets (12.5 mcg total) by mouth daily before breakfast.   Vitamin D (Ergocalciferol) 1.25 MG (50000 UNIT) Caps capsule Commonly known as:  DRISDOL Take 50,000 Units by mouth once a week.        Discharge Exam: Filed Weights   08/08/23 1349  Weight: 65.8 kg      08/10/2023   12:31 PM 08/10/2023    3:15 AM 08/09/2023    7:43 PM  Vitals with BMI  Systolic 106 117 888  Diastolic 59 67 78  Pulse 88 59 92   General - Elderly Caucasian female, no apparent distress HEENT - PERRLA, EOMI, atraumatic head, non tender sinuses. Lung - Clear, basal rales, rhonchi, wheezes. Heart - S1, S2 heard, no murmurs, rubs, trace pedal edema. Abdomen - Soft, non tender, bowel sounds good Neuro - Alert, awake and oriented, non focal exam. Skin - Warm and dry.  Condition at discharge: stable  The results of significant diagnostics from this hospitalization (including imaging, microbiology, ancillary and laboratory) are listed below for reference.   Imaging Studies: ECHOCARDIOGRAM COMPLETE Result Date: 08/09/2023    ECHOCARDIOGRAM REPORT   Patient Name:   Stephanie Foster Date of Exam: 08/09/2023 Medical Rec #:  [id number]    Height:       60.0 in Accession #:    [id number]   Weight:       145.0 lb Date of Birth:  01/09/1925    BSA:          1.628 m Patient Age:    89 years     BP:           111/65 mmHg Patient Gender: F            HR:           119 bpm. Exam Location:  Inpatient Procedure: 2D Echo, Cardiac Doppler, Color Doppler and Intracardiac            Opacification Agent (Both Spectral and Color Flow Doppler were            utilized during procedure). Indications:    CHF  History:        Patient has prior history of Echocardiogram examinations. CHF;                 Risk Factors:Hypertension.  Sonographer:    Vella Key Referring Phys: [id number] ROBERT DORRELL IMPRESSIONS  1. Left ventricular ejection fraction, by estimation, is 55 to 60%. The left ventricle has normal function. The left ventricle has no regional wall motion abnormalities. There is mild concentric left ventricular hypertrophy. Left ventricular diastolic parameters are indeterminate.   2. Right ventricular systolic function is normal. The right ventricular size is normal.  3. Left atrial size was moderately dilated.  4. The mitral valve is grossly normal. Trivial mitral valve regurgitation. No evidence of mitral stenosis.  5. The aortic valve is tricuspid. Aortic valve regurgitation is not visualized. No aortic stenosis is present. Comparison(s): No significant change from prior study. Prior images reviewed side by side. FINDINGS  Left Ventricle: Left ventricular ejection fraction, by estimation, is 55 to 60%. The left ventricle has normal function. The left ventricle has no regional wall motion abnormalities. The left ventricular internal cavity  size was small. There is mild concentric left ventricular hypertrophy. Left ventricular diastolic parameters are indeterminate. Right Ventricle: The right ventricular size is normal. No increase in right ventricular wall thickness. Right ventricular systolic function is normal. Left Atrium: Left atrial size was moderately dilated. Right Atrium: Right atrial size was normal in size. Pericardium: There is no evidence of pericardial effusion. Mitral Valve: The mitral valve is grossly normal. Trivial mitral valve regurgitation. No evidence of mitral valve stenosis. Tricuspid Valve: The tricuspid valve is normal in structure. Tricuspid valve regurgitation is not demonstrated. No evidence of tricuspid stenosis. Aortic Valve: The aortic valve is tricuspid. Aortic valve regurgitation is not visualized. No aortic stenosis is present. Pulmonic Valve: The pulmonic valve was not well visualized. Pulmonic valve regurgitation is not visualized. No evidence of pulmonic stenosis. Aorta: The aortic root and ascending aorta are structurally normal, with no evidence of dilitation. IAS/Shunts: The atrial septum is grossly normal.  LEFT VENTRICLE PLAX 2D LVIDd:         3.20 cm LVIDs:         2.30 cm LV PW:         1.20 cm LV IVS:        1.20 cm LVOT diam:     1.70 cm LV SV:          16 LV SV Index:   10 LVOT Area:     2.27 cm  LV Volumes (MOD) LV vol d, MOD A2C: 33.6 ml LV vol d, MOD A4C: 52.6 ml LV vol s, MOD A2C: 15.3 ml LV vol s, MOD A4C: 23.5 ml LV SV MOD A2C:     18.3 ml LV SV MOD A4C:     52.6 ml LV SV MOD BP:      24.0 ml RIGHT VENTRICLE RV Basal diam:  2.60 cm RV S prime:     11.80 cm/s TAPSE (M-mode): 1.5 cm LEFT ATRIUM             Index        RIGHT ATRIUM           Index LA diam:        3.00 cm 1.84 cm/m   RA Area:     17.30 cm LA Vol (A2C):   80.1 ml 49.20 ml/m  RA Volume:   37.30 ml  22.91 ml/m LA Vol (A4C):   64.0 ml 39.31 ml/m LA Biplane Vol: 74.3 ml 45.63 ml/m  AORTIC VALVE LVOT Vmax:   54.80 cm/s LVOT Vmean:  38.600 cm/s LVOT VTI:    0.069 m  AORTA Ao Root diam: 3.50 cm Ao Asc diam:  3.50 cm TRICUSPID VALVE TV Peak grad:   23.4 mmHg TV Vmax:        2.42 m/s TR Peak grad:   23.4 mmHg TR Vmax:        242.00 cm/s  SHUNTS Systemic VTI:  0.07 m Systemic Diam: 1.70 cm Stanly Leavens MD Electronically signed by Stanly Leavens MD Signature Date/Time: 08/09/2023/11:26:24 AM    Final    DG Chest Port 1 View Result Date: 08/08/2023 CLINICAL DATA:  Shortness of breath. EXAM: PORTABLE CHEST 1 VIEW COMPARISON:  08/03/2023. FINDINGS: Low lung volumes. Cardiomediastinal silhouette is unchanged. Large hiatal hernia. Aortic atherosclerosis. Persistent central pulmonary vascular congestion with mild bilateral interstitial prominence, which could reflect bronchovascular crowding or interstitial edema. Similar blunting of the left costophrenic angle with left basilar opacity favored to reflect atelectasis. Visualized osseous structures are unchanged. IMPRESSION: 1. Low  lung volumes. Persistent central pulmonary vascular congestion with mild bilateral interstitial prominence, which could reflect bronchovascular crowding or interstitial edema. 2. Similar blunting of the left costophrenic angle with left basilar opacity favored to reflect atelectasis. 3. Large hiatal hernia.  Electronically Signed   By: Harrietta Sherry M.D.   On: 08/08/2023 15:15   DG Chest 2 View Result Date: 08/03/2023 CLINICAL DATA:  Shortness of breath. EXAM: CHEST - 2 VIEW COMPARISON:  01/07/2023. FINDINGS: The heart size and mediastinal contours are within normal limits. Aortic atherosclerosis. Large hiatal hernia is again noted. Pulmonary vascular congestion. Left basilar opacity with blunting of the left costophrenic angle is favored to reflect atelectasis with possible small left pleural effusion. No pneumothorax. Diffuse osseous demineralization. Exaggerated thoracic kyphosis. No acute osseous abnormality. IMPRESSION: 1. Left basilar opacity with blunting of the left costophrenic angle is favored to reflect atelectasis with possible small left pleural effusion. 2. Pulmonary vascular congestion. 3. Large hiatal hernia. Electronically Signed   By: Harrietta Sherry M.D.   On: 08/03/2023 11:32    Microbiology: Results for orders placed or performed during the hospital encounter of 01/26/23  Urine Culture     Status: None   Collection Time: 01/26/23  2:32 PM   Specimen: Urine, Clean Catch  Result Value Ref Range Status   Specimen Description   Final    URINE, CLEAN CATCH Performed at [facility], [address] [place], [place] [address]    Special Requests   Final    NONE Performed at [facility], [address] [place], [place] [address]    Culture   Final    NO GROWTH Performed at [facility], [address], [place], [place] [address]    Report Status 01/27/2023 FINAL  Final    Labs: CBC: Recent Labs  Lab 08/08/23 1353 08/08/23 1445 08/09/23 0349  WBC 4.2  --  5.3  HGB 9.8* 10.9* 11.0*  HCT 33.8* 32.0* 37.3  MCV 68.8*  --  68.1*  PLT 217  --  213   Basic Metabolic Panel: Recent Labs  Lab 08/08/23 1353 08/08/23 1445 08/09/23 0349 08/10/23 0049  NA 142 142 140 135  K 3.8 3.9 3.3* 3.8  CL 105  --  100 96*  CO2 25  --  28 28   GLUCOSE 143*  --  101* 102*  BUN 20  --  22 23  CREATININE 0.93  --  0.88 1.17*  CALCIUM  9.0  --  8.9 8.8*  MG  --   --  1.9  --    Liver Function Tests: Recent Labs  Lab 08/08/23 1353  AST 19  ALT 11  ALKPHOS 150*  BILITOT 0.3  PROT 6.3*  ALBUMIN 3.9   CBG: Recent Labs  Lab 08/08/23 1355  GLUCAP 157*    Discharge time spent: 34 minutes.  Signed: Concepcion Riser, MD [facility] 08/10/2023

## 2023-08-10 NOTE — TOC Transition Note (Signed)
 Transition of Care [facility]) - Discharge Note  Patient Details  Name: Stephanie Foster MRN: [id number] Date of Birth: [birth year]  Transition of [facility]) CM/SW Contact:  [name], LCSW Phone Number: 08/10/2023, 12:53 PM  Clinical Narrative: PT/OT evaluations recommended HH. However, patient has declined HH at this time. TOC signing off.  Final next level of care: Home/Self Care Barriers to Discharge: Barriers Resolved  Patient Goals and CMS Choice Patient states their goals for this hospitalization and ongoing recovery are:: Return home  Discharge Plan and Services Additional resources added to the After Visit Summary for   In-house Referral: Clinical Social Work    DME Arranged: N/A DME Agency: NA HH Arranged: Patient Refused HH  Social Drivers of Health (SDOH) Interventions SDOH Screenings   Food Insecurity: No Food Insecurity (08/08/2023)  Housing: Low Risk  (08/08/2023)  Transportation Needs: No Transportation Needs (08/08/2023)  Utilities: Not At Risk (08/08/2023)  Social Connections: Moderately Isolated (08/08/2023)  Tobacco Use: Low Risk  (08/08/2023)   Readmission Risk Interventions     No data to display

## 2023-08-10 NOTE — Progress Notes (Signed)
 Medications delivered to patient.

## 2023-08-11 ENCOUNTER — Other Ambulatory Visit (HOSPITAL_COMMUNITY): Payer: Self-pay

## 2023-08-16 ENCOUNTER — Ambulatory Visit: Attending: Cardiology | Admitting: Cardiology

## 2023-08-16 ENCOUNTER — Encounter: Payer: Self-pay | Admitting: Cardiology

## 2023-08-16 VITALS — BP 110/60 | HR 115 | Ht 60.0 in | Wt 143.6 lb

## 2023-08-16 DIAGNOSIS — I10 Essential (primary) hypertension: Secondary | ICD-10-CM | POA: Insufficient documentation

## 2023-08-16 DIAGNOSIS — I509 Heart failure, unspecified: Secondary | ICD-10-CM | POA: Diagnosis present

## 2023-08-16 DIAGNOSIS — R0609 Other forms of dyspnea: Secondary | ICD-10-CM | POA: Insufficient documentation

## 2023-08-16 DIAGNOSIS — I480 Paroxysmal atrial fibrillation: Secondary | ICD-10-CM | POA: Insufficient documentation

## 2023-08-16 MED ORDER — FUROSEMIDE 20 MG PO TABS
20.0000 mg | ORAL_TABLET | Freq: Every day | ORAL | 3 refills | Status: DC
Start: 2023-08-16 — End: 2023-10-13

## 2023-08-16 NOTE — Progress Notes (Signed)
 Cardiology Office Note:    Date:  08/16/2023   ID:  Stephanie Foster, DOB [birth year], MRN [id number]  PCP:  Duwayne Katheryn HERO, PA  Cardiologist:  Lamar Fitch, MD    Referring MD: Duwayne Katheryn HERO, [place]   Chief Complaint  Patient presents with   Hospitalization Follow-up    History of Present Illness:    Stephanie Foster is a 89 y.o. female past medical history significant for paroxysmal atrial fibrillation, essential hypertension, GERD, history of CVA.  Recently she ended up being in a hospital because of fluid overload.  She was given some diuretic with good response at home that she came back with some atypical symptoms she was found to be hypokalemic that being supplemented since that time she seems to be doing fine.  Complain of being weak fatigued tired.  She was also noted to be in atrial fibrillation while she was in the hospital.  However she is not anticoagulant because of frequent fall and head trauma.  Past Medical History:  Diagnosis Date   Cataract    Chest pain 03/24/2012   Diaphoresis 03/24/2012   Diverticulosis    Dry eye syndrome of both lacrimal glands 11/16/2017   Dyslipidemia 02/28/2020   Dyspnea on exertion 02/28/2020   Essential hypertension 02/28/2020   GERD (gastroesophageal reflux disease)    H/O hiatal hernia    Hernia    Hypertension    Late effect of cerebrovascular accident (CVA) 02/28/2020   Nausea 03/24/2012   Nonexudative age-related macular degeneration, bilateral, early dry stage 11/16/2017   Pseudophakia of both eyes 11/16/2017   Stroke (HCC) 02/22/2020    Past Surgical History:  Procedure Laterality Date   ABDOMINAL HYSTERECTOMY     BLADDER SUSPENSION     EYE SURGERY     TONSILLECTOMY      Current Medications: Current Meds  Medication Sig   atorvastatin  (LIPITOR) 20 MG tablet Take 20 mg by mouth daily.   Cranberry-Vitamin C-Vitamin E 4200-20-3 MG-MG-UNIT CAPS Take 1 tablet by mouth daily.   diltiazem  (CARDIZEM  CD) 120 MG 24 hr capsule TAKE 1 CAPSULE(120  MG) BY MOUTH TWICE DAILY (Patient taking differently: Take 120 mg by mouth in the morning and at bedtime.)   furosemide  (LASIX ) 20 MG tablet Take 1 tablet (20 mg total) by mouth daily for 10 days.   iron  polysaccharides (NIFEREX) 150 MG capsule Take 150 mg by mouth every other day.   levothyroxine  (SYNTHROID ) 25 MCG tablet Take 0.5 tablets (12.5 mcg total) by mouth daily before breakfast.   ondansetron  (ZOFRAN -ODT) 4 MG disintegrating tablet Take 1 tablet (4 mg total) by mouth every 8 (eight) hours as needed for nausea or vomiting.   pantoprazole  (PROTONIX ) 40 MG tablet Take 1 tablet (40 mg total) by mouth daily.   Vitamin D, Ergocalciferol, (DRISDOL) 1.25 MG (50000 UNIT) CAPS capsule Take 50,000 Units by mouth once a week.     Allergies:   Ciprofloxacin, Codeine, Diovan [valsartan], Flagyl [metronidazole], Aspirin , Dextromethorphan-guaifenesin, Macrobid [nitrofurantoin macrocrystal], Phenylalanine, Prednisone, Sulfa antibiotics, and Vitasana [actical]   Social History   Socioeconomic History   Marital status: Widowed    Spouse name: Not on file   Number of children: Not on file   Years of education: Not on file   Highest education level: Not on file  Occupational History   Not on file  Tobacco Use   Smoking status: Never   Smokeless tobacco: Never  Vaping Use   Vaping status: Never Used  Substance and Sexual  Activity   Alcohol use: No   Drug use: No   Sexual activity: Not Currently  Other Topics Concern   Not on file  Social History Narrative   Not on file   Social Drivers of Health   Financial Resource Strain: Not on file  Food Insecurity: No Food Insecurity (08/08/2023)   Hunger Vital Sign    Worried About Running Out of Food in the Last Year: Never true    Ran Out of Food in the Last Year: Never true  Transportation Needs: No Transportation Needs (08/08/2023)   PRAPARE - Administrator, Civil Service (Medical): No    Lack of Transportation (Non-Medical): No   Physical Activity: Not on file  Stress: Not on file  Social Connections: Moderately Isolated (08/08/2023)   Social Connection and Isolation Panel    Frequency of Communication with Friends and Family: More than three times a week    Frequency of Social Gatherings with Friends and Family: More than three times a week    Attends Religious Services: Never    Database administrator or Organizations: No    Attends Banker Meetings: 1 to 4 times per year    Marital Status: Widowed     Family History: The patient's family history includes Coronary artery disease in her brother and father. ROS:   Please see the history of present illness.    All 14 point review of systems negative except as described per history of present illness  EKGs/Labs/Other Studies Reviewed:         Recent Labs: 08/08/2023: ALT 11; Pro Brain Natriuretic Peptide 512.0; TSH 3.990 08/09/2023: Hemoglobin 11.0; Magnesium 1.9; Platelets 213 08/10/2023: BUN 23; Creatinine, Ser 1.17; Potassium 3.8; Sodium 135  Recent Lipid Panel    Component Value Date/Time   CHOL 179 02/24/2020 0154   TRIG 51 02/24/2020 0154   HDL 70 02/24/2020 0154   CHOLHDL 2.6 02/24/2020 0154   VLDL 10 02/24/2020 0154   LDLCALC 99 02/24/2020 0154    Physical Exam:    VS:  BP 110/60 (BP Location: Left Arm, Patient Position: Sitting)   Pulse (!) 115   Ht 5' (1.524 m)   Wt 143 lb 9.6 oz (65.1 kg)   SpO2 94%   BMI 28.04 kg/m     Wt Readings from Last 3 Encounters:  08/16/23 143 lb 9.6 oz (65.1 kg)  08/08/23 145 lb (65.8 kg)  08/03/23 151 lb 7.3 oz (68.7 kg)     GEN:  Well nourished, well developed in no acute distress HEENT: Normal NECK: No JVD; No carotid bruits LYMPHATICS: No lymphadenopathy CARDIAC: Irregular, no murmurs, no rubs, no gallops RESPIRATORY:  Clear to auscultation without rales, wheezing or rhonchi  ABDOMEN: Soft, non-tender, non-distended MUSCULOSKELETAL:  No edema; No deformity  SKIN: Warm and  dry LOWER EXTREMITIES: no swelling NEUROLOGIC:  Alert and oriented x 3 PSYCHIATRIC:  Normal affect   ASSESSMENT:    1. Paroxysmal atrial fibrillation (HCC)   2. Essential hypertension   3. Acute on chronic congestive heart failure, unspecified heart failure type (HCC)    PLAN:    In order of problems listed above:  Paroxysmal atrial fibrillation now seems to be in persistent atrial fibrillation.  Rate is reasonably controlled she is not anticoagulated because of frequent fall and head trauma, she is poor candidate for Watchman device because of fragile state and advanced age.  Continue monitoring. Essential hypertension blood pressure well-controlled. Congestive heart failure seems to be  compensated on physical exam we will check proBNP as well as Chem-7.  In the meantime continue with minimum of Lasix  every single day Record from hospital reviewed 40-minute visit   Medication Adjustments/Labs and Tests Ordered: Current medicines are reviewed at length with the patient today.  Concerns regarding medicines are outlined above.  Orders Placed This Encounter  Procedures   EKG 12-Lead   Medication changes: No orders of the defined types were placed in this encounter.   Signed, Lamar DOROTHA Fitch, MD, [facility] 08/16/2023 4:15 PM    [facility]

## 2023-08-16 NOTE — Patient Instructions (Signed)
Medication Instructions:  Your physician recommends that you continue on your current medications as directed. Please refer to the Current Medication list given to you today.  *If you need a refill on your cardiac medications before your next appointment, please call your pharmacy*   Lab Work: BMP, ProBNP- today If you have labs (blood work) drawn today and your tests are completely normal, you will receive your results only by: MyChart Message (if you have MyChart) OR A paper copy in the mail If you have any lab test that is abnormal or we need to change your treatment, we will call you to review the results.   Testing/Procedures: None Ordered   Follow-Up: At [facility], you and your health needs are our priority.  As part of our continuing mission to provide you with exceptional heart care, we have created designated Provider Care Teams.  These Care Teams include your primary Cardiologist (physician) and Advanced Practice Providers (APPs -  Physician Assistants and Nurse Practitioners) who all work together to provide you with the care you need, when you need it.  We recommend signing up for the patient portal called "MyChart".  Sign up information is provided on this After Visit Summary.  MyChart is used to connect with patients for Virtual Visits (Telemedicine).  Patients are able to view lab/test results, encounter notes, upcoming appointments, etc.  Non-urgent messages can be sent to your provider as well.   To learn more about what you can do with MyChart, go to [email or web address].com.    Your next appointment:   1 month(s)  The format for your next appointment:   In Person  Provider:   Robert Krasowski, MD    Other Instructions NA  

## 2023-08-16 NOTE — Addendum Note (Signed)
 Addended by: ARLOA PLANAS D on: 08/16/2023 04:30 PM   Modules accepted: Orders

## 2023-08-17 LAB — BASIC METABOLIC PANEL WITH GFR
BUN/Creatinine Ratio: 22 (ref 12–28)
BUN: 24 mg/dL (ref 10–36)
CO2: 25 mmol/L (ref 20–29)
Calcium: 9.3 mg/dL (ref 8.7–10.3)
Chloride: 101 mmol/L (ref 96–106)
Creatinine, Ser: 1.09 mg/dL — ABNORMAL HIGH (ref 0.57–1.00)
Glucose: 110 mg/dL — ABNORMAL HIGH (ref 70–99)
Potassium: 4.2 mmol/L (ref 3.5–5.2)
Sodium: 143 mmol/L (ref 134–144)
eGFR: 46 mL/min/{1.73_m2} — ABNORMAL LOW (ref >59)

## 2023-08-17 LAB — PRO B NATRIURETIC PEPTIDE: NT-Pro BNP: 592 pg/mL (ref 0–738)

## 2023-08-18 ENCOUNTER — Ambulatory Visit: Payer: Self-pay | Admitting: Cardiology

## 2023-08-20 ENCOUNTER — Other Ambulatory Visit: Payer: Self-pay | Admitting: Cardiology

## 2023-08-23 ENCOUNTER — Telehealth: Payer: Self-pay

## 2023-08-23 NOTE — Telephone Encounter (Signed)
 Lab Results reviewed with pt's son per DPR as per Dr. Karry note.  Pt verbalized understanding and had no additional questions. Routed to PCP

## 2023-09-01 ENCOUNTER — Ambulatory Visit: Admitting: Cardiology

## 2023-09-12 ENCOUNTER — Ambulatory Visit: Admitting: Cardiology

## 2023-10-13 ENCOUNTER — Encounter: Payer: Self-pay | Admitting: Cardiology

## 2023-10-13 ENCOUNTER — Ambulatory Visit: Attending: Cardiology | Admitting: Cardiology

## 2023-10-13 VITALS — BP 106/74 | HR 104 | Ht 60.0 in | Wt 148.0 lb

## 2023-10-13 DIAGNOSIS — I4819 Other persistent atrial fibrillation: Secondary | ICD-10-CM | POA: Insufficient documentation

## 2023-10-13 DIAGNOSIS — I480 Paroxysmal atrial fibrillation: Secondary | ICD-10-CM | POA: Diagnosis not present

## 2023-10-13 DIAGNOSIS — I509 Heart failure, unspecified: Secondary | ICD-10-CM | POA: Diagnosis present

## 2023-10-13 DIAGNOSIS — I6930 Unspecified sequelae of cerebral infarction: Secondary | ICD-10-CM | POA: Insufficient documentation

## 2023-10-13 MED ORDER — FUROSEMIDE 20 MG PO TABS: 20.0000 mg | ORAL_TABLET | Freq: Every day | ORAL | 0 refills | Status: AC

## 2023-10-13 MED ORDER — POTASSIUM CHLORIDE ER 10 MEQ PO TBCR: 10.0000 meq | EXTENDED_RELEASE_TABLET | Freq: Every day | ORAL | 0 refills | Status: AC

## 2023-10-13 NOTE — Addendum Note (Signed)
 Addended by: ARLOA PLANAS D on: 10/13/2023 05:03 PM   Modules accepted: Orders

## 2023-10-13 NOTE — Progress Notes (Signed)
 Cardiology Office Note:    Date:  10/13/2023   ID:  Stephanie, Foster 1924/09/24, MRN [id number]  PCP:  Stephanie Katheryn HERO, PA  Cardiologist:  Stephanie Fitch, MD    Referring MD: Stephanie Foster, Stephanie Foster   No chief complaint on file.   History of Present Illness:    Stephanie Foster is a 89 y.o. female past medical history significant for paroxysmal atrial fibrillation, now look like permanent, essential hypertension, GERD, history of CVA.  Comes today 2 months for follow-up.  Much of the time she is doing well but complain of being weak tired exhausted.  I have to admit she still look pretty good for somebody her age she is more than 89 years old.  No chest pain tightness squeezing pressure burning chest  Past Medical History:  Diagnosis Date   Cataract    Chest pain 03/24/2012   Diaphoresis 03/24/2012   Diverticulosis    Dry eye syndrome of both lacrimal glands 11/16/2017   Dyslipidemia 02/28/2020   Dyspnea on exertion 02/28/2020   Essential hypertension 02/28/2020   GERD (gastroesophageal reflux disease)    H/O hiatal hernia    Hernia    Hypertension    Late effect of cerebrovascular accident (CVA) 02/28/2020   Nausea 03/24/2012   Nonexudative age-related macular degeneration, bilateral, early dry stage 11/16/2017   Pseudophakia of both eyes 11/16/2017   Stroke (HCC) 02/22/2020    Past Surgical History:  Procedure Laterality Date   ABDOMINAL HYSTERECTOMY     BLADDER SUSPENSION     EYE SURGERY     TONSILLECTOMY      Current Medications: Current Meds  Medication Sig   atorvastatin  (LIPITOR) 20 MG tablet Take 20 mg by mouth daily.   Cranberry-Vitamin C-Vitamin E 4200-20-3 MG-MG-UNIT CAPS Take 1 tablet by mouth daily.   diltiazem  (CARDIZEM  CD) 120 MG 24 hr capsule TAKE 1 CAPSULE(120 MG) BY MOUTH TWICE DAILY   furosemide  (LASIX ) 20 MG tablet Take 1 tablet (20 mg total) by mouth daily for 10 days.   levothyroxine  (SYNTHROID ) 25 MCG tablet Take 0.5 tablets (12.5 mcg total) by mouth daily before  breakfast.   ondansetron  (ZOFRAN -ODT) 4 MG disintegrating tablet Take 1 tablet (4 mg total) by mouth every 8 (eight) hours as needed for nausea or vomiting.   pantoprazole  (PROTONIX ) 40 MG tablet Take 1 tablet (40 mg total) by mouth daily.     Allergies:   Ciprofloxacin, Codeine, Diovan [valsartan], Flagyl [metronidazole], Aspirin , Dextromethorphan-guaifenesin, Macrobid [nitrofurantoin macrocrystal], Phenylalanine, Prednisone, Sulfa antibiotics, and Vitasana [actical]   Social History   Socioeconomic History   Marital status: Widowed    Spouse name: Not on file   Number of children: Not on file   Years of education: Not on file   Highest education level: Not on file  Occupational History   Not on file  Tobacco Use   Smoking status: Never   Smokeless tobacco: Never  Vaping Use   Vaping status: Never Used  Substance and Sexual Activity   Alcohol use: No   Drug use: No   Sexual activity: Not Currently  Other Topics Concern   Not on file  Social History Narrative   Not on file   Social Drivers of Health   Financial Resource Strain: Not on file  Food Insecurity: No Food Insecurity (08/08/2023)   Hunger Vital Sign    Worried About Running Out of Food in the Last Year: Never true    Ran Out of Food in the  Last Year: Never true  Transportation Needs: No Transportation Needs (08/08/2023)   PRAPARE - Administrator, Civil Service (Medical): No    Lack of Transportation (Non-Medical): No  Physical Activity: Not on file  Stress: Not on file  Social Connections: Moderately Isolated (08/08/2023)   Social Connection and Isolation Panel    Frequency of Communication with Friends and Family: More than three times a week    Frequency of Social Gatherings with Friends and Family: More than three times a week    Attends Religious Services: Never    Database administrator or Organizations: No    Attends Banker Meetings: 1 to 4 times per year    Marital Status:  Widowed     Family History: The patient's family history includes Coronary artery disease in her brother and father. ROS:   Please see the history of present illness.    All 14 point review of systems negative except as described per history of present illness  EKGs/Labs/Other Studies Reviewed:    EKG Interpretation Date/Time:  Thursday October 13 2023 16:24:25 EDT Ventricular Rate:  104 PR Interval:    QRS Duration:  76 QT Interval:  292 QTC Calculation: 383 R Axis:   -33  Text Interpretation: Atrial fibrillation with rapid ventricular response Left axis deviation Pulmonary disease pattern Septal infarct (cited on or before 08-Aug-2023) Inferior infarct (cited on or before 08-Aug-2023) When compared with ECG of 16-Aug-2023 15:55, No significant change was found Confirmed by Stephanie Foster [phone]) on 10/13/2023 4:33:08 PM    Recent Labs: 08/08/2023: ALT 11; TSH 3.990 08/09/2023: Hemoglobin 11.0; Magnesium 1.9; Platelets 213 08/16/2023: BUN 24; Creatinine, Ser 1.09; NT-Pro BNP 592; Potassium 4.2; Sodium 143  Recent Lipid Panel    Component Value Date/Time   CHOL 179 02/24/2020 0154   TRIG 51 02/24/2020 0154   HDL 70 02/24/2020 0154   CHOLHDL 2.6 02/24/2020 0154   VLDL 10 02/24/2020 0154   LDLCALC 99 02/24/2020 0154    Physical Exam:    VS:  BP 106/74   Pulse (!) 104   Ht 5' (1.524 m)   Wt 148 lb (67.1 kg)   SpO2 95%   BMI 28.90 kg/m     Wt Readings from Last 3 Encounters:  10/13/23 148 lb (67.1 kg)  08/16/23 143 lb 9.6 oz (65.1 kg)  08/08/23 145 lb (65.8 kg)     GEN:  Well nourished, well developed in no acute distress HEENT: Normal NECK: No JVD; No carotid bruits LYMPHATICS: No lymphadenopathy CARDIAC: Irregular, no murmurs, no rubs, no gallops RESPIRATORY:  Clear to auscultation without rales, wheezing or rhonchi  ABDOMEN: Soft, non-tender, non-distended MUSCULOSKELETAL:  No edema; No deformity  SKIN: Warm and dry LOWER EXTREMITIES: no  swelling NEUROLOGIC:  Alert and oriented x 3 PSYCHIATRIC:  Normal affect   ASSESSMENT:    1. Paroxysmal atrial fibrillation (HCC)   2. Persistent atrial fibrillation (HCC)   3. Acute on chronic congestive heart failure, unspecified heart failure type (HCC)   4. Late effect of cerebrovascular accident (CVA)    PLAN:    In order of problems listed above:  Atrial fibrillation persistent continue, not anticoagulated secondary to frequent falls.  Continue monitoring noted candidate for cardioversion not a candidate for Watchman device. Congestive heart failure.  Minimally and compensated today I will give her prescription for 20 mg of Lasix  with 10 mg potassium just for 2 days. Late effect of CVA.  Noted.   Medication Adjustments/Labs  and Tests Ordered: Current medicines are reviewed at length with the patient today.  Concerns regarding medicines are outlined above.  Orders Placed This Encounter  Procedures   EKG 12-Lead   Medication changes: No orders of the defined types were placed in this encounter.   Signed, Stephanie DOROTHA Fitch, MD, [facility] 10/13/2023 4:45 PM    [facility]

## 2023-10-13 NOTE — Patient Instructions (Addendum)
 Medication Instructions:    START: Lasix  20mg  daily x 2 days  START: Potassium 10mEq daily x 2 days   Lab Work: None Ordered If you have labs (blood work) drawn today and your tests are completely normal, you will receive your results only by: MyChart Message (if you have MyChart) OR A paper copy in the mail If you have any lab test that is abnormal or we need to change your treatment, we will call you to review the results.   Testing/Procedures: None Ordered   Follow-Up: At [facility], you and your health needs are our priority.  As part of our continuing mission to provide you with exceptional heart care, we have created designated Provider Care Teams.  These Care Teams include your primary Cardiologist (physician) and Advanced Practice Providers (APPs -  Physician Assistants and Nurse Practitioners) who all work together to provide you with the care you need, when you need it.  We recommend signing up for the patient portal called MyChart.  Sign up information is provided on this After Visit Summary.  MyChart is used to connect with patients for Virtual Visits (Telemedicine).  Patients are able to view lab/test results, encounter notes, upcoming appointments, etc.  Non-urgent messages can be sent to your provider as well.   To learn more about what you can do with MyChart, go to [email or web address].    Your next appointment:   4 month(s)  The format for your next appointment:   In Person  Provider:   Lamar Fitch, MD    Other Instructions NA

## 2023-10-22 ENCOUNTER — Other Ambulatory Visit: Payer: Self-pay | Admitting: Cardiology

## 2023-11-15 ENCOUNTER — Other Ambulatory Visit (HOSPITAL_COMMUNITY): Payer: Self-pay

## 2024-05-01 ENCOUNTER — Ambulatory Visit: Admitting: Cardiology
